# Patient Record
Sex: Female | Born: 1990 | Race: White | Hispanic: No | Marital: Single | Smoking: Current every day smoker
Health system: Southern US, Community
[De-identification: ages and names within clinical notes are randomized; demographics above are authoritative.]

## PROBLEM LIST (undated history)

## (undated) ENCOUNTER — Inpatient Hospital Stay (HOSPITAL_COMMUNITY): Payer: Self-pay

## (undated) DIAGNOSIS — Z9889 Other specified postprocedural states: Secondary | ICD-10-CM

## (undated) DIAGNOSIS — R112 Nausea with vomiting, unspecified: Secondary | ICD-10-CM

## (undated) DIAGNOSIS — N289 Disorder of kidney and ureter, unspecified: Secondary | ICD-10-CM

## (undated) DIAGNOSIS — R87629 Unspecified abnormal cytological findings in specimens from vagina: Secondary | ICD-10-CM

## (undated) DIAGNOSIS — F429 Obsessive-compulsive disorder, unspecified: Secondary | ICD-10-CM

## (undated) DIAGNOSIS — F319 Bipolar disorder, unspecified: Secondary | ICD-10-CM

## (undated) DIAGNOSIS — Z87442 Personal history of urinary calculi: Secondary | ICD-10-CM

## (undated) DIAGNOSIS — F32A Depression, unspecified: Secondary | ICD-10-CM

## (undated) DIAGNOSIS — D649 Anemia, unspecified: Secondary | ICD-10-CM

## (undated) DIAGNOSIS — F329 Major depressive disorder, single episode, unspecified: Secondary | ICD-10-CM

## (undated) DIAGNOSIS — F419 Anxiety disorder, unspecified: Secondary | ICD-10-CM

## (undated) HISTORY — DX: Unspecified abnormal cytological findings in specimens from vagina: R87.629

## (undated) HISTORY — DX: Anemia, unspecified: D64.9

---

## 2010-01-01 ENCOUNTER — Emergency Department (HOSPITAL_COMMUNITY): Admission: EM | Admit: 2010-01-01 | Discharge: 2010-01-01 | Payer: Self-pay | Admitting: Emergency Medicine

## 2010-01-09 ENCOUNTER — Emergency Department (HOSPITAL_COMMUNITY): Admission: EM | Admit: 2010-01-09 | Discharge: 2010-01-09 | Payer: Self-pay | Admitting: Emergency Medicine

## 2010-03-25 ENCOUNTER — Emergency Department (HOSPITAL_COMMUNITY): Admission: EM | Admit: 2010-03-25 | Discharge: 2010-03-25 | Payer: Self-pay | Admitting: Emergency Medicine

## 2010-11-30 LAB — URINALYSIS, ROUTINE W REFLEX MICROSCOPIC
Glucose, UA: NEGATIVE mg/dL
Leukocytes, UA: NEGATIVE
Nitrite: NEGATIVE
pH: 5.5 (ref 5.0–8.0)

## 2010-11-30 LAB — URINE MICROSCOPIC-ADD ON

## 2010-11-30 LAB — POCT PREGNANCY, URINE: Preg Test, Ur: NEGATIVE

## 2011-10-09 ENCOUNTER — Emergency Department (HOSPITAL_COMMUNITY)
Admission: EM | Admit: 2011-10-09 | Discharge: 2011-10-09 | Disposition: A | Discharge status: Home / Self Care | Payer: Self-pay | Attending: Emergency Medicine | Admitting: Emergency Medicine

## 2011-10-09 ENCOUNTER — Encounter (HOSPITAL_COMMUNITY): Payer: Self-pay

## 2011-10-09 DIAGNOSIS — F17200 Nicotine dependence, unspecified, uncomplicated: Secondary | ICD-10-CM | POA: Insufficient documentation

## 2011-10-09 DIAGNOSIS — F429 Obsessive-compulsive disorder, unspecified: Secondary | ICD-10-CM | POA: Insufficient documentation

## 2011-10-09 DIAGNOSIS — F329 Major depressive disorder, single episode, unspecified: Secondary | ICD-10-CM

## 2011-10-09 DIAGNOSIS — F3130 Bipolar disorder, current episode depressed, mild or moderate severity, unspecified: Secondary | ICD-10-CM | POA: Insufficient documentation

## 2011-10-09 DIAGNOSIS — F411 Generalized anxiety disorder: Secondary | ICD-10-CM | POA: Insufficient documentation

## 2011-10-09 DIAGNOSIS — F1210 Cannabis abuse, uncomplicated: Secondary | ICD-10-CM | POA: Insufficient documentation

## 2011-10-09 HISTORY — DX: Obsessive-compulsive disorder, unspecified: F42.9

## 2011-10-09 HISTORY — DX: Major depressive disorder, single episode, unspecified: F32.9

## 2011-10-09 HISTORY — DX: Anxiety disorder, unspecified: F41.9

## 2011-10-09 HISTORY — DX: Depression, unspecified: F32.A

## 2011-10-09 HISTORY — DX: Bipolar disorder, unspecified: F31.9

## 2011-10-09 LAB — DIFFERENTIAL
Basophils Absolute: 0 10*3/uL (ref 0.0–0.1)
Lymphocytes Relative: 21 % (ref 12–46)
Lymphs Abs: 2.8 10*3/uL (ref 0.7–4.0)
Monocytes Absolute: 0.7 10*3/uL (ref 0.1–1.0)
Neutro Abs: 9.4 10*3/uL — ABNORMAL HIGH (ref 1.7–7.7)

## 2011-10-09 LAB — RAPID URINE DRUG SCREEN, HOSP PERFORMED
Amphetamines: NOT DETECTED
Opiates: NOT DETECTED

## 2011-10-09 LAB — URINALYSIS, ROUTINE W REFLEX MICROSCOPIC
Protein, ur: NEGATIVE mg/dL
pH: 6 (ref 5.0–8.0)

## 2011-10-09 LAB — CBC
Hemoglobin: 14.9 g/dL (ref 12.0–15.0)
MCHC: 34.9 g/dL (ref 30.0–36.0)
MCV: 89.9 fL (ref 78.0–100.0)
Platelets: 337 10*3/uL (ref 150–400)
RBC: 4.75 MIL/uL (ref 3.87–5.11)
RDW: 13.2 % (ref 11.5–15.5)
WBC: 13.2 10*3/uL — ABNORMAL HIGH (ref 4.0–10.5)

## 2011-10-09 LAB — BASIC METABOLIC PANEL
BUN: 9 mg/dL (ref 6–23)
CO2: 26 mEq/L (ref 19–32)
GFR calc non Af Amer: >90 mL/min (ref >90)

## 2011-10-09 LAB — URINE MICROSCOPIC-ADD ON

## 2011-10-09 LAB — ETHANOL: Alcohol, Ethyl (B): <11 mg/dL (ref 0–11)

## 2011-10-09 MED ORDER — CITALOPRAM HYDROBROMIDE 20 MG PO TABS: ORAL_TABLET | ORAL | Status: DC

## 2011-10-09 MED ORDER — HYDROXYZINE HCL 25 MG PO TABS: ORAL_TABLET | ORAL | Status: DC

## 2011-10-09 NOTE — ED Notes (Signed)
Pt alert and cooperative. Denies any SI or HI at this time.  Pt denies needs or concerns at present time.

## 2011-10-09 NOTE — ED Notes (Signed)
Tele psych consult initiated

## 2011-10-09 NOTE — ED Notes (Signed)
Pt sitting in hall bed at this time.
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
## 2011-10-09 NOTE — ED Provider Notes (Signed)
Patient has a history of depression and bipolar disorder however she's not been on medications for a while. She relates a lot of stress in her life right now and she went to [facility] today to get help. She relates however that she vented and wasn't listening to what they were saying and they did IVC papers on her for possible suicidal ideation. Patient denies feeling suicidal or homicidal this time. She was initially tearful when she first arrived however when I talk to her she was alert cooperative she made good eye contact she seemed very reasonable. She relates she normally takes care of her grandmother and should be there now taking care of her. We are waiting for tele-psych evaluation at this time.  Medical screening examination/treatment/procedure(s) were conducted as a shared visit with non-physician practitioner(s) and myself.  I personally evaluated the patient during the encounter Devoria Albe, MD, Franz Dell, MD 10/09/11 [phone]

## 2011-10-09 NOTE — ED Provider Notes (Signed)
See prior note   Ward Givens, MD 10/09/11 2334
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
## 2011-10-09 NOTE — ED Notes (Signed)
Telespych consult completed.  

## 2012-04-27 ENCOUNTER — Emergency Department (HOSPITAL_COMMUNITY)
Admission: EM | Admit: 2012-04-27 | Discharge: 2012-04-27 | Disposition: A | Discharge status: Home / Self Care | Payer: Self-pay

## 2015-10-28 ENCOUNTER — Inpatient Hospital Stay (HOSPITAL_COMMUNITY)
Admission: EM | Admit: 2015-10-28 | Discharge: 2015-11-05 | DRG: 854 | Disposition: A | Discharge status: Home / Self Care | Payer: Self-pay | Attending: Internal Medicine | Admitting: Internal Medicine

## 2015-10-28 ENCOUNTER — Encounter (HOSPITAL_COMMUNITY): Payer: Self-pay | Admitting: *Deleted

## 2015-10-28 DIAGNOSIS — F319 Bipolar disorder, unspecified: Secondary | ICD-10-CM | POA: Diagnosis present

## 2015-10-28 DIAGNOSIS — L03317 Cellulitis of buttock: Secondary | ICD-10-CM | POA: Insufficient documentation

## 2015-10-28 DIAGNOSIS — Z87442 Personal history of urinary calculi: Secondary | ICD-10-CM

## 2015-10-28 DIAGNOSIS — T508X5A Adverse effect of diagnostic agents, initial encounter: Secondary | ICD-10-CM | POA: Diagnosis not present

## 2015-10-28 DIAGNOSIS — I959 Hypotension, unspecified: Secondary | ICD-10-CM | POA: Diagnosis present

## 2015-10-28 DIAGNOSIS — N141 Nephropathy induced by other drugs, medicaments and biological substances: Secondary | ICD-10-CM | POA: Diagnosis not present

## 2015-10-28 DIAGNOSIS — N179 Acute kidney failure, unspecified: Secondary | ICD-10-CM | POA: Diagnosis not present

## 2015-10-28 DIAGNOSIS — E876 Hypokalemia: Secondary | ICD-10-CM | POA: Diagnosis not present

## 2015-10-28 DIAGNOSIS — F429 Obsessive-compulsive disorder, unspecified: Secondary | ICD-10-CM | POA: Diagnosis present

## 2015-10-28 DIAGNOSIS — L0231 Cutaneous abscess of buttock: Secondary | ICD-10-CM | POA: Diagnosis present

## 2015-10-28 DIAGNOSIS — T368X5A Adverse effect of other systemic antibiotics, initial encounter: Secondary | ICD-10-CM | POA: Diagnosis not present

## 2015-10-28 DIAGNOSIS — T502X5A Adverse effect of carbonic-anhydrase inhibitors, benzothiadiazides and other diuretics, initial encounter: Secondary | ICD-10-CM | POA: Diagnosis present

## 2015-10-28 DIAGNOSIS — F17200 Nicotine dependence, unspecified, uncomplicated: Secondary | ICD-10-CM | POA: Diagnosis present

## 2015-10-28 DIAGNOSIS — B9620 Unspecified Escherichia coli [E. coli] as the cause of diseases classified elsewhere: Secondary | ICD-10-CM | POA: Diagnosis present

## 2015-10-28 DIAGNOSIS — A419 Sepsis, unspecified organism: Principal | ICD-10-CM | POA: Diagnosis present

## 2015-10-28 DIAGNOSIS — K612 Anorectal abscess: Secondary | ICD-10-CM | POA: Diagnosis present

## 2015-10-28 DIAGNOSIS — B952 Enterococcus as the cause of diseases classified elsewhere: Secondary | ICD-10-CM | POA: Diagnosis present

## 2015-10-28 HISTORY — DX: Disorder of kidney and ureter, unspecified: N28.9

## 2015-10-28 LAB — URINE MICROSCOPIC-ADD ON

## 2015-10-28 LAB — BASIC METABOLIC PANEL
ANION GAP: 12 (ref 5–15)
BUN: 10 mg/dL (ref 6–20)
CALCIUM: 8.8 mg/dL — AB (ref 8.9–10.3)
CO2: 25 mmol/L (ref 22–32)
Chloride: 99 mmol/L — ABNORMAL LOW (ref 101–111)
Creatinine, Ser: 0.63 mg/dL (ref 0.44–1.00)
GFR calc Af Amer: >60 mL/min (ref >60)
GFR calc non Af Amer: >60 mL/min (ref >60)
GLUCOSE: 108 mg/dL — AB (ref 65–99)
POTASSIUM: 3.3 mmol/L — AB (ref 3.5–5.1)
Sodium: 136 mmol/L (ref 135–145)

## 2015-10-28 LAB — CBC WITH DIFFERENTIAL/PLATELET
Basophils Absolute: 0 10*3/uL (ref 0.0–0.1)
Basophils Relative: 0 %
Eosinophils Absolute: 0.1 10*3/uL (ref 0.0–0.7)
Eosinophils Relative: 0 %
HEMATOCRIT: 38.1 % (ref 36.0–46.0)
Hemoglobin: 12.8 g/dL (ref 12.0–15.0)
LYMPHS ABS: 2.4 10*3/uL (ref 0.7–4.0)
LYMPHS PCT: 11 %
MCH: 29.7 pg (ref 26.0–34.0)
MCHC: 33.6 g/dL (ref 30.0–36.0)
MCV: 88.4 fL (ref 78.0–100.0)
MONO ABS: 2.1 10*3/uL — AB (ref 0.1–1.0)
MONOS PCT: 9 %
NEUTROS ABS: 17.6 10*3/uL — AB (ref 1.7–7.7)
Neutrophils Relative %: 80 %
Platelets: 318 10*3/uL (ref 150–400)
RBC: 4.31 MIL/uL (ref 3.87–5.11)
RDW: 12.9 % (ref 11.5–15.5)
WBC: 22.2 10*3/uL — ABNORMAL HIGH (ref 4.0–10.5)

## 2015-10-28 LAB — URINALYSIS, ROUTINE W REFLEX MICROSCOPIC
GLUCOSE, UA: NEGATIVE mg/dL
Nitrite: NEGATIVE
PH: 6 (ref 5.0–8.0)
Specific Gravity, Urine: 1.025 (ref 1.005–1.030)

## 2015-10-28 LAB — PREGNANCY, URINE: Preg Test, Ur: NEGATIVE

## 2015-10-28 NOTE — ED Provider Notes (Signed)
By signing my name below, I, Rachel Sellers, attest that this documentation has been prepared under the direction and in the presence of Rachel Aldaco N Montford Barg, DO. Electronically Signed: Gonzella Sellers, Scribe. 10/28/2015. 12:45 AM.   TIME SEEN: 12:40 AM  CHIEF COMPLAINT: Rectal pain   HPI: HPI Comments: Rachel Sellers is a 25 y.o. female who presents to the Emergency Department complaining of severe pain and swelling to the left buttock which began four days ago. Pt's boyfriend also notes that she  has had a subjective fever with associated nigh sweats, vomiting, and chills. Pt last drank fluids about two hours ago but has not eaten in two days. She also has not been able to have a bowel movement for the past two days and has had difficulty sleeping secondary to pain. Pt denies hx of DM and abscess and also denies abdominal pain and diarrhea. Pt denies use of medications at home and hx of immunocompromising disease. No history of IV drug abuse.  ROS: See HPI Constitutional: fever  Eyes: no drainage  ENT: no runny nose   Cardiovascular:  no chest pain  Resp: no SOB  GI: vomiting GU: no dysuria Integumentary: no rash  Allergy: no hives  Musculoskeletal: no leg swelling  Neurological: no slurred speech ROS otherwise negative  PAST MEDICAL HISTORY/PAST SURGICAL HISTORY:  Past Medical History  Diagnosis Date  . Bipolar 1 disorder (HCC)   . Anxiety   . OCD (obsessive compulsive disorder)   . Depression   . Renal disorder     kidney stones    MEDICATIONS:  Prior to Admission medications   Medication Sig Start Date End Date Taking? Authorizing Provider  citalopram (CELEXA) 20 MG tablet One tab po qam 10/09/11   Tammy Triplett, PA-C  hydrOXYzine (ATARAX/VISTARIL) 25 MG tablet One tab po BID prn anxiety 10/09/11   Tammy Triplett, PA-C    ALLERGIES:  No Known Allergies  SOCIAL HISTORY:  Social History  Substance Use Topics  . Smoking status: Current Every Day Smoker --  0.50 packs/day  . Smokeless tobacco: Not on file  . Alcohol Use: Yes    FAMILY HISTORY: No family history on file.  EXAM: BP 118/67 mmHg  Pulse 112  Temp(Src) 97.8 F (36.6 C) (Oral)  Resp 14  Ht  (1.575 m)  Wt 170 lb (77.111 kg)  BMI 31.09 kg/m2  SpO2 100%  LMP 08/20/2015 CONSTITUTIONAL: Alert and oriented and responds appropriately to questions. Appears very uncomfortable but non-toxic; well-nourished; temperature is 99.7 orally HEAD: Normocephalic EYES: Conjunctivae clear, PERRL ENT: normal nose; no rhinorrhea; moist mucous membranes; pharynx without lesions noted NECK: Supple, no meningismus, no LAD  CARD: Regular and tachycardic; S1 and S2 appreciated; no murmurs, no clicks, no rubs, no gallops RESP: Normal chest excursion without splinting or tachypnea; breath sounds clear and equal bilaterally; no wheezes, no rhonchi, no rales, no hypoxia or respiratory distress, speaking full sentences ABD/GI: Normal bowel sounds; non-distended; soft, non-tender, no rebound, no guarding, no peritoneal signs RECTAL: Pt has a 12 x 16 cm area of cellulitis and induration involving the left gluteal area towards the rectum with no obvious fluctuance or drainage and no crepitus or subcutaneous emphysema  BACK:  The back appears normal and is non-tender to palpation, there is no CVA tenderness EXT: Normal ROM in all joints; non-tender to palpation; no edema; normal capillary refill; no cyanosis, no calf tenderness or swelling    SKIN: Normal color for age and race; warm NEURO:  Moves all extremities equally, sensation to light touch intact diffusely, cranial nerves II through XII intact PSYCH: The patient's mood and manner are appropriate. Grooming and personal hygiene are appropriate.  MEDICAL DECISION MAKING: Patient here with likely a large gluteal abscess with cellulitis. There is no subcutaneous emphysema or crepitus on exam to suggest necrotizing fasciitis but given she is tachycardic,  hypotensive we'll start sepsis protocol, give IV fluids, broad-spectrum antibiotics and obtain CT scan with IV contrast. I feel she will need admission. She may need surgical drainage of this abscess.  ED PROGRESS: Patient has laxatives as of 22,000 with left shift. Lactate is normal. Cultures are pending. CT scan shows a 5.8 x 6.5 cm abscess in the subcutaneous fat over the left buttocks with extension into the left. No region. No other subcutaneous gas appreciated. Discussed with Dr. Lovell Sheehan with general surgery who will see the patient in consult and agrees that she will likely need to the operating room. I have performed aspiration of patient's abscess and removed approximately 60 mL of purulent drainage. Wound culture has been sent. Patient awaiting step down bed. Admitted by hospitalist Dr. Sharl Ma. Patient given IV fluids, vancomycin and Zosyn. Patient and family updated with plan.    INCISION AND DRAINAGE Performed by: Raelyn Number Consent: Verbal consent obtained. Risks and benefits: risks, benefits and alternatives were discussed Type: abscess  Body area: left buttock  Anesthesia: local infiltration  Incision was made with a scalpel.  Local anesthetic: lidocaine 2 % with epinephrine  Anesthetic total: 5 ml  Aspirated with 18 gauge needle.  Complexity: complex  Drainage: purulent  Drainage amount: 60 mL  Packing material:  none  Patient tolerance: Patient tolerated the procedure well with no immediate complications.    CRITICAL CARE Performed by: Raelyn Number   Total critical care time: 45 minutes  Critical care time was exclusive of separately billable procedures and treating other patients.  Critical care was necessary to treat or prevent imminent or life-threatening deterioration.  Critical care was time spent personally by me on the following activities: development of treatment plan with patient and/or surrogate as well as nursing, discussions with  consultants, evaluation of patient's response to treatment, examination of patient, obtaining history from patient or surrogate, ordering and performing treatments and interventions, ordering and review of laboratory studies, ordering and review of radiographic studies, pulse oximetry and re-evaluation of patient's condition.    I personally performed the services described in this documentation, which was scribed in my presence. The recorded information has been reviewed and is accurate.    Layla Maw Ece [place], DO 10/29/15 [phone]

## 2015-10-28 NOTE — ED Notes (Signed)
Pt comes in with swelling in left buttocks that started 4 days ago. Pts left buttocks is firm and tender in comparison to right side. Pt denies any drainage and no redness noted on outer buttocks.

## 2015-10-29 ENCOUNTER — Emergency Department (HOSPITAL_COMMUNITY): Payer: Self-pay

## 2015-10-29 DIAGNOSIS — L0231 Cutaneous abscess of buttock: Secondary | ICD-10-CM | POA: Diagnosis present

## 2015-10-29 DIAGNOSIS — A419 Sepsis, unspecified organism: Secondary | ICD-10-CM | POA: Diagnosis present

## 2015-10-29 DIAGNOSIS — L03317 Cellulitis of buttock: Secondary | ICD-10-CM | POA: Insufficient documentation

## 2015-10-29 LAB — COMPREHENSIVE METABOLIC PANEL
ALBUMIN: 2.8 g/dL — AB (ref 3.5–5.0)
ALK PHOS: 68 U/L (ref 38–126)
ALT: 38 U/L (ref 14–54)
ANION GAP: 8 (ref 5–15)
AST: 25 U/L (ref 15–41)
BILIRUBIN TOTAL: 0.5 mg/dL (ref 0.3–1.2)
BUN: 7 mg/dL (ref 6–20)
CALCIUM: 7.7 mg/dL — AB (ref 8.9–10.3)
CO2: 23 mmol/L (ref 22–32)
Chloride: 109 mmol/L (ref 101–111)
Creatinine, Ser: 0.43 mg/dL — ABNORMAL LOW (ref 0.44–1.00)
GFR calc Af Amer: >60 mL/min (ref >60)
GFR calc non Af Amer: >60 mL/min (ref >60)
GLUCOSE: 112 mg/dL — AB (ref 65–99)
Potassium: 3.8 mmol/L (ref 3.5–5.1)
SODIUM: 140 mmol/L (ref 135–145)
TOTAL PROTEIN: 6.2 g/dL — AB (ref 6.5–8.1)

## 2015-10-29 LAB — SURGICAL PCR SCREEN
MRSA, PCR: NEGATIVE
STAPHYLOCOCCUS AUREUS: NEGATIVE

## 2015-10-29 LAB — CBC
HEMATOCRIT: 31.8 % — AB (ref 36.0–46.0)
HEMOGLOBIN: 10.9 g/dL — AB (ref 12.0–15.0)
MCH: 30.4 pg (ref 26.0–34.0)
MCHC: 34.3 g/dL (ref 30.0–36.0)
MCV: 88.6 fL (ref 78.0–100.0)
Platelets: 277 10*3/uL (ref 150–400)
RBC: 3.59 MIL/uL — AB (ref 3.87–5.11)
RDW: 13.1 % (ref 11.5–15.5)
WBC: 16 10*3/uL — AB (ref 4.0–10.5)

## 2015-10-29 LAB — I-STAT CG4 LACTIC ACID, ED: LACTIC ACID, VENOUS: 1.35 mmol/L (ref 0.5–2.0)

## 2015-10-29 LAB — MRSA PCR SCREENING: MRSA by PCR: NEGATIVE

## 2015-10-29 MED ORDER — FENTANYL CITRATE (PF) 100 MCG/2ML IJ SOLN
50.0000 ug | Freq: Once | INTRAMUSCULAR | Status: AC
  Administered 2015-10-29: 50 ug via INTRAVENOUS
  Filled 2015-10-29: qty 2

## 2015-10-29 MED ORDER — MORPHINE SULFATE (PF) 4 MG/ML IV SOLN: 4.0000 mg | Freq: Once | INTRAVENOUS | Status: DC

## 2015-10-29 MED ORDER — POTASSIUM CHLORIDE CRYS ER 20 MEQ PO TBCR
40.0000 meq | EXTENDED_RELEASE_TABLET | Freq: Once | ORAL | Status: AC
  Administered 2015-10-29: 40 meq via ORAL
  Filled 2015-10-29: qty 2

## 2015-10-29 MED ORDER — IOHEXOL 300 MG/ML  SOLN
100.0000 mL | Freq: Once | INTRAMUSCULAR | Status: AC | PRN
  Administered 2015-10-29: 100 mL via INTRAVENOUS

## 2015-10-29 MED ORDER — CHLORHEXIDINE GLUCONATE 4 % EX LIQD
1.0000 "application " | Freq: Once | CUTANEOUS | Status: AC
  Administered 2015-10-29: 1 via TOPICAL
  Filled 2015-10-29: qty 15

## 2015-10-29 MED ORDER — ENOXAPARIN SODIUM 40 MG/0.4ML ~~LOC~~ SOLN
40.0000 mg | SUBCUTANEOUS | Status: DC
  Administered 2015-10-29 – 2015-11-02 (×4): 40 mg via SUBCUTANEOUS
  Filled 2015-10-29 (×4): qty 0.4

## 2015-10-29 MED ORDER — VANCOMYCIN HCL IN DEXTROSE 1-5 GM/200ML-% IV SOLN
1000.0000 mg | Freq: Once | INTRAVENOUS | Status: AC
  Administered 2015-10-29: 1000 mg via INTRAVENOUS
  Filled 2015-10-29: qty 200

## 2015-10-29 MED ORDER — ONDANSETRON HCL 4 MG/2ML IJ SOLN
4.0000 mg | Freq: Four times a day (QID) | INTRAMUSCULAR | Status: DC | PRN
  Administered 2015-10-29 – 2015-11-02 (×10): 4 mg via INTRAVENOUS
  Filled 2015-10-29 (×11): qty 2

## 2015-10-29 MED ORDER — ACETAMINOPHEN 500 MG PO TABS
1000.0000 mg | ORAL_TABLET | Freq: Once | ORAL | Status: AC
  Administered 2015-10-29: 1000 mg via ORAL
  Filled 2015-10-29: qty 2

## 2015-10-29 MED ORDER — HYDROMORPHONE HCL 1 MG/ML IJ SOLN
1.0000 mg | Freq: Once | INTRAMUSCULAR | Status: AC
  Administered 2015-10-29: 1 mg via INTRAVENOUS
  Filled 2015-10-29: qty 1

## 2015-10-29 MED ORDER — PIPERACILLIN-TAZOBACTAM 3.375 G IVPB
3.3750 g | Freq: Three times a day (TID) | INTRAVENOUS | Status: DC
  Filled 2015-10-29: qty 50

## 2015-10-29 MED ORDER — SODIUM CHLORIDE 0.9 % IV SOLN: INTRAVENOUS | Status: AC

## 2015-10-29 MED ORDER — SODIUM CHLORIDE 0.9 % IV SOLN
INTRAVENOUS | Status: DC
  Administered 2015-10-29: 09:00:00 via INTRAVENOUS

## 2015-10-29 MED ORDER — SODIUM CHLORIDE 0.9 % IV BOLUS (SEPSIS)
1000.0000 mL | Freq: Once | INTRAVENOUS | Status: AC
  Administered 2015-10-29: 1000 mL via INTRAVENOUS

## 2015-10-29 MED ORDER — ONDANSETRON HCL 4 MG PO TABS: 4.0000 mg | ORAL_TABLET | Freq: Four times a day (QID) | ORAL | Status: DC | PRN

## 2015-10-29 MED ORDER — ONDANSETRON HCL 4 MG/2ML IJ SOLN
4.0000 mg | Freq: Once | INTRAMUSCULAR | Status: AC
  Administered 2015-10-29: 4 mg via INTRAVENOUS
  Filled 2015-10-29: qty 2

## 2015-10-29 MED ORDER — PIPERACILLIN-TAZOBACTAM 3.375 G IVPB 30 MIN
3.3750 g | Freq: Once | INTRAVENOUS | Status: AC
  Administered 2015-10-29: 3.375 g via INTRAVENOUS
  Filled 2015-10-29: qty 50

## 2015-10-29 MED ORDER — VANCOMYCIN HCL IN DEXTROSE 1-5 GM/200ML-% IV SOLN
1000.0000 mg | Freq: Three times a day (TID) | INTRAVENOUS | Status: DC
  Administered 2015-10-29 – 2015-10-31 (×6): 1000 mg via INTRAVENOUS
  Filled 2015-10-29 (×5): qty 200

## 2015-10-29 MED ORDER — CITALOPRAM HYDROBROMIDE 20 MG PO TABS
20.0000 mg | ORAL_TABLET | Freq: Every day | ORAL | Status: DC
Start: 2015-10-29 — End: 2015-11-05
  Administered 2015-10-29 – 2015-11-05 (×8): 20 mg via ORAL
  Filled 2015-10-29 (×8): qty 1

## 2015-10-29 MED ORDER — MORPHINE SULFATE (PF) 2 MG/ML IV SOLN
2.0000 mg | INTRAVENOUS | Status: DC | PRN
Start: 2015-10-29 — End: 2015-10-30
  Administered 2015-10-29 – 2015-10-30 (×6): 2 mg via INTRAVENOUS
  Filled 2015-10-29 (×6): qty 1

## 2015-10-29 MED ORDER — PROMETHAZINE HCL 25 MG/ML IJ SOLN
12.5000 mg | Freq: Four times a day (QID) | INTRAMUSCULAR | Status: DC | PRN
  Administered 2015-10-29 – 2015-10-31 (×3): 12.5 mg via INTRAVENOUS
  Filled 2015-10-29 (×3): qty 1

## 2015-10-29 MED ORDER — LIDOCAINE-EPINEPHRINE (PF) 2 %-1:200000 IJ SOLN
20.0000 mL | Freq: Once | INTRAMUSCULAR | Status: AC
  Administered 2015-10-29: 20 mL via INTRADERMAL
  Filled 2015-10-29: qty 20

## 2015-10-29 NOTE — Progress Notes (Signed)
Patient seen and examined, chart reviewed, discussed with sister and boyfriend at bedside. Admitted with left buttock pain found to have on CT a gluteal abscess that extends into the perianal area. She underwent an I and D in the emergency department, has been seen by surgery with plans for further debridement in the OR tomorrow. Will DC Zosyn and continue vancomycin only for now. Hypotension has resolved. Will transfer to floor.  Peggye Pitt, MD [facility] Pager: [phone]

## 2015-10-29 NOTE — H&P (Signed)
PCP:   No primary care provider on file.   Chief Complaint:  Left buttock pain  HPI: 25 year old female who   has a past medical history of Bipolar 1 disorder (HCC); Anxiety; OCD (obsessive compulsive disorder); Depression; and Renal disorder. today presents to the hospital with pain and swelling in the left buttock which started 4 days ago. Patient also had some night sweats and nausea. Denies fever. Hasn't had bowel movement for past 2 days and also had difficulty sleeping due to pain. Patient says that she had a small boil noted at the same area last month which had resolved of its own.   Allergies:  No Known Allergies    Past Medical History  Diagnosis Date  . Bipolar 1 disorder (HCC)   . Anxiety   . OCD (obsessive compulsive disorder)   . Depression   . Renal disorder     kidney stones    History reviewed. No pertinent past surgical history.  Prior to Admission medications   Medication Sig Start Date End Date Taking? Authorizing Provider  citalopram (CELEXA) 20 MG tablet One tab po qam 10/09/11   Tammy Triplett, PA-C  hydrOXYzine (ATARAX/VISTARIL) 25 MG tablet One tab po BID prn anxiety 10/09/11   Tammy Triplett, PA-C    Social History:  reports that she has been smoking.  She does not have any smokeless tobacco history on file. She reports that she drinks alcohol. She reports that she does not use illicit drugs.  No family history on file.  Filed Weights   10/28/15 1749  Weight: 77.111 kg (170 lb)    All the positives are listed in BOLD  Review of Systems:  HEENT: Headache, blurred vision, runny nose, sore throat Neck: Hypothyroidism, hyperthyroidism,,lymphadenopathy Chest : Shortness of breath, history of COPD, Asthma Heart : Chest pain, history of coronary arterey disease GI:  Nausea, vomiting, diarrhea, constipation, GERD GU: Dysuria, urgency, frequency of urination, hematuria Neuro: Stroke, seizures, syncope Psych: Depression, anxiety,  hallucinations   Physical Exam: Blood pressure 95/58, pulse 85, temperature 98.2 F (36.8 C), temperature source Oral, resp. rate 17, height  (1.575 m), weight 77.111 kg (170 lb), last menstrual period 08/20/2015, SpO2 97 %. Constitutional:   Patient is a well-developed and well-nourished  Female in no acute distress and cooperative with exam. Head: Normocephalic and atraumatic Mouth: Mucus membranes moist Eyes: PERRL, EOMI, conjunctivae normal Neck: Supple, No Thyromegaly Cardiovascular: RRR, S1 normal, S2 normal Pulmonary/Chest: CTAB, no wheezes, rales, or rhonchi Abdominal: Soft. Non-tender, non-distended, bowel sounds are normal, no masses, organomegaly, or guarding present.  Neurological: A&O x3, Strength is normal and symmetric bilaterally, cranial nerve II-XII are grossly intact, no focal motor deficit, sensory intact to light touch bilaterally.  Extremities : No Cyanosis, Clubbing or Edema Buttock- induration and mild erythema noted in the left buttock area extending up to the anus  Labs on Admission:  Basic Metabolic Panel:  Recent Labs Lab 10/28/15 2230  NA 136  K 3.3*  CL 99*  CO2 25  GLUCOSE 108*  BUN 10  CREATININE 0.63  CALCIUM 8.8*   CBC:  Recent Labs Lab 10/28/15 2230  WBC 22.2*  NEUTROABS 17.6*  HGB 12.8  HCT 38.1  MCV 88.4  PLT 318    Radiological Exams on Admission: Ct Abdomen Pelvis W Contrast  10/29/2015  CLINICAL DATA:  Sudden onset of edema in the left buttock beginning 4 days ago. Fever with night sweats, vomiting, and chills. No bowel movement for 2 days. EXAM:  CT ABDOMEN AND PELVIS WITH CONTRAST TECHNIQUE: Multidetector CT imaging of the abdomen and pelvis was performed using the standard protocol following bolus administration of intravenous contrast. CONTRAST:  OMNIPAQUE IOHEXOL 300 MG/ML  SOLN COMPARISON:  03/25/2010 FINDINGS: Lung bases are clear. The liver, spleen, gallbladder, pancreas, adrenal glands, kidneys, abdominal  aorta, inferior vena cava, and retroperitoneal lymph nodes are unremarkable. Stomach, small bowel, and colon are not abnormally distended. No free air or free fluid in the abdomen. Abdominal wall musculature appears intact. Pelvis: The appendix is normal. Uterus and ovaries are not enlarged. No free or loculated pelvic fluid collections. No pelvic mass or lymphadenopathy. Bladder wall is not thickened. There is a complex gas and fluid collection in the subcutaneous soft tissues extending from the left perianal region posteriorly to the left buttocks. This is consistent with abscess. The abscess measures about 5.8 x 6.5 cm. There is associated infiltration in the subcutaneous fat over the left buttocks consistent with cellulitis. No destructive bone lesions. IMPRESSION: 5.8 x 6.5 cm abscess in the subcutaneous fat over the left buttocks centrally with extension to the left perianal region. No acute process demonstrated internally within the abdomen or pelvis. Electronically Signed   By: Burman Nieves M.D.   On: 10/29/2015 02:40       Assessment/Plan Active Problems:   Sepsis (HCC)   Left buttock abscess   Sepsis due to the left buttock abscess Admit the patient was turned on, started vancomycin and Zosyn in the ED. Will continue with vancomycin and Zosyn per pharmacy consultation. Gen. surgery has been consulted and plan is to take patient to operating room  in the a.m. Continue morphine 2 mg every 4 hours when necessary for pain.   Code status: Full code  Family discussion: Admission, patients condition and plan of care including tests being ordered have been discussed with the patient and her friend at bedside who indicate understanding and agree with the plan and Code Status.   Time Spent on Admission: 55 min  Schylar Allard S [facility] Pager: [phone] 10/29/2015, 3:58 AM  If 7PM-7AM, please contact night-coverage  [email or web address]  Password [email or web address]

## 2015-10-29 NOTE — Consult Note (Signed)
Reason for Consult: Left buttock abscess Referring Physician: Hospitalist  Rachel Sellers is an 25 y.o. female.  HPI: Patient is a 25 year old white female who presents with a one-month history of worsening left buttock pain. She presented to the emergency room was noted to have a significantly elevated white blood cell count. CT scan of the abdomen reveals a left buttock abscess with extension to the perianal region. 60 mL of purulent fluid was aspirated under ultrasound guidance from this area. The patient was placed in the intensive care unit due to mild hypotension.  Past Medical History  Diagnosis Date  . Bipolar 1 disorder ([place])   . Anxiety   . OCD (obsessive compulsive disorder)   . Depression   . Renal disorder     kidney stones    History reviewed. No pertinent past surgical history.  No family history on file.  Social History:  reports that she has been smoking.  She does not have any smokeless tobacco history on file. She reports that she drinks alcohol. She reports that she does not use illicit drugs.  Allergies: No Known Allergies  Medications: I have reviewed the patient's current medications.  Results for orders placed or performed during the hospital encounter of 10/28/15 (from the past 48 hour(s))  Basic metabolic panel     Status: Abnormal   Collection Time: 10/28/15 10:30 PM  Result Value Ref Range   Sodium 136 135 - 145 mmol/L   Potassium 3.3 (L) 3.5 - 5.1 mmol/L   Chloride 99 (L) 101 - 111 mmol/L   CO2 25 22 - 32 mmol/L   Glucose, Bld 108 (H) 65 - 99 mg/dL   BUN 10 6 - 20 mg/dL   Creatinine, Ser 0.63 0.44 - 1.00 mg/dL   Calcium 8.8 (L) 8.9 - 10.3 mg/dL   GFR calc non Af Amer >60 >60 mL/min   GFR calc Af Amer >60 >60 mL/min    Comment: (NOTE) The eGFR has been calculated using the CKD EPI equation. This calculation has not been validated in all clinical situations. eGFR's persistently <60 mL/min signify possible Chronic Kidney Disease.    Anion gap  12 5 - 15  CBC with Differential     Status: Abnormal   Collection Time: 10/28/15 10:30 PM  Result Value Ref Range   WBC 22.2 (H) 4.0 - 10.5 K/uL   RBC 4.31 3.87 - 5.11 MIL/uL   Hemoglobin 12.8 12.0 - 15.0 g/dL   HCT 38.1 36.0 - 46.0 %   MCV 88.4 78.0 - 100.0 fL   MCH 29.7 26.0 - 34.0 pg   MCHC 33.6 30.0 - 36.0 g/dL   RDW 12.9 11.5 - 15.5 %   Platelets 318 150 - 400 K/uL   Neutrophils Relative % 80 %   Neutro Abs 17.6 (H) 1.7 - 7.7 K/uL   Lymphocytes Relative 11 %   Lymphs Abs 2.4 0.7 - 4.0 K/uL   Monocytes Relative 9 %   Monocytes Absolute 2.1 (H) 0.1 - 1.0 K/uL   Eosinophils Relative 0 %   Eosinophils Absolute 0.1 0.0 - 0.7 K/uL   Basophils Relative 0 %   Basophils Absolute 0.0 0.0 - 0.1 K/uL  Pregnancy, urine     Status: None   Collection Time: 10/28/15 11:00 PM  Result Value Ref Range   Preg Test, Ur NEGATIVE NEGATIVE    Comment:        THE SENSITIVITY OF THIS METHODOLOGY IS >20 mIU/mL.   Urinalysis, Routine w reflex  microscopic     Status: Abnormal   Collection Time: 10/28/15 11:00 PM  Result Value Ref Range   Color, Urine AMBER (A) YELLOW    Comment: BIOCHEMICALS MAY BE AFFECTED BY COLOR   APPearance HAZY (A) CLEAR   Specific Gravity, Urine 1.025 1.005 - 1.030   pH 6.0 5.0 - 8.0   Glucose, UA NEGATIVE NEGATIVE mg/dL   Hgb urine dipstick TRACE (A) NEGATIVE   Bilirubin Urine SMALL (A) NEGATIVE   Ketones, ur >80 (A) NEGATIVE mg/dL   Protein, ur TRACE (A) NEGATIVE mg/dL   Nitrite NEGATIVE NEGATIVE   Leukocytes, UA LARGE (A) NEGATIVE  Urine microscopic-add on     Status: Abnormal   Collection Time: 10/28/15 11:00 PM  Result Value Ref Range   Squamous Epithelial / LPF TOO NUMEROUS TO COUNT (A) NONE SEEN   WBC, UA TOO NUMEROUS TO COUNT 0 - 5 WBC/hpf   RBC / HPF 0-5 0 - 5 RBC/hpf   Bacteria, UA MANY (A) NONE SEEN   Urine-Other MUCOUS PRESENT   Blood culture (routine x 2)     Status: None (Preliminary result)   Collection Time: 10/29/15  1:00 AM  Result Value Ref  Range   Specimen Description BLOOD LEFT ANTECUBITAL    Special Requests      BOTTLES DRAWN AEROBIC AND ANAEROBIC AEB 8CC ANA 5CC   Culture PENDING    Report Status PENDING   I-Stat CG4 Lactic Acid, ED     Status: None   Collection Time: 10/29/15  1:04 AM  Result Value Ref Range   Lactic Acid, Venous 1.35 0.5 - 2.0 mmol/L  Blood culture (routine x 2)     Status: None (Preliminary result)   Collection Time: 10/29/15  1:12 AM  Result Value Ref Range   Specimen Description BLOOD LEFT HAND    Special Requests      BOTTLES DRAWN AEROBIC AND ANAEROBIC AEB 6CC ANA 4CC   Culture PENDING    Report Status PENDING     Ct Abdomen Pelvis W Contrast  10/29/2015  CLINICAL DATA:  Sudden onset of edema in the left buttock beginning 4 days ago. Fever with night sweats, vomiting, and chills. No bowel movement for 2 days. EXAM: CT ABDOMEN AND PELVIS WITH CONTRAST TECHNIQUE: Multidetector CT imaging of the abdomen and pelvis was performed using the standard protocol following bolus administration of intravenous contrast. CONTRAST:  170m OMNIPAQUE IOHEXOL 300 MG/ML  SOLN COMPARISON:  03/25/2010 FINDINGS: Lung bases are clear. The liver, spleen, gallbladder, pancreas, adrenal glands, kidneys, abdominal aorta, inferior vena cava, and retroperitoneal lymph nodes are unremarkable. Stomach, small bowel, and colon are not abnormally distended. No free air or free fluid in the abdomen. Abdominal wall musculature appears intact. Pelvis: The appendix is normal. Uterus and ovaries are not enlarged. No free or loculated pelvic fluid collections. No pelvic mass or lymphadenopathy. Bladder wall is not thickened. There is a complex gas and fluid collection in the subcutaneous soft tissues extending from the left perianal region posteriorly to the left buttocks. This is consistent with abscess. The abscess measures about 5.8 x 6.5 cm. There is associated infiltration in the subcutaneous fat over the left buttocks consistent with  cellulitis. No destructive bone lesions. IMPRESSION: 5.8 x 6.5 cm abscess in the subcutaneous fat over the left buttocks centrally with extension to the left perianal region. No acute process demonstrated internally within the abdomen or pelvis. Electronically Signed   By: WLucienne CapersM.D.   On: 10/29/2015 02:40  ROS: See chart Blood pressure 105/67, pulse 92, temperature 98 F (36.7 C), temperature source Oral, resp. rate 16, height _0  (1.575 m), weight 77.111 kg (170 lb), last menstrual period 08/20/2015, SpO2 98 %. Physical Exam: Pleasant white female who appears uncomfortable and lying on her side. Rectal examination was limited secondary to pain. She has a significantly indurated and erythematous area in the left buttock. I could not determine the extent to the perianal region.  Assessment/Plan: Impression: Left buttock/perianal abscess Plan: Once patient has stabilized, we'll take patient to the operating room tomorrow for incision and drainage of her left perirectal abscess.  Benna Arno A 10/29/2015, 8:46 AM

## 2015-10-29 NOTE — Progress Notes (Signed)
Pharmacy Antibiotic Note  Rachel Sellers is a 25 y.o. female admitted on 10/28/2015 with cellulitis / left buttock abscess.  Pharmacy has been consulted for Vancomycin and Zosyn dosing.  Plan: Vancomycin 1gm IV every 8 hours.  Goal trough 10-15 mcg/mL. Zosyn 3.375g IV q8h (4 hour infusion).  Height:  (157.5 cm) Weight: 170 lb (77.111 kg) IBW/kg (Calculated) : 50.1  Temp (24hrs), Avg:98.4 F (36.9 C), Min:97.8 F (36.6 C), Max:99.7 F (37.6 C)   Recent Labs Lab 10/28/15 2230 10/29/15 0104  WBC 22.2*  --   CREATININE 0.63  --   LATICACIDVEN  --  1.35    Estimated Creatinine Clearance: 103.4 mL/min (by C-G formula based on Cr of 0.63).    No Known Allergies  Antimicrobials this admission: Vancomycin 2/14 >>  Zosyn 2/14 >>   Dose adjustments this admission: N/A  Microbiology results: 2/14 BCx: pending 2/13 UCx: pending  2/14 Wound: pending   Thank you for allowing pharmacy to be a part of this patient's care.  Mady Gemma 10/29/2015 8:50 AM

## 2015-10-30 ENCOUNTER — Encounter (HOSPITAL_COMMUNITY): Payer: Self-pay | Admitting: *Deleted

## 2015-10-30 ENCOUNTER — Inpatient Hospital Stay (HOSPITAL_COMMUNITY): Payer: Self-pay | Admitting: Anesthesiology

## 2015-10-30 ENCOUNTER — Encounter (HOSPITAL_COMMUNITY)
Admission: EM | Disposition: A | Discharge status: Home / Self Care | Payer: Self-pay | Source: Home / Self Care | Attending: Internal Medicine

## 2015-10-30 DIAGNOSIS — L03317 Cellulitis of buttock: Secondary | ICD-10-CM

## 2015-10-30 DIAGNOSIS — L0231 Cutaneous abscess of buttock: Secondary | ICD-10-CM

## 2015-10-30 HISTORY — PX: INCISION AND DRAINAGE ABSCESS: SHX5864

## 2015-10-30 LAB — BASIC METABOLIC PANEL
ANION GAP: 7 (ref 5–15)
BUN: 5 mg/dL — ABNORMAL LOW (ref 6–20)
CHLORIDE: 107 mmol/L (ref 101–111)
CO2: 26 mmol/L (ref 22–32)
Calcium: 8.4 mg/dL — ABNORMAL LOW (ref 8.9–10.3)
Creatinine, Ser: 0.47 mg/dL (ref 0.44–1.00)
GFR calc non Af Amer: >60 mL/min (ref >60)
Glucose, Bld: 106 mg/dL — ABNORMAL HIGH (ref 65–99)
POTASSIUM: 3.7 mmol/L (ref 3.5–5.1)
SODIUM: 140 mmol/L (ref 135–145)

## 2015-10-30 LAB — URINE CULTURE

## 2015-10-30 LAB — VANCOMYCIN, TROUGH: Vancomycin Tr: 8 ug/mL — ABNORMAL LOW (ref 10.0–20.0)

## 2015-10-30 SURGERY — INCISION AND DRAINAGE, ABSCESS
Anesthesia: General | Laterality: Left

## 2015-10-30 MED ORDER — NEOSTIGMINE METHYLSULFATE 10 MG/10ML IV SOLN
INTRAVENOUS | Status: DC | PRN
Start: 2015-10-30 — End: 2015-10-30
  Administered 2015-10-30: 1 mg via INTRAVENOUS
  Administered 2015-10-30: 3 mg via INTRAVENOUS

## 2015-10-30 MED ORDER — LACTATED RINGERS IV SOLN
INTRAVENOUS | Status: DC
  Administered 2015-10-30 (×2): 1000 mL via INTRAVENOUS

## 2015-10-30 MED ORDER — SODIUM CHLORIDE 0.9 % IR SOLN
Status: DC | PRN
  Administered 2015-10-30: 1000 mL

## 2015-10-30 MED ORDER — MIDAZOLAM HCL 2 MG/2ML IJ SOLN
INTRAMUSCULAR | Status: AC
  Filled 2015-10-30: qty 2

## 2015-10-30 MED ORDER — ONDANSETRON HCL 4 MG/2ML IJ SOLN: 4.0000 mg | Freq: Once | INTRAMUSCULAR | Status: DC | PRN

## 2015-10-30 MED ORDER — FENTANYL CITRATE (PF) 100 MCG/2ML IJ SOLN
25.0000 ug | INTRAMUSCULAR | Status: DC | PRN
  Administered 2015-10-30 (×4): 50 ug via INTRAVENOUS
  Filled 2015-10-30 (×2): qty 2

## 2015-10-30 MED ORDER — SIMETHICONE 80 MG PO CHEW
40.0000 mg | CHEWABLE_TABLET | Freq: Four times a day (QID) | ORAL | Status: DC | PRN
  Filled 2015-10-30: qty 1

## 2015-10-30 MED ORDER — LIDOCAINE HCL (PF) 1 % IJ SOLN
INTRAMUSCULAR | Status: AC
  Filled 2015-10-30: qty 5

## 2015-10-30 MED ORDER — GLYCOPYRROLATE 0.2 MG/ML IJ SOLN
INTRAMUSCULAR | Status: AC
  Filled 2015-10-30: qty 2

## 2015-10-30 MED ORDER — KETOROLAC TROMETHAMINE 30 MG/ML IJ SOLN
30.0000 mg | Freq: Once | INTRAMUSCULAR | Status: AC
  Administered 2015-10-30: 30 mg via INTRAVENOUS
  Filled 2015-10-30: qty 1

## 2015-10-30 MED ORDER — GLYCOPYRROLATE 0.2 MG/ML IJ SOLN
INTRAMUSCULAR | Status: DC | PRN
  Administered 2015-10-30: 0.4 mg via INTRAVENOUS
  Administered 2015-10-30: 0.2 mg via INTRAVENOUS

## 2015-10-30 MED ORDER — PROPOFOL 10 MG/ML IV BOLUS
INTRAVENOUS | Status: AC
  Filled 2015-10-30: qty 20

## 2015-10-30 MED ORDER — FENTANYL CITRATE (PF) 100 MCG/2ML IJ SOLN
INTRAMUSCULAR | Status: AC
  Filled 2015-10-30: qty 2

## 2015-10-30 MED ORDER — OXYCODONE-ACETAMINOPHEN 5-325 MG PO TABS
1.0000 | ORAL_TABLET | ORAL | Status: DC | PRN
Start: 2015-10-30 — End: 2015-10-31
  Administered 2015-10-30: 2 via ORAL
  Administered 2015-10-30: 1 via ORAL
  Administered 2015-10-30: 2 via ORAL
  Filled 2015-10-30 (×4): qty 2

## 2015-10-30 MED ORDER — FENTANYL CITRATE (PF) 100 MCG/2ML IJ SOLN
INTRAMUSCULAR | Status: DC | PRN
  Administered 2015-10-30 (×2): 50 ug via INTRAVENOUS

## 2015-10-30 MED ORDER — ROCURONIUM BROMIDE 100 MG/10ML IV SOLN
INTRAVENOUS | Status: DC | PRN
  Administered 2015-10-30: 5 mg via INTRAVENOUS
  Administered 2015-10-30: 25 mg via INTRAVENOUS

## 2015-10-30 MED ORDER — LORAZEPAM 2 MG/ML IJ SOLN
1.0000 mg | INTRAMUSCULAR | Status: DC | PRN
  Administered 2015-10-30: 1 mg via INTRAVENOUS
  Filled 2015-10-30: qty 1

## 2015-10-30 MED ORDER — LACTATED RINGERS IV SOLN
INTRAVENOUS | Status: DC
  Administered 2015-10-30: 22:00:00 via INTRAVENOUS

## 2015-10-30 MED ORDER — ACETAMINOPHEN 650 MG RE SUPP
650.0000 mg | Freq: Four times a day (QID) | RECTAL | Status: DC | PRN
Start: 2015-10-30 — End: 2015-11-05

## 2015-10-30 MED ORDER — BUPIVACAINE HCL (PF) 0.5 % IJ SOLN
INTRAMUSCULAR | Status: AC
  Filled 2015-10-30: qty 30

## 2015-10-30 MED ORDER — ONDANSETRON HCL 4 MG/2ML IJ SOLN
4.0000 mg | Freq: Once | INTRAMUSCULAR | Status: AC
  Administered 2015-10-30: 4 mg via INTRAVENOUS

## 2015-10-30 MED ORDER — PIPERACILLIN-TAZOBACTAM 3.375 G IVPB
3.3750 g | Freq: Three times a day (TID) | INTRAVENOUS | Status: DC
  Administered 2015-10-30 – 2015-11-01 (×7): 3.375 g via INTRAVENOUS
  Filled 2015-10-30 (×6): qty 50

## 2015-10-30 MED ORDER — HYDROMORPHONE HCL 1 MG/ML IJ SOLN
1.0000 mg | INTRAMUSCULAR | Status: DC | PRN
  Administered 2015-10-31 (×2): 1 mg via INTRAVENOUS
  Filled 2015-10-30 (×2): qty 1

## 2015-10-30 MED ORDER — ACETAMINOPHEN 325 MG PO TABS: 650.0000 mg | ORAL_TABLET | Freq: Four times a day (QID) | ORAL | Status: DC | PRN

## 2015-10-30 MED ORDER — PROPOFOL 10 MG/ML IV BOLUS
INTRAVENOUS | Status: DC | PRN
  Administered 2015-10-30 (×2): 50 mg via INTRAVENOUS
  Administered 2015-10-30: 150 mg via INTRAVENOUS

## 2015-10-30 MED ORDER — MIDAZOLAM HCL 2 MG/2ML IJ SOLN
1.0000 mg | INTRAMUSCULAR | Status: DC | PRN
  Administered 2015-10-30 (×2): 2 mg via INTRAVENOUS

## 2015-10-30 MED ORDER — LIDOCAINE HCL (CARDIAC) 10 MG/ML IV SOLN
INTRAVENOUS | Status: DC | PRN
  Administered 2015-10-30: 50 mg via INTRAVENOUS

## 2015-10-30 MED ORDER — ONDANSETRON HCL 4 MG/2ML IJ SOLN
INTRAMUSCULAR | Status: AC
  Filled 2015-10-30: qty 2

## 2015-10-30 MED ORDER — GLYCOPYRROLATE 0.2 MG/ML IJ SOLN
INTRAMUSCULAR | Status: AC
  Filled 2015-10-30: qty 1

## 2015-10-30 SURGICAL SUPPLY — 29 items
BAG HAMPER (MISCELLANEOUS) ×3 IMPLANT
BANDAGE ELASTIC 4 VELCRO NS (GAUZE/BANDAGES/DRESSINGS) ×3 IMPLANT
BNDG CONFORM 2 STRL LF (GAUZE/BANDAGES/DRESSINGS) IMPLANT
CLOTH BEACON ORANGE TIMEOUT ST (SAFETY) ×3 IMPLANT
COVER LIGHT HANDLE STERIS (MISCELLANEOUS) ×6 IMPLANT
ELECT REM PT RETURN 9FT ADLT (ELECTROSURGICAL) ×3
ELECTRODE REM PT RTRN 9FT ADLT (ELECTROSURGICAL) ×1 IMPLANT
GAUZE PACKING 2X5 YD STERILE (GAUZE/BANDAGES/DRESSINGS) ×3 IMPLANT
GAUZE SPONGE 4X4 12PLY STRL (GAUZE/BANDAGES/DRESSINGS) IMPLANT
GLOVE BIOGEL M STRL SZ7.5 (GLOVE) ×3 IMPLANT
GLOVE BIOGEL PI IND STRL 7.0 (GLOVE) ×1 IMPLANT
GLOVE BIOGEL PI INDICATOR 7.0 (GLOVE) ×2
GLOVE ECLIPSE 7.0 STRL STRAW (GLOVE) ×3 IMPLANT
GLOVE INDICATOR 7.0 STRL GRN (GLOVE) ×3 IMPLANT
GLOVE SURG SS PI 7.5 STRL IVOR (GLOVE) ×3 IMPLANT
GOWN STRL REUS W/TWL LRG LVL3 (GOWN DISPOSABLE) ×6 IMPLANT
KIT ROOM TURNOVER APOR (KITS) ×3 IMPLANT
MANIFOLD NEPTUNE II (INSTRUMENTS) ×3 IMPLANT
MARKER SKIN DUAL TIP RULER LAB (MISCELLANEOUS) ×3 IMPLANT
NS IRRIG 1000ML POUR BTL (IV SOLUTION) ×3 IMPLANT
PACK BASIC LIMB (CUSTOM PROCEDURE TRAY) IMPLANT
PACK MINOR (CUSTOM PROCEDURE TRAY) ×3 IMPLANT
PAD ABD 5X9 TENDERSORB (GAUZE/BANDAGES/DRESSINGS) ×6 IMPLANT
PAD ARMBOARD 7.5X6 YLW CONV (MISCELLANEOUS) ×3 IMPLANT
SET BASIN LINEN APH (SET/KITS/TRAYS/PACK) ×3 IMPLANT
SHEET LAVH (DRAPES) ×3 IMPLANT
SWAB CULTURE ESWAB REG 1ML (MISCELLANEOUS) ×3 IMPLANT
SWAB CULTURE LIQ STUART DBL (MISCELLANEOUS) ×3 IMPLANT
SYR BULB IRRIGATION 50ML (SYRINGE) ×3 IMPLANT

## 2015-10-30 NOTE — Op Note (Signed)
Patient:  Rachel Sellers  DOB:  October 16, 1990  MRN:  [id number]   Preop Diagnosis:  Left buttock/perirectal abscess  Postop Diagnosis:  Same  Procedure:  Incision and drainage of perirectal abscess  Surgeon:  Franky Macho, M.D.  Anes:  Gen.  Indications:  Patient is a 25 year old white female who presents with a large perirectal abscess which extends into the left buttock. The risks and benefits of the procedure including bleeding, infection, and recurrence of the abscess were fully explained to the patient, who gave informed consent.  Procedure note:  The patient was placed in the lithotomy position after general anesthesia was administered. The perineum was prepped and draped using the usual sterile technique with Betadine. Surgical site confirmation was performed.  On examination, a large fluctuant area was noted extending from the left perianal region into the left buttock. A previous aspiration needle site was emanating posterior was also purulent drainage from the dentate line at the 6:00 position. An incision was made to the previous needle aspiration site and a large amount of purulent fluid was evacuated. This extended into the left buttock soft tissue as well as along the left perirectal area up to the pelvic bone. Digital exploration broke up any septated areas. Aerobic and anaerobic cultures were taken and sent to microbiology. The cavity was copiously irrigated with normal saline. The cavity was packed with Betadine impregnated gauze.  All tape and needle counts were correct at the end of the procedure. Patient was awakened and transferred to PACU in stable condition.    Complications:  None  EBL:  Minimal  Specimen:  Aerobic and anaerobic cultures of left perirectal abscess

## 2015-10-30 NOTE — Anesthesia Postprocedure Evaluation (Signed)
Anesthesia Post Note  Patient: Rachel Sellers  Procedure(s) Performed: Procedure(s) (LRB): INCISION AND DRAINAGE PERI-RECTAL ABSCESS (Left)  Patient location during evaluation: PACU Anesthesia Type: General Level of consciousness: awake and alert Pain management: satisfactory to patient Vital Signs Assessment: post-procedure vital signs reviewed and stable Respiratory status: spontaneous breathing Cardiovascular status: blood pressure returned to baseline Anesthetic complications: no    Last Vitals:  Filed Vitals:   10/30/15 0900 10/30/15 0910  BP: 110/62   Pulse: 79 83  Temp:    Resp: 24 27    Last Pain:  Filed Vitals:   10/30/15 0911  PainSc: 3                  Breianna Delfino

## 2015-10-30 NOTE — Transfer of Care (Signed)
Immediate Anesthesia Transfer of Care Note  Patient: Rachel Sellers  Procedure(s) Performed: Procedure(s): INCISION AND DRAINAGE ABSCESS (Left)  Patient Location: PACU  Anesthesia Type:General  Level of Consciousness: awake and patient cooperative  Airway & Oxygen Therapy: Patient Spontanous Breathing  Post-op Assessment: Report given to RN, Post -op Vital signs reviewed and stable and Patient moving all extremities  Post vital signs: Reviewed and stable    Complications: No apparent anesthesia complications

## 2015-10-30 NOTE — Anesthesia Preprocedure Evaluation (Signed)
Anesthesia Evaluation  Patient identified by MRN, date of birth, ID band Patient awake    Reviewed: Allergy & Precautions, NPO status , Patient's Chart, lab work & pertinent test results  Airway Mallampati: II  TM Distance: >3 FB Neck ROM: Full    Dental  (+) Teeth Intact, Dental Advisory Given   Pulmonary Current Smoker,    breath sounds clear to auscultation       Cardiovascular negative cardio ROS   Rhythm:Regular Rate:Normal     Neuro/Psych PSYCHIATRIC DISORDERS (OCD) Anxiety Depression Bipolar Disorder    GI/Hepatic negative GI ROS,   Endo/Other    Renal/GU      Musculoskeletal   Abdominal   Peds  Hematology   Anesthesia Other Findings   Reproductive/Obstetrics                             Anesthesia Physical Anesthesia Plan  ASA: II  Anesthesia Plan: General   Post-op Pain Management:    Induction: Intravenous, Rapid sequence and Cricoid pressure planned  Airway Management Planned: Oral ETT  Additional Equipment:   Intra-op Plan:   Post-operative Plan: Extubation in OR  Informed Consent: I have reviewed the patients History and Physical, chart, labs and discussed the procedure including the risks, benefits and alternatives for the proposed anesthesia with the patient or authorized representative who has indicated his/her understanding and acceptance.     Plan Discussed with:   Anesthesia Plan Comments:         Anesthesia Quick Evaluation

## 2015-10-30 NOTE — Anesthesia Postprocedure Evaluation (Signed)
Anesthesia Post Note  Patient: Rachel Sellers  Procedure(s) Performed: Procedure(s) (LRB): INCISION AND DRAINAGE ABSCESS (Left)  Patient location during evaluation: PACU Anesthesia Type: General Level of consciousness: awake and alert Pain management: satisfactory to patient Vital Signs Assessment: post-procedure vital signs reviewed and stable Respiratory status: spontaneous breathing Cardiovascular status: blood pressure returned to baseline Anesthetic complications: no    Last Vitals:  Filed Vitals:   10/30/15 0845 10/30/15 0900  BP: 115/69 110/62  Pulse: 83 79  Temp:    Resp: 17 24    Last Pain:  Filed Vitals:   10/30/15 0903  PainSc: 4                  Jakaya Jacobowitz

## 2015-10-30 NOTE — Anesthesia Procedure Notes (Signed)
Procedure Name: Intubation Date/Time: 10/30/2015 7:45 AM Performed by: Franco Nones Pre-anesthesia Checklist: Patient identified, Patient being monitored, Timeout performed, Emergency Drugs available and Suction available Patient Re-evaluated:Patient Re-evaluated prior to inductionOxygen Delivery Method: Circle System Utilized Preoxygenation: Pre-oxygenation with 100% oxygen Intubation Type: IV induction, Rapid sequence and Cricoid Pressure applied Ventilation: Mask ventilation without difficulty Laryngoscope Size: Miller and 2 Grade View: Grade I Tube type: Oral Tube size: 7.0 mm Number of attempts: 1 Airway Equipment and Method: Stylet and Oral airway Placement Confirmation: ETT inserted through vocal cords under direct vision,  positive ETCO2 and breath sounds checked- equal and bilateral Secured at: 21 cm Tube secured with: Tape Dental Injury: Teeth and Oropharynx as per pre-operative assessment

## 2015-10-30 NOTE — Progress Notes (Signed)
[facility] PROGRESS NOTE  TAMI BLASS ZOX:[id number] DOB: [birth year]    PCP:   No primary care provider on file.   HPI:   25 year old female who  has a past medical history of Bipolar 1 disorder ([facility]); Anxiety; OCD (obsessive compulsive disorder); Depression; and Renal disorder.admitted for buttock cellulitis on Zosyn and IV [name], and was taken to the OR today for I and D.  She said she felt better.  The IV site is slightly red.   Rewiew of Systems:  Constitutional: Negative for malaise, fever and chills. No significant weight loss or weight gain Eyes: Negative for eye pain, redness and discharge, diplopia, visual changes, or flashes of light. ENMT: Negative for ear pain, hoarseness, nasal congestion, sinus pressure and sore throat. No headaches; tinnitus, drooling, or problem swallowing. Cardiovascular: Negative for chest pain, palpitations, diaphoresis, dyspnea and peripheral edema. ; No orthopnea, PND Respiratory: Negative for cough, hemoptysis, wheezing and stridor. No pleuritic chestpain. Gastrointestinal: Negative for nausea, vomiting, diarrhea, constipation, abdominal pain, melena, blood in stool, hematemesis, jaundice and rectal bleeding.    Genitourinary: Negative for frequency, dysuria, incontinence,flank pain and hematuria; Musculoskeletal: Negative for back pain and neck pain. Negative for swelling and trauma.;  Skin: . Negative for pruritus, rash, abrasions, bruising and skin lesion.; ulcerations Neuro: Negative for headache, lightheadedness and neck stiffness. Negative for weakness, altered level of consciousness , altered mental status, extremity weakness, burning feet, involuntary movement, seizure and syncope.  Psych: negative for anxiety, depression, insomnia, tearfulness, panic attacks, hallucinations, paranoia, suicidal or homicidal ideation    Past Medical History  Diagnosis Date  . Bipolar 1 disorder ([facility])   . Anxiety   . OCD (obsessive compulsive  disorder)   . Depression   . Renal disorder     kidney stones    History reviewed. No pertinent past surgical history.  Medications:  HOME MEDS: Prior to Admission medications   Medication Sig Start Date End Date Taking? Authorizing Provider  citalopram (CELEXA) 20 MG tablet Take 20 mg by mouth daily.   Yes Historical Provider, MD  hydrOXYzine (ATARAX/VISTARIL) 25 MG tablet Take 25 mg by mouth 2 (two) times daily as needed for anxiety.   Yes Historical Provider, MD     Allergies:  No Known Allergies  Social History:   reports that she has been smoking.  She does not have any smokeless tobacco history on file. She reports that she drinks alcohol. She reports that she does not use illicit drugs.  Family History: History reviewed. No pertinent family history.   Physical Exam: Filed Vitals:   10/30/15 0900 10/30/15 0910 10/30/15 0935 10/30/15 1106  BP: 110/62  106/67 99/59  Pulse: 79 83 83 81  Temp:   98.1 F (36.7 C) 99.1 F (37.3 C)  TempSrc:    Oral  Resp: Height:      Weight:      SpO2: 95% 97% 98% 100%   Blood pressure 99/59, pulse 81, temperature 99.1 F (37.3 C), temperature source Oral, resp. rate 19, height  (1.575 m), weight 77.111 kg (170 lb), last menstrual period 08/20/2015, SpO2 100 %.  GEN:  Pleasant  patient lying in the stretcher in no acute distress; cooperative with exam. PSYCH:  alert and oriented x4; does not appear anxious or depressed; affect is appropriate. HEENT: Mucous membranes pink and anicteric; PERRLA; EOM intact; no cervical lymphadenopathy nor thyromegaly or carotid bruit; no JVD; There were no stridor. Neck is very supple. Breasts:: Not  examined CHEST WALL: No tenderness CHEST: Normal respiration, clear to auscultation bilaterally.  HEART: Regular rate and rhythm.  There are no murmur, rub, or gallops.   BACK: No kyphosis or scoliosis; no CVA tenderness ABDOMEN: soft and non-tender; no masses, no organomegaly, normal  abdominal bowel sounds; no pannus; no intertriginous candida. There is no rebound and no distention. Rectal Exam: Not done EXTREMITIES: No bone or joint deformity; age-appropriate arthropathy of the hands and knees; no edema; no ulcerations.  There is no calf tenderness. Genitalia: not examined PULSES: 2+ and symmetric SKIN: Normal hydration no rash or ulceration. There is some redness at the IV site.  CNS: Cranial nerves 2-12 grossly intact no focal lateralizing neurologic deficit.  Speech is fluent; uvula elevated with phonation, facial symmetry and tongue midline. DTR are normal bilaterally, cerebella exam is intact, barbinski is negative and strengths are equaled bilaterally.  No sensory loss.   Labs on Admission:  Basic Metabolic Panel:  Recent Labs Lab 10/28/15 2230 10/29/15 0853 10/30/15 1155  NA 136 140 140  K 3.3* 3.8 3.7  CL 99* 109 107  CO2 GLUCOSE 108* 112* 106*  BUN 10 7 5*  CREATININE 0.63 0.43* 0.47  CALCIUM 8.8* 7.7* 8.4*   Liver Function Tests:  Recent Labs Lab 10/29/15 0853  AST 25  ALT 38  ALKPHOS 68  BILITOT 0.5  PROT 6.2*  ALBUMIN 2.8*   No results for input(s): LIPASE, AMYLASE in the last 168 hours. No results for input(s): AMMONIA in the last 168 hours. CBC:  Recent Labs Lab 10/28/15 2230 10/29/15 0853  WBC 22.2* 16.0*  NEUTROABS 17.6*  --   HGB 12.8 10.9*  HCT 38.1 31.8*  MCV 88.4 88.6  PLT 318 277    Radiological Exams on Admission: Ct Abdomen Pelvis W Contrast  10/29/2015  CLINICAL DATA:  Sudden onset of edema in the left buttock beginning 4 days ago. Fever with night sweats, vomiting, and chills. No bowel movement for 2 days. EXAM: CT ABDOMEN AND PELVIS WITH CONTRAST TECHNIQUE: Multidetector CT imaging of the abdomen and pelvis was performed using the standard protocol following bolus administration of intravenous contrast. CONTRAST:  OMNIPAQUE IOHEXOL 300 MG/ML  SOLN COMPARISON:  03/25/2010 FINDINGS: Lung bases are  clear. The liver, spleen, gallbladder, pancreas, adrenal glands, kidneys, abdominal aorta, inferior vena cava, and retroperitoneal lymph nodes are unremarkable. Stomach, small bowel, and colon are not abnormally distended. No free air or free fluid in the abdomen. Abdominal wall musculature appears intact. Pelvis: The appendix is normal. Uterus and ovaries are not enlarged. No free or loculated pelvic fluid collections. No pelvic mass or lymphadenopathy. Bladder wall is not thickened. There is a complex gas and fluid collection in the subcutaneous soft tissues extending from the left perianal region posteriorly to the left buttocks. This is consistent with abscess. The abscess measures about 5.8 x 6.5 cm. There is associated infiltration in the subcutaneous fat over the left buttocks consistent with cellulitis. No destructive bone lesions. IMPRESSION: 5.8 x 6.5 cm abscess in the subcutaneous fat over the left buttocks centrally with extension to the left perianal region. No acute process demonstrated internally within the abdomen or pelvis. Electronically Signed   By: Burman Nieves M.D.   On: 10/29/2015 02:40    EKG: Independently reviewed.    Assessment/Plan Present on Admission:  . Sepsis (HCC) . Left buttock abscess  PLAN:  Continue with current antibiotics.  Surgery is following her after her I and D.  Continue current Tx.  Change IV site.   Other plans as per orders. Code Status:FULL CODE.    Houston Siren, MD.  FACP [facility] Pager [phone] 7pm to 7am.  10/30/2015, 3:04 PM

## 2015-10-31 ENCOUNTER — Encounter (HOSPITAL_COMMUNITY): Payer: Self-pay | Admitting: General Surgery

## 2015-10-31 LAB — CBC
HCT: 29.4 % — ABNORMAL LOW (ref 36.0–46.0)
Hemoglobin: 10.1 g/dL — ABNORMAL LOW (ref 12.0–15.0)
MCH: 30 pg (ref 26.0–34.0)
MCHC: 34.4 g/dL (ref 30.0–36.0)
MCV: 87.2 fL (ref 78.0–100.0)
PLATELETS: 306 10*3/uL (ref 150–400)
RBC: 3.37 MIL/uL — ABNORMAL LOW (ref 3.87–5.11)
RDW: 13.4 % (ref 11.5–15.5)
WBC: 13.6 10*3/uL — ABNORMAL HIGH (ref 4.0–10.5)

## 2015-10-31 LAB — BASIC METABOLIC PANEL
Anion gap: 8 (ref 5–15)
BUN: 9 mg/dL (ref 6–20)
CHLORIDE: 106 mmol/L (ref 101–111)
CO2: 25 mmol/L (ref 22–32)
Calcium: 8.4 mg/dL — ABNORMAL LOW (ref 8.9–10.3)
Creatinine, Ser: 1.38 mg/dL — ABNORMAL HIGH (ref 0.44–1.00)
GFR calc Af Amer: >60 mL/min (ref >60)
GFR calc non Af Amer: 53 mL/min — ABNORMAL LOW (ref >60)
Glucose, Bld: 108 mg/dL — ABNORMAL HIGH (ref 65–99)
Potassium: 3.6 mmol/L (ref 3.5–5.1)
SODIUM: 139 mmol/L (ref 135–145)

## 2015-10-31 MED ORDER — VANCOMYCIN HCL 10 G IV SOLR
1250.0000 mg | Freq: Three times a day (TID) | INTRAVENOUS | Status: DC
  Administered 2015-10-31 – 2015-11-01 (×3): 1250 mg via INTRAVENOUS
  Filled 2015-10-31 (×13): qty 1250

## 2015-10-31 MED ORDER — FENTANYL CITRATE (PF) 100 MCG/2ML IJ SOLN
50.0000 ug | INTRAMUSCULAR | Status: DC | PRN
  Administered 2015-11-02: 50 ug via INTRAVENOUS
  Filled 2015-10-31: qty 2

## 2015-10-31 MED ORDER — DEXTROSE-NACL 5-0.9 % IV SOLN
INTRAVENOUS | Status: DC
  Administered 2015-10-31 – 2015-11-02 (×4): via INTRAVENOUS

## 2015-10-31 MED ORDER — LORAZEPAM 2 MG/ML IJ SOLN
1.0000 mg | INTRAMUSCULAR | Status: DC | PRN
  Administered 2015-10-31 – 2015-11-05 (×22): 1 mg via INTRAVENOUS
  Filled 2015-10-31 (×22): qty 1

## 2015-10-31 NOTE — Progress Notes (Signed)
[facility] PROGRESS NOTE  Rachel Sellers VFI:[id number] DOB: 1991/04/11    PCP:   No primary care provider on file.   HPI:  25 year old female who has a past medical history of Bipolar 1 disorder (HCC); Anxiety; OCD (obsessive compulsive disorder); Depression; and Renal disorder.admitted for buttock cellulitis on Zosyn and IV [name], and was taken to the OR today for I and D. She said she felt better. The IV site is slightly red and another IV site was started.  She has some nausea and vomiting.    Rewiew of Systems:  Constitutional: Negative for malaise, fever and chills. No significant weight loss or weight gain Eyes: Negative for eye pain, redness and discharge, diplopia, visual changes, or flashes of light. ENMT: Negative for ear pain, hoarseness, nasal congestion, sinus pressure and sore throat. No headaches; tinnitus, drooling, or problem swallowing. Cardiovascular: Negative for chest pain, palpitations, diaphoresis, dyspnea and peripheral edema. ; No orthopnea, PND Respiratory: Negative for cough, hemoptysis, wheezing and stridor. No pleuritic chestpain. Gastrointestinal: Negative for nausea, vomiting, diarrhea, constipation, abdominal pain, melena, blood in stool, hematemesis, jaundice and rectal bleeding.    Genitourinary: Negative for frequency, dysuria, incontinence,flank pain and hematuria; Musculoskeletal: Negative for back pain and neck pain. Negative for swelling and trauma.;  Skin: . Negative for pruritus, rash, abrasions, bruising and skin lesion.; ulcerations Neuro: Negative for headache, lightheadedness and neck stiffness. Negative for weakness, altered level of consciousness , altered mental status, extremity weakness, burning feet, involuntary movement, seizure and syncope.  Psych: negative for anxiety, depression, insomnia, tearfulness, panic attacks, hallucinations, paranoia, suicidal or homicidal ideation    Past Medical History  Diagnosis Date  . Bipolar 1  disorder (HCC)   . Anxiety   . OCD (obsessive compulsive disorder)   . Depression   . Renal disorder     kidney stones    Past Surgical History  Procedure Laterality Date  . Incision and drainage abscess Left 10/30/2015    Procedure: INCISION AND DRAINAGE PERI-RECTAL ABSCESS;  Surgeon: Franky Macho, MD;  Location: [facility];  Service: General;  Laterality: Left;    Medications:  HOME MEDS: Prior to Admission medications   Medication Sig Start Date End Date Taking? Authorizing Provider  citalopram (CELEXA) 20 MG tablet Take 20 mg by mouth daily.   Yes Historical Provider, MD  hydrOXYzine (ATARAX/VISTARIL) 25 MG tablet Take 25 mg by mouth 2 (two) times daily as needed for anxiety.   Yes Historical Provider, MD     Allergies:  No Known Allergies  Social History:   reports that she has been smoking.  She does not have any smokeless tobacco history on file. She reports that she drinks alcohol. She reports that she does not use illicit drugs.  Family History: History reviewed. No pertinent family history.   Physical Exam: Filed Vitals:   10/30/15 2032 10/30/15 2328 10/31/15 0710 10/31/15 1344  BP: 93/49  116/63 124/76  Pulse: 72  93 80  Temp: 98 F (36.7 C)  98.6 F (37 C) 98.3 F (36.8 C)  TempSrc: Oral  Oral Oral  Resp: Height:      Weight:      SpO2: 98% 98% 99% 98%   Blood pressure 124/76, pulse 80, temperature 98.3 F (36.8 C), temperature source Oral, resp. rate 18, height  (1.575 m), weight 77.111 kg (170 lb), last menstrual period 08/20/2015, SpO2 98 %.  GEN:  Pleasant  patient lying in the stretcher in no acute distress;  cooperative with exam. PSYCH:  alert and oriented x4; does not appear anxious or depressed; affect is appropriate. HEENT: Mucous membranes pink and anicteric; PERRLA; EOM intact; no cervical lymphadenopathy nor thyromegaly or carotid bruit; no JVD; There were no stridor. Neck is very supple. Breasts:: Not examined CHEST WALL: No  tenderness CHEST: Normal respiration, clear to auscultation bilaterally.  HEART: Regular rate and rhythm.  There are no murmur, rub, or gallops.   BACK: No kyphosis or scoliosis; no CVA tenderness ABDOMEN: soft and non-tender; no masses, no organomegaly, normal abdominal bowel sounds; no pannus; no intertriginous candida. There is no rebound and no distention. Rectal Exam: Not done EXTREMITIES: No bone or joint deformity; age-appropriate arthropathy of the hands and knees; no edema; no ulcerations.  There is no calf tenderness. Genitalia: not examined PULSES: 2+ and symmetric SKIN: Normal hydration no rash or ulceration CNS: Cranial nerves 2-12 grossly intact no focal lateralizing neurologic deficit.  Speech is fluent; uvula elevated with phonation, facial symmetry and tongue midline. DTR are normal bilaterally, cerebella exam is intact, barbinski is negative and strengths are equaled bilaterally.  No sensory loss.   Labs on Admission:  Basic Metabolic Panel:  Recent Labs Lab 10/28/15 2230 10/29/15 0853 10/30/15 1155 10/31/15 0728  NA 136 140 140 139  K 3.3* 3.8 3.7 3.6  CL 99* 109 107 106  CO2 GLUCOSE 108* 112* 106* 108*  BUN 10 7 5* 9  CREATININE 0.63 0.43* 0.47 1.38*  CALCIUM 8.8* 7.7* 8.4* 8.4*   Liver Function Tests:  Recent Labs Lab 10/29/15 0853  AST 25  ALT 38  ALKPHOS 68  BILITOT 0.5  PROT 6.2*  ALBUMIN 2.8*   CBC:  Recent Labs Lab 10/28/15 2230 10/29/15 0853 10/31/15 0728  WBC 22.2* 16.0* 13.6*  NEUTROABS 17.6*  --   --   HGB 12.8 10.9* 10.1*  HCT 38.1 31.8* 29.4*  MCV 88.4 88.6 87.2  PLT 318 277 306     EKG: Independently reviewed.   Assessment/Plan Present on Admission:  . Sepsis (HCC) . Left buttock abscess  PLAN:  Will continue with IV antibiotics.  Give IVF.  Follow Cr.  For her nausea, could be from pain meds vs post anesthesia N/V.   Will give IV Ativan with Zofran for antiemetic effect augmentation.  Will change pain  meds to IV Fentanyl.  Continue to observe.    Other plans as per orders. Code Status: FULL Unk Lightning, MD.  FACP [facility] Pager [phone] 7pm to 7am.  10/31/2015, 3:07 PM

## 2015-10-31 NOTE — Addendum Note (Signed)
Addendum  created 10/31/15 1132 by Moshe Salisbury, CRNA   Modules edited: Clinical Notes   Clinical Notes:  File: [id number]

## 2015-10-31 NOTE — Progress Notes (Signed)
1 Day Post-Op  Subjective: Patient with some nausea. Decreased buttock pain noted.  Objective: Vital signs in last 24 hours: Temp:  [97 F (36.1 C)-99.1 F (37.3 C)] 98.6 F (37 C) (02/16 0710) Pulse Rate:  [72-93] 93 (02/16 0710) Resp:  [19-27] 19 (02/16 0710) BP: (93-116)/(43-67) 116/63 mmHg (02/16 0710) SpO2:  [95 %-100 %] 99 % (02/16 0710) Last BM Date: 10/30/15  Intake/Output from previous day: 02/15 0701 - 02/16 0700 In: 2943 [P.O.:1080; I.V.:1563; IV Piggyback:300] Out: 25 [Blood:25] Intake/Output this shift:    General appearance: alert, cooperative, fatigued and no distress Pelvic: Packing removed.  Lab Results:   Recent Labs  10/29/15 0853 10/31/15 0728  WBC 16.0* 13.6*  HGB 10.9* 10.1*  HCT 31.8* 29.4*  PLT 277 306   BMET  Recent Labs  10/30/15 1155 10/31/15 0728  NA 140 139  K 3.7 3.6  CL 107 106  CO2 26 25  GLUCOSE 106* 108*  BUN 5* 9  CREATININE 0.47 1.38*  CALCIUM 8.4* 8.4*   PT/INR No results for input(s): LABPROT, INR in the last 72 hours.  Studies/Results: No results found.  Anti-infectives: Anti-infectives    Start     Dose/Rate Route Frequency Ordered Stop   10/30/15 0945  piperacillin-tazobactam (ZOSYN) IVPB 3.375 g     3.375 g 12.5 mL/hr over 240 Minutes Intravenous 3 times per day 10/30/15 0940     10/29/15 1000  vancomycin (VANCOCIN) IVPB 1000 mg/200 mL premix     1,000 mg 200 mL/hr over 60 Minutes Intravenous Every 8 hours 10/29/15 0852     10/29/15 0900  piperacillin-tazobactam (ZOSYN) IVPB 3.375 g  Status:  Discontinued     3.375 g 12.5 mL/hr over 240 Minutes Intravenous Every 8 hours 10/29/15 0827 10/29/15 1030   10/29/15 0100  vancomycin (VANCOCIN) IVPB 1000 mg/200 mL premix     1,000 mg 200 mL/hr over 60 Minutes Intravenous  Once 10/29/15 0046 10/29/15 0257   10/29/15 0100  piperacillin-tazobactam (ZOSYN) IVPB 3.375 g     3.375 g 100 mL/hr over 30 Minutes Intravenous  Once 10/29/15 0046 10/29/15 0153       Assessment/Plan: s/p Procedure(s): INCISION AND DRAINAGE PERI-RECTAL ABSCESS Impression: Stable on postoperative day 1. Will start sitz baths. Final cultures pending. Continue vancomycin and Zosyn.  LOS: 2 days    Ruthel Martine A 10/31/2015

## 2015-10-31 NOTE — Progress Notes (Signed)
Late entry:  Patient continues to complain of nausea/vomiting after receiving Zofran and Phenergan.  Dr. Conley Rolls notified.

## 2015-10-31 NOTE — Anesthesia Postprocedure Evaluation (Signed)
Anesthesia Post Note  Patient: Rachel Sellers  Procedure(s) Performed: Procedure(s) (LRB): INCISION AND DRAINAGE PERI-RECTAL ABSCESS (Left)  Patient location during evaluation: Nursing Unit Anesthesia Type: General Level of consciousness: awake and alert Pain management: pain level controlled Vital Signs Assessment: post-procedure vital signs reviewed and stable Respiratory status: spontaneous breathing Cardiovascular status: blood pressure returned to baseline : nausea and vomiting, but did not start until 12-18 hours after surgery. Anesthetic complications: no    Last Vitals:  Filed Vitals:   10/30/15 2032 10/31/15 0710  BP: 93/49 116/63  Pulse: 72 93  Temp: 36.7 C 37 C  Resp: 20 19    Last Pain:  Filed Vitals:   10/31/15 1125  PainSc: 2                  Asenath Balash

## 2015-10-31 NOTE — Progress Notes (Signed)
Pharmacy Antibiotic Note  Rachel Sellers is a 25 y.o. female admitted on 10/28/2015 with cellulitis / left buttock abscess.  Pharmacy has been consulted for Vancomycin and Zosyn dosing. Vanc trough last night was 8 mg/L.  Her SrCr is up today to 1.38  Plan: Change vanc to 1250 mg IV q8 hours Cont zosyn F/u cultures and renal function Will recheck VT @ steady state  Height:  (157.5 cm) Weight: 170 lb (77.111 kg) IBW/kg (Calculated) : 50.1  Temp (24hrs), Avg:98.2 F (36.8 C), Min:97 F (36.1 C), Max:99.1 F (37.3 C)   Recent Labs Lab 10/28/15 2230 10/29/15 0104 10/29/15 0853 10/30/15 1155 10/30/15 1907 10/31/15 0728  WBC 22.2*  --  16.0*  --   --  13.6*  CREATININE 0.63  --  0.43* 0.47  --  1.38*  LATICACIDVEN  --  1.35  --   --   --   --   VANCOTROUGH  --   --   --   --  8*  --     Estimated Creatinine Clearance: 59.9 mL/min (by C-G formula based on Cr of 1.38).    No Known Allergies  Antimicrobials this admission: Vancomycin 2/14 >>  Zosyn 2/14 >>   Dose adjustments this admission: 2/16  Change to 1250 q8,  VT 8  Microbiology results: 2/14 BCx: pending 2/13 UCx: pending  2/14 Wound: pending   Thank you for allowing pharmacy to be a part of this patient's care.  Talbert Cage Poteet 10/31/2015 10:48 AM

## 2015-11-01 LAB — COMPREHENSIVE METABOLIC PANEL
ALT: 29 U/L (ref 14–54)
ANION GAP: 10 (ref 5–15)
AST: 28 U/L (ref 15–41)
Albumin: 2.5 g/dL — ABNORMAL LOW (ref 3.5–5.0)
Alkaline Phosphatase: 68 U/L (ref 38–126)
BUN: 15 mg/dL (ref 6–20)
CHLORIDE: 106 mmol/L (ref 101–111)
CO2: 23 mmol/L (ref 22–32)
CREATININE: 2.77 mg/dL — AB (ref 0.44–1.00)
Calcium: 8 mg/dL — ABNORMAL LOW (ref 8.9–10.3)
GFR, EST AFRICAN AMERICAN: 26 mL/min — AB (ref >60)
GFR, EST NON AFRICAN AMERICAN: 23 mL/min — AB (ref >60)
Glucose, Bld: 112 mg/dL — ABNORMAL HIGH (ref 65–99)
POTASSIUM: 3.2 mmol/L — AB (ref 3.5–5.1)
SODIUM: 139 mmol/L (ref 135–145)
Total Bilirubin: 0.8 mg/dL (ref 0.3–1.2)
Total Protein: 5.8 g/dL — ABNORMAL LOW (ref 6.5–8.1)

## 2015-11-01 LAB — CBC
HCT: 31.5 % — ABNORMAL LOW (ref 36.0–46.0)
Hemoglobin: 10.4 g/dL — ABNORMAL LOW (ref 12.0–15.0)
MCH: 29.4 pg (ref 26.0–34.0)
MCHC: 33 g/dL (ref 30.0–36.0)
MCV: 89 fL (ref 78.0–100.0)
PLATELETS: 321 10*3/uL (ref 150–400)
RBC: 3.54 MIL/uL — AB (ref 3.87–5.11)
RDW: 13.2 % (ref 11.5–15.5)
WBC: 13.1 10*3/uL — ABNORMAL HIGH (ref 4.0–10.5)

## 2015-11-01 MED ORDER — METRONIDAZOLE 500 MG PO TABS
500.0000 mg | ORAL_TABLET | Freq: Three times a day (TID) | ORAL | Status: DC
  Administered 2015-11-01 – 2015-11-04 (×9): 500 mg via ORAL
  Filled 2015-11-01 (×9): qty 1

## 2015-11-01 MED ORDER — SODIUM CHLORIDE 0.9 % IV BOLUS (SEPSIS)
1000.0000 mL | Freq: Two times a day (BID) | INTRAVENOUS | Status: DC
  Administered 2015-11-01 – 2015-11-03 (×6): 1000 mL via INTRAVENOUS

## 2015-11-01 MED ORDER — CIPROFLOXACIN HCL 250 MG PO TABS
500.0000 mg | ORAL_TABLET | Freq: Two times a day (BID) | ORAL | Status: DC
  Administered 2015-11-01 – 2015-11-05 (×8): 500 mg via ORAL
  Filled 2015-11-01 (×9): qty 2

## 2015-11-01 NOTE — Discharge Instructions (Signed)
How to Take a Sitz Bath A sitz bath is a warm water bath that is taken while you are sitting down. The water should only come up to your hips and should cover your buttocks. Your health care provider may recommend a sitz bath to help you:   Clean the lower part of your body, including your genital area.  With itching.  With pain.  With sore muscles or muscles that tighten or spasm. HOW TO TAKE A SITZ BATH Take 1-2Perirectal Abscess An abscess is an infected area that contains a collection of pus. A perirectal abscess is an abscess that is near the opening of the anus or around the rectum. A perirectal abscess can cause a lot of pain, especially during bowel movements. CAUSES This condition is almost always caused by an infection that starts in an anal gland. RISK FACTORS This condition is more likely to develop in:  People with diabetes or inflammatory bowel disease.  People whose body defense system (immune system) is weak.  People who have anal sex.  People who have a sexually transmitted disease (STD).  People who have certain kinds of cancers, such as rectal carcinoma, leukemia, or lymphoma. SYMPTOMS The main symptom of this condition is pain. The pain may be a throbbing pain that gets worse during bowel movements. Other symptoms include:  Fever.  Swelling.  Redness.  Bleeding.  Constipation. DIAGNOSIS The condition is diagnosed with a physical exam. If the abscess is not visible, a health care provider may need to place a finger inside the rectum to find the abscess. Sometimes, imaging tests are done to determine the size and location of the abscess. These tests may include:  An ultrasound.  An MRI.  A CT scan. TREATMENT This condition is usually treated with incision and drainage surgery. Incision and drainage surgery involves making an incision over the abscess to drain the pus. Treatment may also involve antibiotic medicine, pain medicine, stool softeners, or  laxatives. HOME CARE INSTRUCTIONS  Take medicines only as directed by your health care provider.  If you were prescribed an antibiotic, finish all of it even if you start to feel better.  To relieve pain, try sitting:  In a warm, shallow bath (sitz bath).  On a heating pad with the setting on low.  On an inflatable donut-shaped cushion.  Follow any diet instructions as directed by your health care provider.  Keep all follow-up visits as directed by your health care provider. This is important. SEEK MEDICAL CARE IF:  Your abscess is bleeding.  You have pain, swelling, or redness that is getting worse.  You are constipated.  You feel ill.  You have muscle aches or chills.  You have a fever.  Your symptoms return after the abscess has healed.   This information is not intended to replace advice given to you by your health care provider. Make sure you discuss any questions you have with your health care provider.   Document Released: 08/28/2000 Document Revised: 05/22/2015 Document Reviewed: 07/11/2014 Elsevier Interactive Patient Education 2016 [employer].  sitz baths per day or as told by your health care provider.  Partially fill a bathtub with warm water. You will only need the water to be deep enough to cover your hips and buttocks when you are sitting in it.  If your health care provider told you to put medicine in the water, follow the directions exactly.  Sit in the water and open the tub drain a little.  Turn on  the warm water again to keep the tub at the correct level. Keep the water running constantly.  Soak in the water for 15-20 minutes or as told by your health care provider.  After the sitz bath, pat the affected area dry first. Do not rub it.  Be careful when you stand up after the sitz bath because you may feel dizzy. SEEK MEDICAL CARE IF:  Your symptoms get worse. Do not continue with sitz baths if your symptoms get worse.  You have new  symptoms. Do not continue with sitz baths until you talk with your health care provider.   This information is not intended to replace advice given to you by your health care provider. Make sure you discuss any questions you have with your health care provider.   Document Released: 05/23/2004 Document Revised: 01/15/2015 Document Reviewed: 08/29/2014 Elsevier Interactive Patient Education [employer]

## 2015-11-01 NOTE — Progress Notes (Signed)
Nutrition Brief Note  Patient identified on the Malnutrition Screening Tool (MST) Report  Wt Readings from Last 15 Encounters:  10/28/15 170 lb (77.111 kg)  10/09/11 160 lb (72.576 kg)    Body mass index is 31.09 kg/(m^2). Patient meets criteria for Obese based on current BMI.   Pt came in with abscess s/p  I/D. Was due to go home today, but had elevated Creatinine this morning and D/C was cancelled.   Upon RD arrival, pt was asleep. Did not feel visit urgent enough to disturb. Per chart review, there is not enough info to determine if pt has lost weight. However, currently she is eating 50-10% of her meals.   Anticipate D/C in very near future. No nutrition interventions warranted at this time. If nutrition issues arise, please consult RD.   Christophe Louis RD, LDN Nutrition Pager: [phone] 11/01/2015 3:28 PM

## 2015-11-01 NOTE — Progress Notes (Signed)
[facility] PROGRESS NOTE  SHALAY CARDER ZOX:[id number] DOB: 19-Dec-1990    PCP:   No primary care provider on file.   HPI:  25 year old female who has a past medical history of Bipolar 1 disorder (HCC); Anxiety; OCD (obsessive compulsive disorder); Depression; and Renal disorder.admitted for buttock cellulitis on Zosyn and IV [name], and was taken to the OR today for I and D. She said she felt better. The IV site is slightly red and another IV site was started. She has some nausea and vomiting, but has improved with changing of narcotics to Fentanyl.  She was ready for discharge, however, her Cr rose to 2.77, so discharge was cancelled.   No other complaints.    Rewiew of Systems:  Constitutional: Negative for malaise, fever and chills. No significant weight loss or weight gain Eyes: Negative for eye pain, redness and discharge, diplopia, visual changes, or flashes of light. ENMT: Negative for ear pain, hoarseness, nasal congestion, sinus pressure and sore throat. No headaches; tinnitus, drooling, or problem swallowing. Cardiovascular: Negative for chest pain, palpitations, diaphoresis, dyspnea and peripheral edema. ; No orthopnea, PND Respiratory: Negative for cough, hemoptysis, wheezing and stridor. No pleuritic chestpain. Gastrointestinal: Negative for nausea, vomiting, diarrhea, constipation, abdominal pain, melena, blood in stool, hematemesis, jaundice and rectal bleeding.    Genitourinary: Negative for frequency, dysuria, incontinence,flank pain and hematuria; Musculoskeletal: Negative for back pain and neck pain. Negative for swelling and trauma.;  Skin: . Negative for pruritus, rash, abrasions, bruising and skin lesion.; ulcerations Neuro: Negative for headache, lightheadedness and neck stiffness. Negative for weakness, altered level of consciousness , altered mental status, extremity weakness, burning feet, involuntary movement, seizure and syncope.  Psych: negative for  anxiety, depression, insomnia, tearfulness, panic attacks, hallucinations, paranoia, suicidal or homicidal ideation   Past Medical History  Diagnosis Date  . Bipolar 1 disorder (HCC)   . Anxiety   . OCD (obsessive compulsive disorder)   . Depression   . Renal disorder     kidney stones    Past Surgical History  Procedure Laterality Date  . Incision and drainage abscess Left 10/30/2015    Procedure: INCISION AND DRAINAGE PERI-RECTAL ABSCESS;  Surgeon: Franky Macho, MD;  Location: [facility];  Service: General;  Laterality: Left;    Medications:  HOME MEDS: Prior to Admission medications   Medication Sig Start Date End Date Taking? Authorizing Provider  citalopram (CELEXA) 20 MG tablet Take 20 mg by mouth daily.   Yes Historical Provider, MD  hydrOXYzine (ATARAX/VISTARIL) 25 MG tablet Take 25 mg by mouth 2 (two) times daily as needed for anxiety.   Yes Historical Provider, MD     Allergies:  No Known Allergies  Social History:   reports that she has been smoking.  She does not have any smokeless tobacco history on file. She reports that she drinks alcohol. She reports that she does not use illicit drugs.  Family History: History reviewed. No pertinent family history.   Physical Exam: Filed Vitals:   10/31/15 0710 10/31/15 1344 10/31/15 2259 11/01/15 0653  BP: 116/63 124/76 108/61 112/59  Pulse: 93 80 77 79  Temp: 98.6 F (37 C) 98.3 F (36.8 C) 97.6 F (36.4 C) 98.2 F (36.8 C)  TempSrc: Oral Oral Oral Oral  Resp: Height:      Weight:      SpO2: 99% 98% 97% 99%   Blood pressure 112/59, pulse 79, temperature 98.2 F (36.8 C), temperature source Oral, resp. rate 16,  height  (1.575 m), weight 77.111 kg (170 lb), last menstrual period 08/20/2015, SpO2 99 %.  GEN:  Pleasant  patient lying in the stretcher in no acute distress; cooperative with exam. PSYCH:  alert and oriented x4; does not appear anxious or depressed; affect is appropriate. HEENT:  Mucous membranes pink and anicteric; PERRLA; EOM intact; no cervical lymphadenopathy nor thyromegaly or carotid bruit; no JVD; There were no stridor. Neck is very supple. Breasts:: Not examined CHEST WALL: No tenderness CHEST: Normal respiration, clear to auscultation bilaterally.  HEART: Regular rate and rhythm.  There are no murmur, rub, or gallops.   BACK: No kyphosis or scoliosis; no CVA tenderness ABDOMEN: soft and non-tender; no masses, no organomegaly, normal abdominal bowel sounds; no pannus; no intertriginous candida. There is no rebound and no distention. Rectal Exam: Not done EXTREMITIES: No bone or joint deformity; age-appropriate arthropathy of the hands and knees; no edema; no ulcerations.  There is no calf tenderness. Genitalia: not examined PULSES: 2+ and symmetric SKIN: Normal hydration no rash or ulceration CNS: Cranial nerves 2-12 grossly intact no focal lateralizing neurologic deficit.  Speech is fluent; uvula elevated with phonation, facial symmetry and tongue midline. DTR are normal bilaterally, cerebella exam is intact, barbinski is negative and strengths are equaled bilaterally.  No sensory loss.   Labs on Admission:  Basic Metabolic Panel:  Recent Labs Lab 10/28/15 2230 10/29/15 0853 10/30/15 1155 10/31/15 0728 11/01/15 0708  NA 136 140 140 139 139  K 3.3* 3.8 3.7 3.6 3.2*  CL 99* 109 107 106 106  CO2 GLUCOSE 108* 112* 106* 108* 112*  BUN 10 7 5* 9 15  CREATININE 0.63 0.43* 0.47 1.38* 2.77*  CALCIUM 8.8* 7.7* 8.4* 8.4* 8.0*   Liver Function Tests:  Recent Labs Lab 10/29/15 0853 11/01/15 0708  AST 25 28  ALT 38 29  ALKPHOS 68 68  BILITOT 0.5 0.8  PROT 6.2* 5.8*  ALBUMIN 2.8* 2.5*   CBC:  Recent Labs Lab 10/28/15 2230 10/29/15 0853 10/31/15 0728 11/01/15 0708  WBC 22.2* 16.0* 13.6* 13.1*  NEUTROABS 17.6*  --   --   --   HGB 12.8 10.9* 10.1* 10.4*  HCT 38.1 31.8* 29.4* 31.5*  MCV 88.4 88.6 87.2 89.0  PLT 318 277 306 321     Assessment/Plan Present on Admission:  . Sepsis (HCC) . Left buttock abscess  PLAN:  Buttock abscess:  S/p I and D.  Will change antibiotic to Cipro and Flagyl orally per surgery's recommendation.   AKI:  Suspect Vancomycin and mild dehydration.  Hold discharge.  Give IVF boluses, d/c vanc and Zosyn.  Repeat Bmet tomorrow.  Bipolar/Depression:  Continue home meds.   Other plans as per orders. Code Status:FULL CODE.    Houston Siren, MD.  FACP [facility] Pager [phone] 7pm to 7am.  11/01/2015, 10:09 AM

## 2015-11-01 NOTE — Progress Notes (Signed)
2 Days Post-Op  Subjective: Patient's left buttock wound significantly improved. Still with some serosanguineous drainage.  Objective: Vital signs in last 24 hours: Temp:  [97.6 F (36.4 C)-98.3 F (36.8 C)] 98.2 F (36.8 C) (02/17 0653) Pulse Rate:  [77-80] 79 (02/17 0653) Resp:  [16-20] 16 (02/17 0653) BP: (108-124)/(59-76) 112/59 mmHg (02/17 0653) SpO2:  [97 %-99 %] 99 % (02/17 0653) Last BM Date: 10/30/15  Intake/Output from previous day: 02/16 0701 - 02/17 0700 In: 600 [P.O.:600] Out: 200 [Urine:200] Intake/Output this shift:    General appearance: alert, cooperative and no distress Pelvic: Left buttock with decreased induration and erythema. No significant fluctuance noted.  Lab Results:   Recent Labs  10/31/15 0728 11/01/15 0708  WBC 13.6* 13.1*  HGB 10.1* 10.4*  HCT 29.4* 31.5*  PLT 306 321   BMET  Recent Labs  10/31/15 0728 11/01/15 0708  NA 139 139  K 3.6 3.2*  CL 106 106  CO2 25 23  GLUCOSE 108* 112*  BUN 9 15  CREATININE 1.38* 2.77*  CALCIUM 8.4* 8.0*   PT/INR No results for input(s): LABPROT, INR in the last 72 hours.  Studies/Results: No results found.  Anti-infectives: Anti-infectives    Start     Dose/Rate Route Frequency Ordered Stop   10/31/15 1200  vancomycin (VANCOCIN) 1,250 mg in sodium chloride 0.9 % 250 mL IVPB     1,250 mg 166.7 mL/hr over 90 Minutes Intravenous Every 8 hours 10/31/15 0948     10/30/15 0945  piperacillin-tazobactam (ZOSYN) IVPB 3.375 g     3.375 g 12.5 mL/hr over 240 Minutes Intravenous 3 times per day 10/30/15 0940     10/29/15 1000  vancomycin (VANCOCIN) IVPB 1000 mg/200 mL premix  Status:  Discontinued     1,000 mg 200 mL/hr over 60 Minutes Intravenous Every 8 hours 10/29/15 0852 10/31/15 0948   10/29/15 0900  piperacillin-tazobactam (ZOSYN) IVPB 3.375 g  Status:  Discontinued     3.375 g 12.5 mL/hr over 240 Minutes Intravenous Every 8 hours 10/29/15 0827 10/29/15 1030   10/29/15 0100  vancomycin  (VANCOCIN) IVPB 1000 mg/200 mL premix     1,000 mg 200 mL/hr over 60 Minutes Intravenous  Once 10/29/15 0046 10/29/15 0257   10/29/15 0100  piperacillin-tazobactam (ZOSYN) IVPB 3.375 g     3.375 g 100 mL/hr over 30 Minutes Intravenous  Once 10/29/15 0046 10/29/15 0153      Assessment/Plan: s/p Procedure(s): INCISION AND DRAINAGE PERI-RECTAL ABSCESS Impression: Continues to improve, status post IND of perirectal abscess. Final ID pending. Should patient be discharged, would continue ciprofloxacin and Flagyl. Patient is receiving fluid bolus for elevated creatinine most likely secondary to vancomycin. Would continue sitz baths as an outpatient. We'll see her in my office next week. [facility] will be covering this weekend should any problems arise.  LOS: 3 days    Kennley Schwandt A 11/01/2015

## 2015-11-02 ENCOUNTER — Inpatient Hospital Stay (HOSPITAL_COMMUNITY): Payer: Self-pay

## 2015-11-02 LAB — BASIC METABOLIC PANEL
Anion gap: 10 (ref 5–15)
BUN: 14 mg/dL (ref 6–20)
CALCIUM: 7.7 mg/dL — AB (ref 8.9–10.3)
CO2: 20 mmol/L — ABNORMAL LOW (ref 22–32)
CREATININE: 3.09 mg/dL — AB (ref 0.44–1.00)
Chloride: 111 mmol/L (ref 101–111)
GFR calc Af Amer: 23 mL/min — ABNORMAL LOW (ref >60)
GFR, EST NON AFRICAN AMERICAN: 20 mL/min — AB (ref >60)
GLUCOSE: 134 mg/dL — AB (ref 65–99)
Potassium: 3.1 mmol/L — ABNORMAL LOW (ref 3.5–5.1)
Sodium: 141 mmol/L (ref 135–145)

## 2015-11-02 LAB — CULTURE, ROUTINE-ABSCESS

## 2015-11-02 MED ORDER — FUROSEMIDE 10 MG/ML IJ SOLN
20.0000 mg | Freq: Two times a day (BID) | INTRAMUSCULAR | Status: DC
  Administered 2015-11-02 – 2015-11-03 (×2): 20 mg via INTRAVENOUS
  Filled 2015-11-02 (×2): qty 2

## 2015-11-02 MED ORDER — KCL IN DEXTROSE-NACL 20-5-0.9 MEQ/L-%-% IV SOLN
INTRAVENOUS | Status: DC
  Administered 2015-11-02 – 2015-11-03 (×3): via INTRAVENOUS

## 2015-11-02 MED ORDER — ONDANSETRON HCL 4 MG/2ML IJ SOLN
4.0000 mg | INTRAMUSCULAR | Status: DC | PRN
  Administered 2015-11-02 – 2015-11-05 (×14): 4 mg via INTRAVENOUS
  Filled 2015-11-02 (×15): qty 2

## 2015-11-02 MED ORDER — ENOXAPARIN SODIUM 30 MG/0.3ML ~~LOC~~ SOLN
30.0000 mg | SUBCUTANEOUS | Status: DC
  Administered 2015-11-03 – 2015-11-05 (×3): 30 mg via SUBCUTANEOUS
  Filled 2015-11-02 (×3): qty 0.3

## 2015-11-02 MED ORDER — PROMETHAZINE HCL 25 MG/ML IJ SOLN
12.5000 mg | Freq: Four times a day (QID) | INTRAMUSCULAR | Status: DC | PRN
  Administered 2015-11-02: 25 mg via INTRAVENOUS
  Filled 2015-11-02: qty 1

## 2015-11-02 NOTE — Progress Notes (Signed)
Pt is nauseous and actively vomiting. Ativan and Zofran are ordered Q4 PRN, but were last given and 1730. Spoke to Dr.Geunther who ordered 12.5 mg of phenergen and changed her diet from regular to clears. Will continue to monitor.

## 2015-11-02 NOTE — Progress Notes (Signed)
[facility] PROGRESS NOTE  Rachel Sellers:[id number] DOB: [birth year]    PCP:   No primary care provider on file.   HPI: 25 year old female who has a past medical history of Bipolar 1 disorder (HCC); Anxiety; OCD (obsessive compulsive disorder); Depression; and Renal disorder.admitted for buttock cellulitis on Zosyn and IV [name], and was taken to the OR today for I and D. She said she felt better. The IV site is slightly red and another IV site was started. She has some nausea and vomiting, but has improved with changing of narcotics to Fentanyl. She was ready for discharge, however, her Cr rose to 2.77, so discharge was cancelled.She was given IVF aggressively, Van/Zosyn IV was discontinued, changed to oral Cipro and Flagyl, but her Cr continue to rise to 3.09 today. She has nausea and vomiting.    Rewiew of Systems:  Constitutional: Negative for malaise, fever and chills. No significant weight loss or weight gain Eyes: Negative for eye pain, redness and discharge, diplopia, visual changes, or flashes of light. ENMT: Negative for ear pain, hoarseness, nasal congestion, sinus pressure and sore throat. No headaches; tinnitus, drooling, or problem swallowing. Cardiovascular: Negative for chest pain, palpitations, diaphoresis, dyspnea and peripheral edema. ; No orthopnea, PND Respiratory: Negative for cough, hemoptysis, wheezing and stridor. No pleuritic chestpain. Gastrointestinal: Negative for diarrhea, constipation, abdominal pain, melena, blood in stool, hematemesis, jaundice and rectal bleeding.    Genitourinary: Negative for frequency, dysuria, incontinence,flank pain and hematuria; Musculoskeletal: Negative for back pain and neck pain. Negative for swelling and trauma.;  Skin: . Negative for pruritus, rash, abrasions, bruising and skin lesion.; ulcerations Neuro: Negative for headache, lightheadedness and neck stiffness. Negative for weakness, altered level of consciousness ,  altered mental status, extremity weakness, burning feet, involuntary movement, seizure and syncope.  Psych: negative for anxiety, depression, insomnia, tearfulness, panic attacks, hallucinations, paranoia, suicidal or homicidal ideation   Past Medical History  Diagnosis Date  . Bipolar 1 disorder (HCC)   . Anxiety   . OCD (obsessive compulsive disorder)   . Depression   . Renal disorder     kidney stones    Past Surgical History  Procedure Laterality Date  . Incision and drainage abscess Left 10/30/2015    Procedure: INCISION AND DRAINAGE PERI-RECTAL ABSCESS;  Surgeon: Franky Macho, MD;  Location: [facility];  Service: General;  Laterality: Left;    Medications:  HOME MEDS: Prior to Admission medications   Medication Sig Start Date End Date Taking? Authorizing Provider  citalopram (CELEXA) 20 MG tablet Take 20 mg by mouth daily.   Yes Historical Provider, MD  hydrOXYzine (ATARAX/VISTARIL) 25 MG tablet Take 25 mg by mouth 2 (two) times daily as needed for anxiety.   Yes Historical Provider, MD     Allergies:  No Known Allergies  Social History:   reports that she has been smoking.  She does not have any smokeless tobacco history on file. She reports that she drinks alcohol. She reports that she does not use illicit drugs.  Family History: History reviewed. No pertinent family history.   Physical Exam: Filed Vitals:   11/01/15 1424 11/01/15 2017 11/01/15 2100 11/02/15 0544  BP: 119/71  117/65 132/83  Pulse: 71  73 69  Temp: 98.8 F (37.1 C)  98.6 F (37 C) 98 F (36.7 C)  TempSrc: Oral  Oral Oral  Resp: Height:      Weight:      SpO2: 98% 94% 97% 97%  Blood pressure 132/83, pulse 69, temperature 98 F (36.7 C), temperature source Oral, resp. rate 15, height  (1.575 m), weight 77.111 kg (170 lb), last menstrual period 08/20/2015, SpO2 97 %.  GEN:  Pleasant patient lying in the stretcher in no acute distress; cooperative with exam. PSYCH:  alert and  oriented x4; does not appear anxious or depressed; affect is appropriate. HEENT: Mucous membranes pink and anicteric; PERRLA; EOM intact; no cervical lymphadenopathy nor thyromegaly or carotid bruit; no JVD; There were no stridor. Neck is very supple. Breasts:: Not examined CHEST WALL: No tenderness CHEST: Normal respiration, clear to auscultation bilaterally.  HEART: Regular rate and rhythm.  There are no murmur, rub, or gallops.   BACK: No kyphosis or scoliosis; no CVA tenderness ABDOMEN: soft and non-tender; no masses, no organomegaly, normal abdominal bowel sounds; no pannus; no intertriginous candida. There is no rebound and no distention. Rectal Exam: Not done EXTREMITIES: No bone or joint deformity; age-appropriate arthropathy of the hands and knees; no edema; no ulcerations.  There is no calf tenderness. Genitalia: not examined PULSES: 2+ and symmetric SKIN: Normal hydration no rash or ulceration CNS: Cranial nerves 2-12 grossly intact no focal lateralizing neurologic deficit.  Speech is fluent; uvula elevated with phonation, facial symmetry and tongue midline. DTR are normal bilaterally, cerebella exam is intact, barbinski is negative and strengths are equaled bilaterally.  No sensory loss.   Labs on Admission:  Basic Metabolic Panel:  Recent Labs Lab 10/29/15 0853 10/30/15 1155 10/31/15 0728 11/01/15 0708 11/02/15 0646  NA 140 140 139 139 141  K 3.8 3.7 3.6 3.2* 3.1*  CL 109 107 106 106 111  CO2 20*  GLUCOSE 112* 106* 108* 112* 134*  BUN 7 5* CREATININE 0.43* 0.47 1.38* 2.77* 3.09*  CALCIUM 7.7* 8.4* 8.4* 8.0* 7.7*   Liver Function Tests:  Recent Labs Lab 10/29/15 0853 11/01/15 0708  AST 25 28  ALT 38 29  ALKPHOS 68 68  BILITOT 0.5 0.8  PROT 6.2* 5.8*  ALBUMIN 2.8* 2.5*   CBC:  Recent Labs Lab 10/28/15 2230 10/29/15 0853 10/31/15 0728 11/01/15 0708  WBC 22.2* 16.0* 13.6* 13.1*  NEUTROABS 17.6*  --   --   --   HGB 12.8 10.9* 10.1*  10.4*  HCT 38.1 31.8* 29.4* 31.5*  MCV 88.4 88.6 87.2 89.0  PLT 318 277 306 321    Assessment/Plan:  Buttock abscess: S/p I and D. Will change antibiotic to Cipro and Flagyl orally per surgery's recommendation.   AKI: Suspect Vancomycin, with sepsis, volume depletion from N/V, and contrast nephropathy.  Will obtain renal US, continue aggressive rehydration. Consult nephrology. Hold discharge. Repeat Bmet tomorrow.  Bipolar/Depression: Continue home meds.   Other plans as per orders. Code Status:FULL CODE.    Houston Siren, MD.  FACP [facility] Pager [phone] 7pm to 7am.  11/02/2015, 9:25 AM

## 2015-11-02 NOTE — Progress Notes (Signed)
Pt is still dry heaving with some occassional vomiting so unable to give night time medication at this time. Will continue to monitor.

## 2015-11-03 DIAGNOSIS — N179 Acute kidney failure, unspecified: Secondary | ICD-10-CM

## 2015-11-03 LAB — CULTURE, BLOOD (ROUTINE X 2)
CULTURE: NO GROWTH
Culture: NO GROWTH

## 2015-11-03 LAB — BASIC METABOLIC PANEL
ANION GAP: 7 (ref 5–15)
BUN: 13 mg/dL (ref 6–20)
CALCIUM: 7.8 mg/dL — AB (ref 8.9–10.3)
CO2: 19 mmol/L — ABNORMAL LOW (ref 22–32)
Chloride: 116 mmol/L — ABNORMAL HIGH (ref 101–111)
Creatinine, Ser: 3.13 mg/dL — ABNORMAL HIGH (ref 0.44–1.00)
GFR calc Af Amer: 23 mL/min — ABNORMAL LOW (ref >60)
GFR, EST NON AFRICAN AMERICAN: 20 mL/min — AB (ref >60)
Glucose, Bld: 211 mg/dL — ABNORMAL HIGH (ref 65–99)
POTASSIUM: 3.3 mmol/L — AB (ref 3.5–5.1)
SODIUM: 142 mmol/L (ref 135–145)

## 2015-11-03 MED ORDER — FUROSEMIDE 10 MG/ML IJ SOLN
40.0000 mg | Freq: Two times a day (BID) | INTRAMUSCULAR | Status: DC
  Administered 2015-11-03 – 2015-11-04 (×3): 40 mg via INTRAVENOUS
  Filled 2015-11-03 (×4): qty 4

## 2015-11-03 MED ORDER — PROMETHAZINE HCL 25 MG/ML IJ SOLN
12.5000 mg | Freq: Once | INTRAMUSCULAR | Status: AC
  Administered 2015-11-03: 12.5 mg via INTRAVENOUS
  Filled 2015-11-03: qty 1

## 2015-11-03 MED ORDER — NYSTATIN 100000 UNIT/ML MT SUSP
5.0000 mL | Freq: Four times a day (QID) | OROMUCOSAL | Status: DC
  Administered 2015-11-03 – 2015-11-05 (×5): 500000 [IU] via ORAL
  Filled 2015-11-03 (×7): qty 5

## 2015-11-03 MED ORDER — POTASSIUM CHLORIDE 2 MEQ/ML IV SOLN
INTRAVENOUS | Status: DC
  Administered 2015-11-03 – 2015-11-05 (×5): via INTRAVENOUS
  Filled 2015-11-03 (×11): qty 1000

## 2015-11-03 MED ORDER — PROMETHAZINE HCL 25 MG/ML IJ SOLN
25.0000 mg | INTRAMUSCULAR | Status: DC | PRN
  Administered 2015-11-03 – 2015-11-05 (×3): 25 mg via INTRAVENOUS
  Filled 2015-11-03 (×6): qty 1

## 2015-11-03 MED ORDER — LACTINEX PO CHEW
1.0000 | CHEWABLE_TABLET | Freq: Three times a day (TID) | ORAL | Status: DC
  Administered 2015-11-03 – 2015-11-05 (×3): 1 via ORAL
  Filled 2015-11-03 (×9): qty 1

## 2015-11-03 NOTE — Consult Note (Signed)
Reason for Consult: Acute kidney injury Referring Physician: Dr. Elijah Birk is an 25 y.o. female.  HPI: She is a patient who has history of bipolar disorder, depression, history of kidney stone presently came with complaints of some nausea and swelling of her buttocks. When she was evaluated she was found to have left buttock abscess. Consult is called because of acute kidney injury. Presently patient denies any previous history of renal failure. She denies any fevers chills or sweating. Her main problem at this moment seems to be recurrent nausea and vomiting.  Past Medical History  Diagnosis Date  . Bipolar 1 disorder ([place])   . Anxiety   . OCD (obsessive compulsive disorder)   . Depression   . Renal disorder     kidney stones    Past Surgical History  Procedure Laterality Date  . Incision and drainage abscess Left 10/30/2015    Procedure: INCISION AND DRAINAGE PERI-RECTAL ABSCESS;  Surgeon: Aviva Signs, MD;  Location: [facility];  Service: General;  Laterality: Left;    History reviewed. No pertinent family history.  Social History:  reports that she has been smoking.  She does not have any smokeless tobacco history on file. She reports that she drinks alcohol. She reports that she does not use illicit drugs.  Allergies: No Known Allergies  Medications: I have reviewed the patient'Sellers current medications.  Results for orders placed or performed during the hospital encounter of 10/28/15 (from the past 48 hour(Sellers))  Basic metabolic panel     Status: Abnormal   Collection Time: 11/02/15  6:46 AM  Result Value Ref Range   Sodium 141 135 - 145 mmol/L   Potassium 3.1 (L) 3.5 - 5.1 mmol/L   Chloride 111 101 - 111 mmol/L   CO2 20 (L) 22 - 32 mmol/L   Glucose, Bld 134 (H) 65 - 99 mg/dL   BUN 14 6 - 20 mg/dL   Creatinine, Ser 3.09 (H) 0.44 - 1.00 mg/dL   Calcium 7.7 (L) 8.9 - 10.3 mg/dL   GFR calc non Af Amer 20 (L) >60 mL/min   GFR calc Af Amer 23 (L) >60 mL/min    Comment:  (NOTE) The eGFR has been calculated using the CKD EPI equation. This calculation has not been validated in all clinical situations. eGFR'Sellers persistently <60 mL/min signify possible Chronic Kidney Disease.    Anion gap 10 5 - 15  Basic metabolic panel     Status: Abnormal   Collection Time: 11/03/15  7:45 AM  Result Value Ref Range   Sodium 142 135 - 145 mmol/L   Potassium 3.3 (L) 3.5 - 5.1 mmol/L   Chloride 116 (H) 101 - 111 mmol/L   CO2 19 (L) 22 - 32 mmol/L   Glucose, Bld 211 (H) 65 - 99 mg/dL   BUN 13 6 - 20 mg/dL   Creatinine, Ser 3.13 (H) 0.44 - 1.00 mg/dL   Calcium 7.8 (L) 8.9 - 10.3 mg/dL   GFR calc non Af Amer 20 (L) >60 mL/min   GFR calc Af Amer 23 (L) >60 mL/min    Comment: (NOTE) The eGFR has been calculated using the CKD EPI equation. This calculation has not been validated in all clinical situations. eGFR'Sellers persistently <60 mL/min signify possible Chronic Kidney Disease.    Anion gap 7 5 - 15    US Renal  11/02/2015  CLINICAL DATA:  Acute kidney injury EXAM: RENAL / URINARY TRACT ULTRASOUND COMPLETE COMPARISON:  CT abdomen 10/29/2015 FINDINGS:  Right Kidney: Length: 13.0 cm. Echogenicity within normal limits. No mass or hydronephrosis visualized. Left Kidney: Length: 13.7 cm. Echogenicity within normal limits. No mass or hydronephrosis visualized. Bladder: Appears normal for degree of bladder distention. Small amount of free fluid in the pelvis. IMPRESSION: Normal renal ultrasound.  Small amount of free fluid in the pelvis. Electronically Signed   By: Franchot Gallo M.D.   On: 11/02/2015 10:50    Review of Systems  Constitutional: Positive for chills. Negative for fever.  Respiratory: Positive for cough. Negative for sputum production and shortness of breath.   Cardiovascular: Negative for orthopnea and leg swelling.  Gastrointestinal: Positive for nausea and vomiting. Negative for abdominal pain.  Neurological: Positive for weakness.   Blood pressure 135/84, pulse  69, temperature 97.6 F (36.4 C), temperature source Oral, resp. rate 17, height '5\' 2"'$  (1.575 m), weight 170 lb (77.111 kg), last menstrual period 08/20/2015, SpO2 99 %. Physical Exam  Constitutional: She is oriented to person, place, and time. No distress.  Eyes: No scleral icterus.  Neck: No JVD present.  Cardiovascular: Normal rate and regular rhythm.   No murmur heard. Respiratory: No respiratory distress. She has no wheezes.  GI: She exhibits no distension.  Musculoskeletal: She exhibits no edema.  Neurological: She is alert and oriented to person, place, and time.    Assessment/Plan: Problem #1 acute kidney injury: Her creatinine was 0.47 on 10/30/2015. Her creatinine increased to 1.38 on 10/31/2015. Patient had CT scan with contrast on 10/29/2015. Since her creatinine has increased with an 48 hours of IV contrast this could be secondary to contrast-induced acute renal failure. However patient also with history of sepsis and on vancomycin possibly or may contribute to her etiology. Presently her BUN and creatinine still continue but the rate of increase has slowed down. Patient is non-oliguric. Problem #2 history of bipolar disorder Problem #3 history of depression Problem #4 history of gluteal abscess Problem #5 hypokalemia: Most likely secondary to diuretics and also vomiting. Problem #6 nausea and vomiting: Etiology not clear. She has nausea and vomiting for some time. Plan: 1] We'll change her IV fluid to half normal saline with 40 mEq of KCl at 135 mL per hour 2] we'll increase Lasix to 40 mg IV twice a day 3] we'll check her basic metabolic panel in the morning.  Rachel Sellers 11/03/2015, 9:27 AM

## 2015-11-03 NOTE — Progress Notes (Signed)
Pt is still complaining of nausea and vomiting despite phenergen and zofran. Pt also requesting something for sleep.Text paged mid level Nixon. No new orders at this time. Will continue to monitor

## 2015-11-03 NOTE — Progress Notes (Signed)
[facility] PROGRESS NOTE  Rachel Sellers:[id number] DOB: 10-04-1990    PCP:   No primary care provider on file.   HPI:  25 year old female who has a past medical history of Bipolar 1 disorder (HCC); Anxiety; OCD (obsessive compulsive disorder); Depression; and Renal disorder.admitted for buttock cellulitis on Zosyn and IV Rachel Sellers, and was taken to the OR today for I and D. She said she felt better. The IV site is slightly red and another IV site was started. She has some nausea and vomiting, but has improved with changing of narcotics to Fentanyl. She was ready for discharge, however, her Cr rose to 3.13. She was given IVF aggressively, Van/Zosyn IV was discontinued, changed to oral Cipro and Flagyl, and Nephrology was consulted.  Dr Adalberto Cole changed her IVF, gave IV Lasix, and suspect multiple etiology including contrast nephropathy and volume depletion. Renal US showed no hydronephrosis.  Rewiew of Systems:  Constitutional: Negative for malaise, fever and chills. No significant weight loss or weight gain Eyes: Negative for eye pain, redness and discharge, diplopia, visual changes, or flashes of light. ENMT: Negative for ear pain, hoarseness, nasal congestion, sinus pressure and sore throat. No headaches; tinnitus, drooling, or problem swallowing. Cardiovascular: Negative for chest pain, palpitations, diaphoresis, dyspnea and peripheral edema. ; No orthopnea, PND Respiratory: Negative for cough, hemoptysis, wheezing and stridor. No pleuritic chestpain. Gastrointestinal: Negative for nausea, vomiting, diarrhea, constipation, abdominal pain, melena, blood in stool, hematemesis, jaundice and rectal bleeding.    Genitourinary: Negative for frequency, dysuria, incontinence,flank pain and hematuria; Musculoskeletal: Negative for back pain and neck pain. Negative for swelling and trauma.;  Skin: . Negative for pruritus, rash, abrasions, bruising and skin lesion.; ulcerations Neuro:  Negative for headache, lightheadedness and neck stiffness. Negative for weakness, altered level of consciousness , altered mental status, extremity weakness, burning feet, involuntary movement, seizure and syncope.  Psych: negative for anxiety, depression, insomnia, tearfulness, panic attacks, hallucinations, paranoia, suicidal or homicidal ideation   Past Medical History  Diagnosis Date  . Bipolar 1 disorder (HCC)   . Anxiety   . OCD (obsessive compulsive disorder)   . Depression   . Renal disorder     kidney stones    Past Surgical History  Procedure Laterality Date  . Incision and drainage abscess Left 10/30/2015    Procedure: INCISION AND DRAINAGE PERI-RECTAL ABSCESS;  Surgeon: Franky Macho, MD;  Location: [facility];  Service: General;  Laterality: Left;    Medications:  HOME MEDS: Prior to Admission medications   Medication Sig Start Date End Date Taking? Authorizing Provider  citalopram (CELEXA) 20 MG tablet Take 20 mg by mouth daily.   Yes Historical Provider, MD  hydrOXYzine (ATARAX/VISTARIL) 25 MG tablet Take 25 mg by mouth 2 (two) times daily as needed for anxiety.   Yes Historical Provider, MD     Allergies:  No Known Allergies  Social History:   reports that she has been smoking.  She does not have any smokeless tobacco history on file. She reports that she drinks alcohol. She reports that she does not use illicit drugs.  Family History: History reviewed. No pertinent family history.   Physical Exam: Filed Vitals:   11/02/15 0544 11/02/15 2300 11/03/15 0643 11/03/15 1402  BP: 132/83 130/80 135/84 124/95  Pulse: 69 69 69 62  Temp: 98 F (36.7 C) 98.4 F (36.9 C) 97.6 F (36.4 C) 97.6 F (36.4 C)  TempSrc: Oral Oral Oral Oral  Resp: Height:  Weight:      SpO2: 97% 100% 99% 100%   Blood pressure 124/95, pulse 62, temperature 97.6 F (36.4 C), temperature source Oral, resp. rate 18, height  (1.575 m), weight 77.111 kg (170 lb), last  menstrual period 08/20/2015, SpO2 100 %.  GEN:  Pleasant patient lying in the stretcher in no acute distress; cooperative with exam. PSYCH:  alert and oriented x4; does not appear anxious or depressed; affect is appropriate. HEENT: Mucous membranes pink and anicteric; PERRLA; EOM intact; no cervical lymphadenopathy nor thyromegaly or carotid bruit; no JVD; There were no stridor. Neck is very supple. Breasts:: Not examined CHEST WALL: No tenderness CHEST: Normal respiration, clear to auscultation bilaterally.  HEART: Regular rate and rhythm.  There are no murmur, rub, or gallops.   BACK: No kyphosis or scoliosis; no CVA tenderness ABDOMEN: soft and non-tender; no masses, no organomegaly, normal abdominal bowel sounds; no pannus; no intertriginous candida. There is no rebound and no distention. Rectal Exam: Not done EXTREMITIES: No bone or joint deformity; age-appropriate arthropathy of the hands and knees; no edema; no ulcerations.  There is no calf tenderness. Genitalia: not examined PULSES: 2+ and symmetric SKIN: Normal hydration no rash or ulceration CNS: Cranial nerves 2-12 grossly intact no focal lateralizing neurologic deficit.  Speech is fluent; uvula elevated with phonation, facial symmetry and tongue midline. DTR are normal bilaterally, cerebella exam is intact, barbinski is negative and strengths are equaled bilaterally.  No sensory loss.   Labs on Admission:  Basic Metabolic Panel:  Recent Labs Lab 10/30/15 1155 10/31/15 0728 11/01/15 0708 11/02/15 0646 11/03/15 0745  NA 140 139 139 141 142  K 3.7 3.6 3.2* 3.1* 3.3*  CL 107 106 106 111 116*  CO2 20* 19*  GLUCOSE 106* 108* 112* 134* 211*  BUN 5* CREATININE 0.47 1.38* 2.77* 3.09* 3.13*  CALCIUM 8.4* 8.4* 8.0* 7.7* 7.8*   Liver Function Tests:  Recent Labs Lab 10/29/15 0853 11/01/15 0708  AST 25 28  ALT 38 29  ALKPHOS 68 68  BILITOT 0.5 0.8  PROT 6.2* 5.8*  ALBUMIN 2.8* 2.5*    CBC:  Recent Labs Lab 10/28/15 2230 10/29/15 0853 10/31/15 0728 11/01/15 0708  WBC 22.2* 16.0* 13.6* 13.1*  NEUTROABS 17.6*  --   --   --   HGB 12.8 10.9* 10.1* 10.4*  HCT 38.1 31.8* 29.4* 31.5*  MCV 88.4 88.6 87.2 89.0  PLT 318 277 306 321    Radiological Exams on Admission: US Renal  11/02/2015  CLINICAL DATA:  Acute kidney injury EXAM: RENAL / URINARY TRACT ULTRASOUND COMPLETE COMPARISON:  CT abdomen 10/29/2015 FINDINGS: Right Kidney: Length: 13.0 cm. Echogenicity within normal limits. No mass or hydronephrosis visualized. Left Kidney: Length: 13.7 cm. Echogenicity within normal limits. No mass or hydronephrosis visualized. Bladder: Appears normal for degree of bladder distention. Small amount of free fluid in the pelvis. IMPRESSION: Normal renal ultrasound.  Small amount of free fluid in the pelvis. Electronically Signed   By: Marlan Palau M.D.   On: 11/02/2015 10:50   Assessment/Plan Present on Admission:  . Sepsis (HCC) . Left buttock abscess   PLAN:  Buttock abscess: S/p I and D. Will change antibiotic to Cipro and Flagyl orally per surgery's recommendation.   AKI: Suspect Vancomycin, with sepsis, volume depletion from N/V, and contrast nephropathy. Renal US showed no hydronephrosis. Wil continue aggressive rehydration. Nephrology consulted.  Hold discharge. Repeat Bmet tomorrow.  Hope for resolution.  Bipolar/Depression:  Continue home meds.   Dysphagia:  Will treat for candida esophagitis.   ? From nausea and vomiting as well.    Other plans as per orders. Code Status: FULL Unk Lightning, MD.  FACP [facility] Pager [phone] 7pm to 7am.  11/03/2015, 5:06 PM

## 2015-11-04 LAB — CULTURE, ROUTINE-ABSCESS

## 2015-11-04 LAB — ANAEROBIC CULTURE

## 2015-11-04 LAB — BASIC METABOLIC PANEL
ANION GAP: 10 (ref 5–15)
BUN: 13 mg/dL (ref 6–20)
CHLORIDE: 112 mmol/L — AB (ref 101–111)
CO2: 20 mmol/L — ABNORMAL LOW (ref 22–32)
Calcium: 8.1 mg/dL — ABNORMAL LOW (ref 8.9–10.3)
Creatinine, Ser: 3.12 mg/dL — ABNORMAL HIGH (ref 0.44–1.00)
GFR calc Af Amer: 23 mL/min — ABNORMAL LOW (ref >60)
GFR, EST NON AFRICAN AMERICAN: 20 mL/min — AB (ref >60)
GLUCOSE: 105 mg/dL — AB (ref 65–99)
POTASSIUM: 3.4 mmol/L — AB (ref 3.5–5.1)
Sodium: 142 mmol/L (ref 135–145)

## 2015-11-04 NOTE — Progress Notes (Signed)
Subjective: Patient still with some nausea but claims she is feeling much better. She didn't have any vomiting this morning. Patient denies any difficulty in breathing.   Objective: Vital signs in last 24 hours: Temp:  [97.6 F (36.4 C)-98.2 F (36.8 C)] 97.8 F (36.6 C) (02/20 0651) Pulse Rate:  [62-97] 82 (02/20 0651) Resp:  [16-18] 17 (02/20 0651) BP: (117-142)/(69-95) 142/81 mmHg (02/20 0651) SpO2:  [96 %-100 %] 97 % (02/20 0651)  Intake/Output from previous day: 02/19 0701 - 02/20 0700 In: 2718 [P.O.:360; I.V.:2358] Out: 250 [Urine:250] Intake/Output this shift:    No results for input(s): HGB in the last 72 hours. No results for input(s): WBC, RBC, HCT, PLT in the last 72 hours.  Recent Labs  11/03/15 0745 11/04/15 0657  NA 142 142  K 3.3* 3.4*  CL 116* 112*  CO2 19* 20*  BUN 13 13  CREATININE 3.13* 3.12*  GLUCOSE 211* 105*  CALCIUM 7.8* 8.1*   No results for input(s): LABPT, INR in the last 72 hours.  Generally patient is alert and in no apparent distress Chest is clear to auscultation Heart heart exam regular rate and rhythm no murmur Extremities no edema  Assessment/Plan: Problem #1 acute kidney injury: Was likely secondary to dye-induced acute renal failure/ATN/prerenal. Her renal function at this moment has stabilized. Patient is on hydration. She doesn't have any sign of fluid overload. Problem #2 hypokalemia: Her potassium is 3.4 improving Problem #3 nausea and vomiting presently. Etiology not clear. Patient says that she is improving. Problem #4 sepsis: Presently patient is a febrile. Patient is on antibiotics. Problem #5 history of bipolar disorder Plan: We'll continue his present management We'll check her basic metabolic panel in the morning.   Akyah Lagrange S 11/04/2015, 8:21 AM

## 2015-11-04 NOTE — Progress Notes (Signed)
[facility] PROGRESS NOTE  Rachel Sellers JWJ:[id number] DOB: August 16, 1991    PCP:   No primary care provider on file.   HPI:  25 year old female who has a past medical history of Bipolar 1 disorder (HCC); Anxiety; OCD (obsessive compulsive disorder); Depression; and Renal disorder.admitted for buttock cellulitis on Zosyn and IV [name], and was taken to the OR today for I and D. She said she felt better. The IV site is slightly red and another IV site was started. She has some nausea and vomiting, but has improved with changing of narcotics to Fentanyl. She was ready for discharge, however, her Cr rose to 3.13. She was given IVF aggressively, Van/Zosyn IV was discontinued, changed to oral Cipro and Flagyl, and Nephrology was consulted. Dr Adalberto Cole changed her IVF, gave IV Lasix, and suspect multiple etiology including contrast nephropathy and volume depletion. Renal US showed no hydronephrosis.  Her Cr has reached plateau, but not improved.  Dr Leretha Pol felt Flagyl can be discontinued.   Rewiew of Systems:  Constitutional: Negative for malaise, fever and chills. No significant weight loss or weight gain Eyes: Negative for eye pain, redness and discharge, diplopia, visual changes, or flashes of light. ENMT: Negative for ear pain, hoarseness, nasal congestion, sinus pressure and sore throat. No headaches; tinnitus, drooling, or problem swallowing. Cardiovascular: Negative for chest pain, palpitations, diaphoresis, dyspnea and peripheral edema. ; No orthopnea, PND Respiratory: Negative for cough, hemoptysis, wheezing and stridor. No pleuritic chestpain. Gastrointestinal: Negative for  diarrhea, constipation, abdominal pain, melena, blood in stool, hematemesis, jaundice and rectal bleeding.    Genitourinary: Negative for frequency, dysuria, incontinence,flank pain and hematuria; Musculoskeletal: Negative for back pain and neck pain. Negative for swelling and trauma.;  Skin: . Negative for  pruritus, rash, abrasions, bruising and skin lesion.; ulcerations Neuro: Negative for headache, lightheadedness and neck stiffness. Negative for weakness, altered level of consciousness , altered mental status, extremity weakness, burning feet, involuntary movement, seizure and syncope.  Psych: negative for anxiety, depression, insomnia, tearfulness, panic attacks, hallucinations, paranoia, suicidal or homicidal ideation   Past Medical History  Diagnosis Date  . Bipolar 1 disorder (HCC)   . Anxiety   . OCD (obsessive compulsive disorder)   . Depression   . Renal disorder     kidney stones    Past Surgical History  Procedure Laterality Date  . Incision and drainage abscess Left 10/30/2015    Procedure: INCISION AND DRAINAGE PERI-RECTAL ABSCESS;  Surgeon: Franky Macho, MD;  Location: [facility];  Service: General;  Laterality: Left;    Medications:  HOME MEDS: Prior to Admission medications   Medication Sig Start Date End Date Taking? Authorizing Provider  citalopram (CELEXA) 20 MG tablet Take 20 mg by mouth daily.   Yes Historical Provider, MD  hydrOXYzine (ATARAX/VISTARIL) 25 MG tablet Take 25 mg by mouth 2 (two) times daily as needed for anxiety.   Yes Historical Provider, MD     Allergies:  No Known Allergies  Social History:   reports that she has been smoking.  She does not have any smokeless tobacco history on file. She reports that she drinks alcohol. She reports that she does not use illicit drugs.  Family History: History reviewed. No pertinent family history.   Physical Exam: Filed Vitals:   11/03/15 0643 11/03/15 1402 11/03/15 2224 11/04/15 0651  BP: 135/84 124/95 117/69 142/81  Pulse: 69 62 97 82  Temp: 97.6 F (36.4 C) 97.6 F (36.4 C) 98.2 F (36.8 C) 97.8 F (36.6 C)  TempSrc: Oral Oral Oral Oral  Resp: Height:      Weight:      SpO2: 99% 100% 96% 97%   Blood pressure 142/81, pulse 82, temperature 97.8 F (36.6 C), temperature source  Oral, resp. rate 17, height  (1.575 m), weight 77.111 kg (170 lb), last menstrual period 08/20/2015, SpO2 97 %.  GEN:  Pleasant  patient lying in the stretcher in no acute distress; cooperative with exam. PSYCH:  alert and oriented x4; does not appear anxious or depressed; affect is appropriate. HEENT: Mucous membranes pink and anicteric; PERRLA; EOM intact; no cervical lymphadenopathy nor thyromegaly or carotid bruit; no JVD; There were no stridor. Neck is very supple. Breasts:: Not examined CHEST WALL: No tenderness CHEST: Normal respiration, clear to auscultation bilaterally.  HEART: Regular rate and rhythm.  There are no murmur, rub, or gallops.   BACK: No kyphosis or scoliosis; no CVA tenderness ABDOMEN: soft and non-tender; no masses, no organomegaly, normal abdominal bowel sounds; no pannus; no intertriginous candida. There is no rebound and no distention. Rectal Exam: Not done EXTREMITIES: No bone or joint deformity; age-appropriate arthropathy of the hands and knees; no edema; no ulcerations.  There is no calf tenderness. Genitalia: not examined PULSES: 2+ and symmetric SKIN: Normal hydration no rash or ulceration CNS: Cranial nerves 2-12 grossly intact no focal lateralizing neurologic deficit.  Speech is fluent; uvula elevated with phonation, facial symmetry and tongue midline. DTR are normal bilaterally, cerebella exam is intact, barbinski is negative and strengths are equaled bilaterally.  No sensory loss.   Labs on Admission:  Basic Metabolic Panel:  Recent Labs Lab 10/31/15 0728 11/01/15 0708 11/02/15 0646 11/03/15 0745 11/04/15 0657  NA 139 139 141 142 142  K 3.6 3.2* 3.1* 3.3* 3.4*  CL 106 106 111 116* 112*  CO2 25 23 20* 19* 20*  GLUCOSE 108* 112* 134* 211* 105*  BUN CREATININE 1.38* 2.77* 3.09* 3.13* 3.12*  CALCIUM 8.4* 8.0* 7.7* 7.8* 8.1*   Liver Function Tests:  Recent Labs Lab 10/29/15 0853 11/01/15 0708  AST 25 28  ALT 38 29   ALKPHOS 68 68  BILITOT 0.5 0.8  PROT 6.2* 5.8*  ALBUMIN 2.8* 2.5*   CBC:  Recent Labs Lab 10/28/15 2230 10/29/15 0853 10/31/15 0728 11/01/15 0708  WBC 22.2* 16.0* 13.6* 13.1*  NEUTROABS 17.6*  --   --   --   HGB 12.8 10.9* 10.1* 10.4*  HCT 38.1 31.8* 29.4* 31.5*  MCV 88.4 88.6 87.2 89.0  PLT 318 277 306 321   PLAN:  Buttock abscess: S/p I and D. Improved, will d/c Flagyl now.   Continue with Cipro.  AKI: Suspect Vancomycin, with sepsis, volume depletion from N/V, and contrast nephropathy. Renal US showed no hydronephrosis. Wil continue aggressive rehydration. Nephrology consulted. Hold discharge. Repeat Bmet tomorrow. Hope for resolution.  Bipolar/Depression: Continue home meds.   Dysphagia: Will treat for candida esophagitis. ? From nausea and vomiting as well.  Will d/c her Fentanyl today.    Other plans as per orders. Code Status: FULL CODE>   Houston Siren, MD.  FACP [facility] Pager [phone] 7pm to 7am.  11/04/2015, 5:25 PM

## 2015-11-04 NOTE — Progress Notes (Signed)
5 Days Post-Op  Subjective: Left buttock much improved. Minimal drainage noted.  Objective: Vital signs in last 24 hours: Temp:  [97.6 F (36.4 C)-98.2 F (36.8 C)] 97.8 F (36.6 C) (02/20 0651) Pulse Rate:  [62-97] 82 (02/20 0651) Resp:  [16-18] 17 (02/20 0651) BP: (117-142)/(69-95) 142/81 mmHg (02/20 0651) SpO2:  [96 %-100 %] 97 % (02/20 0651) Last BM Date: 11/03/15  Intake/Output from previous day: 02/19 0701 - 02/20 0700 In: 2718 [P.O.:360; I.V.:2358] Out: 250 [Urine:250] Intake/Output this shift:    General appearance: alert, cooperative and no distress Pelvic: Left buttock wound almost healed. Minimal drainage noted. Minimal induration noted. Nontender to touch.  Lab Results:  No results for input(s): WBC, HGB, HCT, PLT in the last 72 hours. BMET  Recent Labs  11/03/15 0745 11/04/15 0657  NA 142 142  K 3.3* 3.4*  CL 116* 112*  CO2 19* 20*  GLUCOSE 211* 105*  BUN 13 13  CREATININE 3.13* 3.12*  CALCIUM 7.8* 8.1*   PT/INR No results for input(s): LABPROT, INR in the last 72 hours.  Studies/Results: US Renal  11/02/2015  CLINICAL DATA:  Acute kidney injury EXAM: RENAL / URINARY TRACT ULTRASOUND COMPLETE COMPARISON:  CT abdomen 10/29/2015 FINDINGS: Right Kidney: Length: 13.0 cm. Echogenicity within normal limits. No mass or hydronephrosis visualized. Left Kidney: Length: 13.7 cm. Echogenicity within normal limits. No mass or hydronephrosis visualized. Bladder: Appears normal for degree of bladder distention. Small amount of free fluid in the pelvis. IMPRESSION: Normal renal ultrasound.  Small amount of free fluid in the pelvis. Electronically Signed   By: Marlan Palau M.D.   On: 11/02/2015 10:50    Anti-infectives: Anti-infectives    Start     Dose/Rate Route Frequency Ordered Stop   11/01/15 0930  ciprofloxacin (CIPRO) tablet 500 mg     500 mg Oral 2 times daily 11/01/15 0918 11/11/15 0759   11/01/15 0930  metroNIDAZOLE (FLAGYL) tablet 500 mg     500 mg  Oral 3 times per day 11/01/15 0918 11/11/15 0559   10/31/15 1200  vancomycin (VANCOCIN) 1,250 mg in sodium chloride 0.9 % 250 mL IVPB  Status:  Discontinued     1,250 mg 166.7 mL/hr over 90 Minutes Intravenous Every 8 hours 10/31/15 0948 11/01/15 1008   10/30/15 0945  piperacillin-tazobactam (ZOSYN) IVPB 3.375 g  Status:  Discontinued     3.375 g 12.5 mL/hr over 240 Minutes Intravenous 3 times per day 10/30/15 0940 11/01/15 1008   10/29/15 1000  vancomycin (VANCOCIN) IVPB 1000 mg/200 mL premix  Status:  Discontinued     1,000 mg 200 mL/hr over 60 Minutes Intravenous Every 8 hours 10/29/15 0852 10/31/15 0948   10/29/15 0900  piperacillin-tazobactam (ZOSYN) IVPB 3.375 g  Status:  Discontinued     3.375 g 12.5 mL/hr over 240 Minutes Intravenous Every 8 hours 10/29/15 0827 10/29/15 1030   10/29/15 0100  vancomycin (VANCOCIN) IVPB 1000 mg/200 mL premix     1,000 mg 200 mL/hr over 60 Minutes Intravenous  Once 10/29/15 0046 10/29/15 0257   10/29/15 0100  piperacillin-tazobactam (ZOSYN) IVPB 3.375 g     3.375 g 100 mL/hr over 30 Minutes Intravenous  Once 10/29/15 0046 10/29/15 0153      Assessment/Plan: s/p Procedure(s): INCISION AND DRAINAGE PERI-RECTAL ABSCESS Impression: Cultures show strep and rare Escherichia coli. Sensitive to ciprofloxacin. May stop Flagyl. Renal insufficiency being addressed by nephrology.  LOS: 6 days    Eulan Heyward A 11/04/2015

## 2015-11-05 LAB — BASIC METABOLIC PANEL
Anion gap: 11 (ref 5–15)
BUN: 20 mg/dL (ref 6–20)
CALCIUM: 8.4 mg/dL — AB (ref 8.9–10.3)
CHLORIDE: 105 mmol/L (ref 101–111)
CO2: 22 mmol/L (ref 22–32)
CREATININE: 2.88 mg/dL — AB (ref 0.44–1.00)
GFR calc Af Amer: 25 mL/min — ABNORMAL LOW (ref >60)
GFR calc non Af Amer: 22 mL/min — ABNORMAL LOW (ref >60)
GLUCOSE: 94 mg/dL (ref 65–99)
Potassium: 3.7 mmol/L (ref 3.5–5.1)
SODIUM: 138 mmol/L (ref 135–145)

## 2015-11-05 MED ORDER — CIPROFLOXACIN HCL 500 MG PO TABS: 500.0000 mg | ORAL_TABLET | Freq: Two times a day (BID) | ORAL | Status: DC

## 2015-11-05 NOTE — Progress Notes (Signed)
Subjective: Patient states that she's feeling much better. She doesn't have any nausea or vomiting today. The last time she had a problem was yesterday. Patient also denies any difficulty breathing.  Objective: Vital signs in last 24 hours: Temp:  [98 F (36.7 C)-98.7 F (37.1 C)] 98.7 F (37.1 C) (02/21 0619) Pulse Rate:  [73-80] 80 (02/21 0619) Resp:  [16] 16 (02/21 0619) BP: (138-143)/(80-87) 143/87 mmHg (02/21 0619) SpO2:  [98 %] 98 % (02/21 0619)  Intake/Output from previous day: 02/20 0701 - 02/21 0700 In: -  Out: 550 [Urine:550] Intake/Output this shift:    No results for input(s): HGB in the last 72 hours. No results for input(s): WBC, RBC, HCT, PLT in the last 72 hours.  Recent Labs  11/03/15 0745 11/04/15 0657  NA 142 142  K 3.3* 3.4*  CL 116* 112*  CO2 19* 20*  BUN 13 13  CREATININE 3.13* 3.12*  GLUCOSE 211* 105*  CALCIUM 7.8* 8.1*   No results for input(s): LABPT, INR in the last 72 hours.  Generally patient is alert and in no apparent distress Chest is clear to auscultation Heart heart exam regular rate and rhythm no murmur Extremities no edema  Assessment/Plan: Problem #1 acute kidney injury: Was likely secondary to dye-induced acute renal failure/ATN/prerenal. Her renal function at this moment has stabilized. Patient is on hydration. She doesn't have any sign of fluid overload. Her renal function is improving Problem #2 hypokalemia: Her potassium is normal Problem #3 nausea and vomiting presently. Etiology not clear. Patient says that she is improving. Problem #4 sepsis: Presently patient is a febrile. Patient is on antibiotics. Problem #5 history of bipolar disorder Plan: 1] We'll continue with hydration and DC Lasix 2] Patient advised to increase her fluid intake. Ok to D/C patient 3]. If patient is going to be discharged checking her basic metabolic panel in 2-3 days probably by her primary care physician seems to be reasonable. 4] we'll see  patient in 2 weeks if she is going to be discharged.   Cochise Dinneen S 11/05/2015, 8:44 AM

## 2015-11-05 NOTE — Discharge Summary (Signed)
Physician Discharge Summary  Rachel Sellers ZOX:[id number] DOB: [birth year] DOA: 10/28/2015  PCP: No primary care provider on file.  Admit date: 10/28/2015 Discharge date: 11/05/2015  Time spent: 35 minutes  Recommendations for Outpatient Follow-up:  1. Follow up with Dr Daphene Calamity next week for AKI. 2. Follow up with Dr Lovell Sheehan for your abscess in the buttock.   Discharge Diagnoses:  Active Problems:   Sepsis (HCC)   Left buttock abscess   Cellulitis of left buttock   Discharge Condition: Improved.  Cr trending down, no nausea vomtiing, and feeling better.  Wound healing well also.   Diet recommendation: cardiac diet.   Filed Weights   10/28/15 1749  Weight: 77.111 kg (170 lb)    History of present illness: Patient was admitted by Dr Sharl Ma on Oct 29, 2015 for buttock abscess.  As per his H and P:  " 25 year old female who  has a past medical history of Bipolar 1 disorder (HCC); Anxiety; OCD (obsessive compulsive disorder); Depression; and Renal disorder. today presents to the hospital with pain and swelling in the left buttock which started 4 days ago. Patient also had some night sweats and nausea. Denies fever. Hasn't had bowel movement for past 2 days and also had difficulty sleeping due to pain. Patient says that she had a small boil noted at the same area last month which had resolved of its own.   Hospital Course: 25 year old female who has a past medical history of Bipolar 1 disorder (HCC); Anxiety; OCD (obsessive compulsive disorder); Depression; and Renal disorder.admitted for buttock cellulitis on Zosyn and IV Van, and was taken to the OR for I and D. She said she felt better. The IV site is slightly red and another IV site was started. She has some nausea and vomiting, but has improved with changing of narcotics to Fentanyl. She was ready for discharge, however, her Cr rose to 3.13. She was given IVF aggressively, Van/Zosyn IV was discontinued, changed to oral Cipro and  Flagyl, and Nephrology was consulted. Dr Adalberto Cole changed her IVF, gave IV Lasix, and suspect multiple etiology including contrast nephropathy and volume depletion. Renal US showed no hydronephrosis. Her Cr has reached plateau, but and has improved to 2.88. Dr Leretha Pol felt Flagyl can be discontinued, and so she is going to be discharged on Cipro alone as her culture grew E Coli sensitve to Cipro.   She will need to have her Cr check next week and appointment was made for her to see Dr Kathlen Brunswick in follow up.  She will see Dr Lovell Sheehan as needed.  She has no insurance.    Procedures:  I and D   Renal US.   Consultations:  Surgery Dr Lovell Sheehan  Nephrology Dr Kathlen Brunswick.   Discharge Exam: Filed Vitals:   11/04/15 1430 11/05/15 0619  BP: 138/80 143/87  Pulse: 73 80  Temp: 98 F (36.7 C) 98.7 F (37.1 C)  Resp:  16    Discharge Instructions   Discharge Instructions    Diet - low sodium heart healthy    Complete by:  As directed      Diet - low sodium heart healthy    Complete by:  As directed      Discharge instructions    Complete by:  As directed   Change your dressing daily.  Do not drink alcohol while on Flagyl.  Sitz bath as per nursing's instruction.  See Dr Lovell Sheehan next week.     Discharge instructions    Complete by:  As directed   Do not take Ibuprofen, NSAIDS, Aleve, naproxen, Advil.  See Dr York Grice next week to check on your kidneys.     Increase activity slowly    Complete by:  As directed      Increase activity slowly    Complete by:  As directed           Current Discharge Medication List    START taking these medications   Details  ciprofloxacin (CIPRO) 500 MG tablet Take 1 tablet (500 mg total) by mouth 2 (two) times daily. Qty: 10 tablet, Refills: 0      CONTINUE these medications which have NOT CHANGED   Details  hydrOXYzine (ATARAX/VISTARIL) 25 MG tablet Take 25 mg by mouth 2 (two) times daily as needed for anxiety.      STOP taking these  medications     citalopram (CELEXA) 20 MG tablet      citalopram (CELEXA) 20 MG tablet        No Known Allergies    The results of significant diagnostics from this hospitalization (including imaging, microbiology, ancillary and laboratory) are listed below for reference.    Significant Diagnostic Studies: Ct Abdomen Pelvis W Contrast  10/29/2015  CLINICAL DATA:  Sudden onset of edema in the left buttock beginning 4 days ago. Fever with night sweats, vomiting, and chills. No bowel movement for 2 days. EXAM: CT ABDOMEN AND PELVIS WITH CONTRAST TECHNIQUE: Multidetector CT imaging of the abdomen and pelvis was performed using the standard protocol following bolus administration of intravenous contrast. CONTRAST:  OMNIPAQUE IOHEXOL 300 MG/ML  SOLN COMPARISON:  03/25/2010 FINDINGS: Lung bases are clear. The liver, spleen, gallbladder, pancreas, adrenal glands, kidneys, abdominal aorta, inferior vena cava, and retroperitoneal lymph nodes are unremarkable. Stomach, small bowel, and colon are not abnormally distended. No free air or free fluid in the abdomen. Abdominal wall musculature appears intact. Pelvis: The appendix is normal. Uterus and ovaries are not enlarged. No free or loculated pelvic fluid collections. No pelvic mass or lymphadenopathy. Bladder wall is not thickened. There is a complex gas and fluid collection in the subcutaneous soft tissues extending from the left perianal region posteriorly to the left buttocks. This is consistent with abscess. The abscess measures about 5.8 x 6.5 cm. There is associated infiltration in the subcutaneous fat over the left buttocks consistent with cellulitis. No destructive bone lesions. IMPRESSION: 5.8 x 6.5 cm abscess in the subcutaneous fat over the left buttocks centrally with extension to the left perianal region. No acute process demonstrated internally within the abdomen or pelvis. Electronically Signed   By: Burman Nieves M.D.   On: 10/29/2015  02:40   US Renal  11/02/2015  CLINICAL DATA:  Acute kidney injury EXAM: RENAL / URINARY TRACT ULTRASOUND COMPLETE COMPARISON:  CT abdomen 10/29/2015 FINDINGS: Right Kidney: Length: 13.0 cm. Echogenicity within normal limits. No mass or hydronephrosis visualized. Left Kidney: Length: 13.7 cm. Echogenicity within normal limits. No mass or hydronephrosis visualized. Bladder: Appears normal for degree of bladder distention. Small amount of free fluid in the pelvis. IMPRESSION: Normal renal ultrasound.  Small amount of free fluid in the pelvis. Electronically Signed   By: Marlan Palau M.D.   On: 11/02/2015 10:50    Microbiology: Recent Results (from the past 240 hour(s))  Culture, Urine     Status: None   Collection Time: 10/28/15 11:00 PM  Result Value Ref Range Status   Specimen Description URINE, CLEAN CATCH  Final   Special Requests NONE  Final   Culture   Final    MULTIPLE SPECIES PRESENT, SUGGEST RECOLLECTION Performed at [facility]    Report Status 10/30/2015 FINAL  Final  Blood culture (routine x 2)     Status: None   Collection Time: 10/29/15  1:00 AM  Result Value Ref Range Status   Specimen Description BLOOD LEFT ANTECUBITAL  Final   Special Requests   Final    BOTTLES DRAWN AEROBIC AND ANAEROBIC AEB=8CC ANA=5CC   Culture NO GROWTH 5 DAYS  Final   Report Status 11/03/2015 FINAL  Final  Blood culture (routine x 2)     Status: None   Collection Time: 10/29/15  1:12 AM  Result Value Ref Range Status   Specimen Description BLOOD LEFT HAND  Final   Special Requests   Final    BOTTLES DRAWN AEROBIC AND ANAEROBIC AEB=6CC ANA=4CC   Culture NO GROWTH 5 DAYS  Final   Report Status 11/03/2015 FINAL  Final  Culture, routine-abscess     Status: None   Collection Time: 10/29/15  7:00 AM  Result Value Ref Range Status   Specimen Description ABSCESS LEFT BUTTOCKS  Final   Special Requests NONE  Final   Gram Stain   Final    ABUNDANT WBC PRESENT, PREDOMINANTLY MONONUCLEAR NO  SQUAMOUS EPITHELIAL CELLS SEEN ABUNDANT GRAM POSITIVE COCCI IN PAIRS IN CLUSTERS ABUNDANT GRAM NEGATIVE RODS Performed at [facility]    Culture   Final    MODERATE STREPTOCOCCUS GROUP D;high probability for S.bovis RARE ESCHERICHIA COLI Performed at [facility]    Report Status 11/02/2015 FINAL  Final   Organism ID, Bacteria STREPTOCOCCUS GROUP D;high probability for S.bovis  Final   Organism ID, Bacteria ESCHERICHIA COLI  Final      Susceptibility   Escherichia coli - MIC*    AMPICILLIN 4 SENSITIVE Sensitive     AMPICILLIN/SULBACTAM <=2 SENSITIVE Sensitive     CEFEPIME <=1 SENSITIVE Sensitive     CEFTAZIDIME <=1 SENSITIVE Sensitive     CEFTRIAXONE <=1 SENSITIVE Sensitive     CIPROFLOXACIN <=0.25 SENSITIVE Sensitive     GENTAMICIN <=1 SENSITIVE Sensitive     IMIPENEM <=0.25 SENSITIVE Sensitive     PIP/TAZO <=4 SENSITIVE Sensitive     TOBRAMYCIN <=1 SENSITIVE Sensitive     TRIMETH/SULFA Value in next row Sensitive      <=20 SENSITIVE(NOTE)    * RARE ESCHERICHIA COLI   Streptococcus group d;high probability for s.bovis - MIC (ETEST)*    PENICILLIN Value in next row Sensitive      <=20 SENSITIVE(NOTE)    * MODERATE STREPTOCOCCUS GROUP D;high probability for S.bovis  MRSA PCR Screening     Status: None   Collection Time: 10/29/15  9:30 AM  Result Value Ref Range Status   MRSA by PCR NEGATIVE NEGATIVE Final    Comment:        The GeneXpert MRSA Assay (FDA approved for NASAL specimens only), is one component of a comprehensive MRSA colonization surveillance program. It is not intended to diagnose MRSA infection nor to guide or monitor treatment for MRSA infections.   Surgical pcr screen     Status: None   Collection Time: 10/29/15  6:20 PM  Result Value Ref Range Status   MRSA, PCR NEGATIVE NEGATIVE Final   Staphylococcus aureus NEGATIVE NEGATIVE Final    Comment:        The Xpert SA Assay (FDA approved for NASAL specimens in patients over 21 years  of age),  is one component of a comprehensive surveillance program.  Test performance has been validated by [facility] for patients greater than or equal to 52 year old. It is not intended to diagnose infection nor to guide or monitor treatment.   Anaerobic culture     Status: None   Collection Time: 10/30/15  9:10 AM  Result Value Ref Range Status   Specimen Description ABSCESS PERIRECTAL  Final   Special Requests NONE  Final   Gram Stain   Final    ABUNDANT WBC PRESENT, PREDOMINANTLY PMN NO SQUAMOUS EPITHELIAL CELLS SEEN ABUNDANT GRAM POSITIVE COCCI IN CLUSTERS MODERATE GRAM NEGATIVE COCCOBACILLI Performed at [facility]    Culture   Final    NO ANAEROBES ISOLATED Performed at [facility]    Report Status 11/04/2015 FINAL  Final  Culture, routine-abscess     Status: None   Collection Time: 10/30/15  9:10 AM  Result Value Ref Range Status   Specimen Description ABSCESS PERIRECTAL  Final   Special Requests NONE  Final   Gram Stain   Final    ABUNDANT WBC PRESENT, PREDOMINANTLY PMN NO SQUAMOUS EPITHELIAL CELLS SEEN ABUNDANT GRAM POSITIVE COCCI IN PAIRS MODERATE GRAM NEGATIVE COCCOBACILLI Performed at [facility]    Culture   Final    FEW ESCHERICHIA COLI FEW STREPTOCOCCUS GROUP D;high probability for S.bovis Performed at [facility]    Report Status 11/04/2015 FINAL  Final   Organism ID, Bacteria ESCHERICHIA COLI  Final   Organism ID, Bacteria STREPTOCOCCUS GROUP D;high probability for S.bovis  Final      Susceptibility   Escherichia coli - MIC*    AMPICILLIN 4 SENSITIVE Sensitive     AMPICILLIN/SULBACTAM <=2 SENSITIVE Sensitive     CEFEPIME <=1 SENSITIVE Sensitive     CEFTAZIDIME <=1 SENSITIVE Sensitive     CEFTRIAXONE <=1 SENSITIVE Sensitive     CIPROFLOXACIN <=0.25 SENSITIVE Sensitive     GENTAMICIN <=1 SENSITIVE Sensitive     IMIPENEM <=0.25 SENSITIVE Sensitive     PIP/TAZO <=4 SENSITIVE Sensitive     TOBRAMYCIN <=1  SENSITIVE Sensitive     TRIMETH/SULFA Value in next row Sensitive      <=20 SENSITIVE(NOTE)    * FEW ESCHERICHIA COLI   Streptococcus group d;high probability for s.bovis - MIC (ETEST)*    PENICILLIN Value in next row Sensitive      <=20 SENSITIVE(NOTE)    * FEW STREPTOCOCCUS GROUP D;high probability for S.bovis     Labs: Basic Metabolic Panel:  Recent Labs Lab 11/01/15 0708 11/02/15 0646 11/03/15 0745 11/04/15 0657 11/05/15 0831  NA 139 141 142 142 138  K 3.2* 3.1* 3.3* 3.4* 3.7  CL 106 111 116* 112* 105  CO2 23 20* 19* 20* 22  GLUCOSE 112* 134* 211* 105* 94  BUN 15 14 13 13 20   CREATININE 2.77* 3.09* 3.13* 3.12* 2.88*  CALCIUM 8.0* 7.7* 7.8* 8.1* 8.4*   Liver Function Tests:  Recent Labs Lab 11/01/15 0708  AST 28  ALT 29  ALKPHOS 68  BILITOT 0.8  PROT 5.8*  ALBUMIN 2.5*   No results for input(s): LIPASE, AMYLASE in the last 168 hours. No results for input(s): AMMONIA in the last 168 hours. CBC:  Recent Labs Lab 10/31/15 0728 11/01/15 0708  WBC 13.6* 13.1*  HGB 10.1* 10.4*  HCT 29.4* 31.5*  MCV 87.2 89.0  PLT 306 321    Signed:  Bohden Dung MD.  [facility] 11/05/2015, 11:35 AM

## 2015-11-05 NOTE — Progress Notes (Signed)
Patient states understanding of discharge instructions.  

## 2015-12-05 ENCOUNTER — Emergency Department (HOSPITAL_COMMUNITY): Payer: Self-pay

## 2015-12-05 ENCOUNTER — Encounter (HOSPITAL_COMMUNITY): Payer: Self-pay | Admitting: Emergency Medicine

## 2015-12-05 ENCOUNTER — Inpatient Hospital Stay (HOSPITAL_COMMUNITY)
Admission: EM | Admit: 2015-12-05 | Discharge: 2015-12-07 | DRG: 394 | Disposition: A | Discharge status: Home / Self Care | Payer: Self-pay | Attending: Internal Medicine | Admitting: Internal Medicine

## 2015-12-05 DIAGNOSIS — D72829 Elevated white blood cell count, unspecified: Secondary | ICD-10-CM | POA: Diagnosis present

## 2015-12-05 DIAGNOSIS — L0291 Cutaneous abscess, unspecified: Secondary | ICD-10-CM | POA: Diagnosis present

## 2015-12-05 DIAGNOSIS — K611 Rectal abscess: Principal | ICD-10-CM | POA: Diagnosis present

## 2015-12-05 DIAGNOSIS — F17200 Nicotine dependence, unspecified, uncomplicated: Secondary | ICD-10-CM | POA: Diagnosis present

## 2015-12-05 DIAGNOSIS — F319 Bipolar disorder, unspecified: Secondary | ICD-10-CM | POA: Diagnosis present

## 2015-12-05 DIAGNOSIS — L03317 Cellulitis of buttock: Secondary | ICD-10-CM | POA: Diagnosis present

## 2015-12-05 DIAGNOSIS — L0231 Cutaneous abscess of buttock: Secondary | ICD-10-CM | POA: Diagnosis present

## 2015-12-05 DIAGNOSIS — F429 Obsessive-compulsive disorder, unspecified: Secondary | ICD-10-CM | POA: Diagnosis present

## 2015-12-05 DIAGNOSIS — D649 Anemia, unspecified: Secondary | ICD-10-CM | POA: Diagnosis present

## 2015-12-05 DIAGNOSIS — L0390 Cellulitis, unspecified: Secondary | ICD-10-CM | POA: Diagnosis present

## 2015-12-05 DIAGNOSIS — A419 Sepsis, unspecified organism: Secondary | ICD-10-CM

## 2015-12-05 LAB — CBC WITH DIFFERENTIAL/PLATELET
BASOS ABS: 0 10*3/uL (ref 0.0–0.1)
BASOS PCT: 0 %
EOS ABS: 0.1 10*3/uL (ref 0.0–0.7)
Eosinophils Relative: 0 %
HCT: 36.6 % (ref 36.0–46.0)
HEMOGLOBIN: 12.5 g/dL (ref 12.0–15.0)
LYMPHS ABS: 2.1 10*3/uL (ref 0.7–4.0)
Lymphocytes Relative: 12 %
MCH: 29.6 pg (ref 26.0–34.0)
MCHC: 34.2 g/dL (ref 30.0–36.0)
MCV: 86.7 fL (ref 78.0–100.0)
Monocytes Absolute: 1 10*3/uL (ref 0.1–1.0)
Monocytes Relative: 6 %
NEUTROS PCT: 82 %
Neutro Abs: 14.1 10*3/uL — ABNORMAL HIGH (ref 1.7–7.7)
Platelets: 277 10*3/uL (ref 150–400)
RBC: 4.22 MIL/uL (ref 3.87–5.11)
RDW: 14.1 % (ref 11.5–15.5)
WBC: 17.3 10*3/uL — AB (ref 4.0–10.5)

## 2015-12-05 LAB — BASIC METABOLIC PANEL
ANION GAP: 10 (ref 5–15)
BUN: 12 mg/dL (ref 6–20)
CALCIUM: 9.4 mg/dL (ref 8.9–10.3)
CHLORIDE: 104 mmol/L (ref 101–111)
CO2: 23 mmol/L (ref 22–32)
CREATININE: 0.81 mg/dL (ref 0.44–1.00)
GFR calc non Af Amer: >60 mL/min (ref >60)
Glucose, Bld: 111 mg/dL — ABNORMAL HIGH (ref 65–99)
Potassium: 3.6 mmol/L (ref 3.5–5.1)
SODIUM: 137 mmol/L (ref 135–145)

## 2015-12-05 LAB — POC URINE PREG, ED: PREG TEST UR: NEGATIVE

## 2015-12-05 LAB — I-STAT CG4 LACTIC ACID, ED: LACTIC ACID, VENOUS: 1.32 mmol/L (ref 0.5–2.0)

## 2015-12-05 MED ORDER — SODIUM CHLORIDE 0.9 % IV SOLN
INTRAVENOUS | Status: AC
  Administered 2015-12-05: 10:00:00 via INTRAVENOUS

## 2015-12-05 MED ORDER — SODIUM CHLORIDE 0.9 % IV BOLUS (SEPSIS)
1000.0000 mL | Freq: Once | INTRAVENOUS | Status: AC
Start: 2015-12-05 — End: 2015-12-05
  Administered 2015-12-05: 1000 mL via INTRAVENOUS

## 2015-12-05 MED ORDER — CEFTRIAXONE SODIUM 1 G IJ SOLR
1.0000 g | Freq: Once | INTRAMUSCULAR | Status: AC
  Administered 2015-12-05: 1 g via INTRAVENOUS
  Filled 2015-12-05: qty 10

## 2015-12-05 MED ORDER — ACETAMINOPHEN 650 MG RE SUPP: 650.0000 mg | Freq: Four times a day (QID) | RECTAL | Status: DC | PRN

## 2015-12-05 MED ORDER — ONDANSETRON HCL 4 MG/2ML IJ SOLN: 4.0000 mg | Freq: Four times a day (QID) | INTRAMUSCULAR | Status: DC | PRN

## 2015-12-05 MED ORDER — ENOXAPARIN SODIUM 40 MG/0.4ML ~~LOC~~ SOLN
40.0000 mg | SUBCUTANEOUS | Status: DC
  Administered 2015-12-05: 40 mg via SUBCUTANEOUS
  Filled 2015-12-05 (×2): qty 0.4

## 2015-12-05 MED ORDER — ONDANSETRON HCL 4 MG PO TABS: 4.0000 mg | ORAL_TABLET | Freq: Four times a day (QID) | ORAL | Status: DC | PRN

## 2015-12-05 MED ORDER — CLINDAMYCIN PHOSPHATE 900 MG/50ML IV SOLN
900.0000 mg | Freq: Three times a day (TID) | INTRAVENOUS | Status: DC
  Administered 2015-12-05 – 2015-12-07 (×6): 900 mg via INTRAVENOUS
  Filled 2015-12-05 (×12): qty 50

## 2015-12-05 MED ORDER — CLINDAMYCIN PHOSPHATE 600 MG/50ML IV SOLN
600.0000 mg | Freq: Once | INTRAVENOUS | Status: AC
  Administered 2015-12-05: 600 mg via INTRAVENOUS
  Filled 2015-12-05: qty 50

## 2015-12-05 MED ORDER — ACETAMINOPHEN 325 MG PO TABS: 650.0000 mg | ORAL_TABLET | Freq: Four times a day (QID) | ORAL | Status: DC | PRN

## 2015-12-05 MED ORDER — LORAZEPAM 1 MG PO TABS
1.0000 mg | ORAL_TABLET | Freq: Once | ORAL | Status: AC
  Administered 2015-12-05: 1 mg via ORAL
  Filled 2015-12-05: qty 1

## 2015-12-05 MED ORDER — POTASSIUM CHLORIDE IN NACL 20-0.9 MEQ/L-% IV SOLN
INTRAVENOUS | Status: DC
  Administered 2015-12-05 (×2): via INTRAVENOUS

## 2015-12-05 MED ORDER — SODIUM CHLORIDE 0.9 % IV SOLN
INTRAVENOUS | Status: DC
  Administered 2015-12-05: 07:00:00 via INTRAVENOUS

## 2015-12-05 MED ORDER — AZTREONAM 2 G IJ SOLR
2.0000 g | Freq: Three times a day (TID) | INTRAMUSCULAR | Status: DC
  Administered 2015-12-05 – 2015-12-07 (×7): 2 g via INTRAVENOUS
  Filled 2015-12-05 (×13): qty 2

## 2015-12-05 MED ORDER — ACETAMINOPHEN 500 MG PO TABS
1000.0000 mg | ORAL_TABLET | Freq: Once | ORAL | Status: AC
  Administered 2015-12-05: 1000 mg via ORAL
  Filled 2015-12-05: qty 2

## 2015-12-05 MED ORDER — IBUPROFEN 600 MG PO TABS
600.0000 mg | ORAL_TABLET | Freq: Four times a day (QID) | ORAL | Status: DC | PRN
  Administered 2015-12-06: 600 mg via ORAL
  Filled 2015-12-05: qty 1

## 2015-12-05 NOTE — Progress Notes (Signed)
Pharmacy Antibiotic Note  Liliana ClineBrittany N Yniguez is a 25 y.o. female admitted on 12/05/2015 with cellulitis.  Pharmacy has been consulted for aztreonam dosing.  Plan: Aztreonam 2 gm IV q8 hours F/u cultures and clinical course  Height: 5\' 2"  (157.5 cm) Weight: 165 lb (74.844 kg) IBW/kg (Calculated) : 50.1  Temp (24hrs), Avg:98.9 F (37.2 C), Min:97.7 F (36.5 C), Max:100.5 F (38.1 C)   Recent Labs Lab 12/05/15 0510 12/05/15 0527  WBC 17.3*  --   CREATININE 0.81  --   LATICACIDVEN  --  1.32    Estimated Creatinine Clearance: 100.6 mL/min (by C-G formula based on Cr of 0.81).    No Known Allergies  Antimicrobials this admission: aztreonam 3/23 >>  clincdamycin 3/23 >>    Thank you for allowing pharmacy to be a part of this patient's care.  Woodfin GanjaSeay, Nely Dedmon Poteet 12/05/2015 8:39 AM

## 2015-12-05 NOTE — ED Notes (Addendum)
Pt states she had an abscess drained on left buttock and was hospitalized last month for sepsis. Reports she noticed some yellow drainage with blood when she wipes that started yesterday.

## 2015-12-05 NOTE — ED Notes (Signed)
Attempted to give report, RN unavailable.

## 2015-12-05 NOTE — ED Notes (Signed)
MD at bedside. 

## 2015-12-05 NOTE — ED Notes (Signed)
Dr. Jenkins at bedside. 

## 2015-12-05 NOTE — ED Provider Notes (Signed)
TIME SEEN: 4:30 AM  CHIEF COMPLAINT: Left buttock pain and drainage  HPI: Pt is a 25 y.o. female with history of bipolar disorder, anxiety, kidney stones who presents emergency department with concerns of left buttock pain and drainage when she cut up to use the bathroom and saw yellow, bloody drainage on the toilet paper. Was admitted to the hospital on February 13 for a large left buttock abscess and cellulitis and was found to be septic. During that admission she had acute kidney injury and her creatinine was elevated to 3. She is being followed by an outpatient nephrologist. States that she had been feeling well and had finished antibiotics, Cipro, just after discharge and states that her symptoms restarted tonight. No known fever that she is aware of. No nausea, vomiting or diarrhea. She is not a diabetic or immunocompromised.  ROS: See HPI Constitutional: no fever  Eyes: no drainage  ENT: no runny nose   Cardiovascular:  no chest pain  Resp: no SOB  GI: no vomiting GU: no dysuria Integumentary: no rash  Allergy: no hives  Musculoskeletal: no leg swelling  Neurological: no slurred speech ROS otherwise negative  PAST MEDICAL HISTORY/PAST SURGICAL HISTORY:  Past Medical History  Diagnosis Date  . Bipolar 1 disorder (HCC)   . Anxiety   . OCD (obsessive compulsive disorder)   . Depression   . Renal disorder     kidney stones    MEDICATIONS:  Prior to Admission medications   Medication Sig Start Date End Date Taking? Authorizing Provider  ciprofloxacin (CIPRO) 500 MG tablet Take 1 tablet (500 mg total) by mouth 2 (two) times daily. 11/05/15   Houston SirenPeter Le, MD  hydrOXYzine (ATARAX/VISTARIL) 25 MG tablet Take 25 mg by mouth 2 (two) times daily as needed for anxiety.    Historical Provider, MD    ALLERGIES:  No Known Allergies  SOCIAL HISTORY:  Social History  Substance Use Topics  . Smoking status: Current Every Day Smoker -- 0.50 packs/day  . Smokeless tobacco: Not on file  .  Alcohol Use: Yes    FAMILY HISTORY: No family history on file.  EXAM: BP 116/75 mmHg  Pulse 118  Temp(Src) 97.7 F (36.5 C) (Oral)  Resp 20  Ht 5\' 2"  (1.575 m)  Wt 165 lb (74.844 kg)  BMI 30.17 kg/m2  SpO2 98%  LMP  (LMP Unknown) CONSTITUTIONAL: Alert and oriented and responds appropriately to questions. Appears anxious, tearful, febrile, nontoxic HEAD: Normocephalic EYES: Conjunctivae clear, PERRL ENT: normal nose; no rhinorrhea; moist mucous membranes NECK: Supple, no meningismus, no LAD  CARD: Regular and tachycardic; S1 and S2 appreciated; no murmurs, no clicks, no rubs, no gallops RESP: Normal chest excursion without splinting or tachypnea; breath sounds clear and equal bilaterally; no wheezes, no rhonchi, no rales, no hypoxia or respiratory distress, speaking full sentences ABD/GI: Normal bowel sounds; non-distended; soft, non-tender, no rebound, no guarding, no peritoneal signs RECTAL:  Normal rectal tone, no gross blood or melena, no hemorrhoids appreciated, nontender rectal exam; patient has a large indurated erythematous area to the left buttock and an open 2 cm area right next to the anus that is draining a large amount of very liquidy foul-smelling purulent drainage; no subcutaneous air emphysema appreciated on exam BACK:  The back appears normal and is non-tender to palpation, there is no CVA tenderness EXT: Normal ROM in all joints; non-tender to palpation; no edema; normal capillary refill; no cyanosis, no calf tenderness or swelling    SKIN: Normal color for  age and race; warm; no rash NEURO: Moves all extremities equally, sensation to light touch intact diffusely, cranial nerves II through XII intact PSYCH: The patient's mood and manner are appropriate. Grooming and personal hygiene are appropriate.  MEDICAL DECISION MAKING: Patient here with recurrent abscess and cellulitis to the left buttock. She is febrile and tachycardic. Previous wound culture grew strep bovis  and Escherichia coli which was pansensitive. Will give dose of IV ceftriaxone. We'll obtain labs, cultures, CT of her pelvis. We'll avoid IV contrast given her history of acute kidney injury during last admission. She is otherwise hemodynamically stable. We'll give IV fluids. She states she feels very anxious. We'll give her IV Ativan. She declines pain medication at this time.  ED PROGRESS: 7 AM  Labs show leukocytosis of 17.3 with left shift. CT scan shows persistent but improved inflammatory stranding with associated gas in the left perianal region posteriorly into the subcutaneous fat of the medial left buttock. No abscess or drainable fluid collection appreciated. Given there is signs of gas on her CT imaging, I have discussed patient's case with Dr. Lovell Sheehan who did take her to the operating room for I&D during her last admission. He thinks that this is less likely necrotizing fasciitis but does recommend broadening her anabolic state IV Azactam and IV clindamycin. He will see her in consult. Recommends medicine admission. Discuss with Dr. Kerry Hough who agrees with admission to medical, inpatient bed. Discussed with patient and family verbalize understanding and are comfortable with this plan. Holding orders per hospice request. Care transferred to hospitalist service.  Will keep her NPO at this time until seen by surgery.    CRITICAL CARE Performed by: Raelyn Number   Total critical care time: 35 minutes  Critical care time was exclusive of separately billable procedures and treating other patients.  Critical care was necessary to treat or prevent imminent or life-threatening deterioration.  Critical care was time spent personally by me on the following activities: development of treatment plan with patient and/or surrogate as well as nursing, discussions with consultants, evaluation of patient's response to treatment, examination of patient, obtaining history from patient or surrogate, ordering  and performing treatments and interventions, ordering and review of laboratory studies, ordering and review of radiographic studies, pulse oximetry and re-evaluation of patient's condition.    Layla Maw Trellis Vanoverbeke, DO 12/05/15 [phone]

## 2015-12-05 NOTE — Consult Note (Signed)
Reason for Consult: Recurrent perirectal abscess Referring Physician: Hospitalist  Rachel Sellers is an 25 y.o. female.  HPI: Patient is a 25 year old white female status post incision and drainage of a perirectal abscess on 10/30/2015 whose postoperative course was remarkable for renal insufficiency most likely secondary to IV contrast. She has been doing well at home until 2 days ago when she began experiencing fullness in the perirectal region. She did have spontaneous drainage of this yesterday evening. She presented emergency room for further evaluation treatment. She states that it is not tender like it was before.  Past Medical History  Diagnosis Date  . Bipolar 1 disorder ([place])   . Anxiety   . OCD (obsessive compulsive disorder)   . Depression   . Renal disorder     kidney stones    Past Surgical History  Procedure Laterality Date  . Incision and drainage abscess Left 10/30/2015    Procedure: INCISION AND DRAINAGE PERI-RECTAL ABSCESS;  Surgeon: Rachel Signs, MD;  Location: [facility];  Service: General;  Laterality: Left;    No family history on file.  Social History:  reports that she has been smoking.  She does not have any smokeless tobacco history on file. She reports that she drinks alcohol. She reports that she uses illicit drugs (Marijuana).  Allergies: No Known Allergies  Medications: I have reviewed the patient's current medications.  Results for orders placed or performed during the hospital encounter of 12/05/15 (from the past 48 hour(s))  CBC with Differential     Status: Abnormal   Collection Time: 12/05/15  5:10 AM  Result Value Ref Range   WBC 17.3 (H) 4.0 - 10.5 K/uL   RBC 4.22 3.87 - 5.11 MIL/uL   Hemoglobin 12.5 12.0 - 15.0 g/dL   HCT 36.6 36.0 - 46.0 %   MCV 86.7 78.0 - 100.0 fL   MCH 29.6 26.0 - 34.0 pg   MCHC 34.2 30.0 - 36.0 g/dL   RDW 14.1 11.5 - 15.5 %   Platelets 277 150 - 400 K/uL   Neutrophils Relative % 82 %   Neutro Abs 14.1 (H) 1.7 - 7.7  K/uL   Lymphocytes Relative 12 %   Lymphs Abs 2.1 0.7 - 4.0 K/uL   Monocytes Relative 6 %   Monocytes Absolute 1.0 0.1 - 1.0 K/uL   Eosinophils Relative 0 %   Eosinophils Absolute 0.1 0.0 - 0.7 K/uL   Basophils Relative 0 %   Basophils Absolute 0.0 0.0 - 0.1 K/uL  Basic metabolic panel     Status: Abnormal   Collection Time: 12/05/15  5:10 AM  Result Value Ref Range   Sodium 137 135 - 145 mmol/L   Potassium 3.6 3.5 - 5.1 mmol/L   Chloride 104 101 - 111 mmol/L   CO2 23 22 - 32 mmol/L   Glucose, Bld 111 (H) 65 - 99 mg/dL   BUN 12 6 - 20 mg/dL   Creatinine, Ser 0.81 0.44 - 1.00 mg/dL   Calcium 9.4 8.9 - 10.3 mg/dL   GFR calc non Af Amer >60 >60 mL/min   GFR calc Af Amer >60 >60 mL/min    Comment: (NOTE) The eGFR has been calculated using the CKD EPI equation. This calculation has not been validated in all clinical situations. eGFR's persistently <60 mL/min signify possible Chronic Kidney Disease.    Anion gap 10 5 - 15  I-Stat CG4 Lactic Acid, ED     Status: None   Collection Time: 12/05/15  5:27  AM  Result Value Ref Range   Lactic Acid, Venous 1.32 0.5 - 2.0 mmol/L  POC urine preg, ED (not at [facility])     Status: None   Collection Time: 12/05/15  5:30 AM  Result Value Ref Range   Preg Test, Ur NEGATIVE NEGATIVE    Comment:        THE SENSITIVITY OF THIS METHODOLOGY IS >24 mIU/mL     Ct Pelvis Wo Contrast  12/05/2015  CLINICAL DATA:  Initial evaluation for acute left buttock abscess. EXAM: CT PELVIS WITHOUT CONTRAST TECHNIQUE: Multidetector CT imaging of the pelvis was performed following the standard protocol without intravenous contrast. COMPARISON:  Prior study from 10/29/2015. FINDINGS: Visualized small bowel is of normal caliber without associated inflammatory changes or evidence of obstruction. Visualized colon within normal limits. Appendix visualized in the right lower quadrant and is normal in caliber and appearance without evidence for acute appendicitis. Bladder  normal. Uterus and ovaries within normal limits for patient age. No free air.  No adenopathy within the pelvis. Visualized osseous structures within normal limits. No worrisome lytic or blastic osseous lesions. Degenerative disc bulge noted at L5-S1. Again seen is abnormal soft tissue stranding extending from the left perianal region posteriorly into the medial aspect of the left buttock. Few scattered locules of gas within this region. There is a larger collection of gas within the medial left buttock measuring 14 x 24 mm. Associated soft tissue stranding with induration within the adjacent subcutaneous fat. Overall, these changes are improved relative to prior CT from 10/29/2015. No frank fluid collection now identified on this noncontrast examination. IMPRESSION: Persistent but improved inflammatory stranding with associated gas extending from the left perianal region posteriorly into the subcutaneous fat of the medial left buttock. No frank abscess or drainable fluid collection appreciated on this noncontrast examination. Electronically Signed   By: Rachel Sellers M.D.   On: 12/05/2015 06:27    ROS: See chart Blood pressure 111/69, pulse 107, temperature 98.5 F (36.9 C), temperature source Rectal, resp. rate 24, height _0  (1.575 m), weight 74.844 kg (165 lb), SpO2 99 %. Physical Exam: Pleasant white female in no acute distress. Rectal examination reveals a small indurated area in the left buttock region. No significant drainage noted at this time. Not particularly tender.  Assessment/Plan: Impression: Recurrent perirectal abscess/cellulitis. CT scan does not reveal any drainable abscess at this time. Given her recent renal injury, would start clindamycin and aztreonam. Will follow with you.  Rachel Sellers A 12/05/2015, 8:52 AM

## 2015-12-05 NOTE — H&P (Signed)
[facility] History and Physical  [name]:[id number] DOB: 20-Jun-1991 DOA: 12/05/2015  Referring physician: Erick Blinks PCP: No primary care provider on file.   Chief Complaint: cellulitis and abscess  HPI: Rachel Sellers is a 25 y.o. female with a hx of Bipolar 1 disorder, Anxiety, depression, and OCD, who presents to the ED with complaints of cellulitis and an abscess on her left buttock. Pt was recently in the hospital last month with similar complaints, and underwent I&D. Since returning home she has done fairly well until the day before yesterday, when she began experiencing tenderness and swelling in the same area.  She describes the abscess as sensitive and hard. She reports swelling and foul smelling drainage that onset around 3 am this morning. She is experiencing much less pain now that it has started draining. She reported a slight fever this am but is now feeling better. She was also feeling dizzy yesterday and earlier today, but these symptoms have since resolved.  While in the ED, workup revealed an elevated WBC of 17.3. Lactic acid was 1.32. A CT abd revealed persistent but improved inflammatory stranding with associated gas. There was no frank abscess or drainable fluid collection noted. Admission has been requested for IV abx.   Review of Systems:  Constitutional:  No weight loss, night sweats, Fevers, chills, fatigue.  HEENT:  No headaches, Difficulty swallowing,Tooth/dental problems,Sore throat,  No sneezing, itching, ear ache, nasal congestion, post nasal drip,  Cardio-vascular:  No chest pain, Orthopnea, PND, swelling in lower extremities, anasarca, dizziness, palpitations  GI:  No heartburn, indigestion, abdominal pain, nausea, vomiting, diarrhea, change in bowel habits, loss of appetite  Resp:  No shortness of breath with exertion or at rest. No excess mucus, no productive cough, No non-productive cough, No coughing up of blood.No change in  color of mucus.No wheezing.No chest wall deformity  Skin:  Cellulitis/abscess on left buttock GU:  no dysuria, change in color of urine, no urgency or frequency. No flank pain.  Musculoskeletal:  No joint pain or swelling. No decreased range of motion. No back pain.  Psych:  No change in mood or affect. No depression or anxiety. No memory loss.   Past Medical History  Diagnosis Date  . Bipolar 1 disorder ([facility])   . Anxiety   . OCD (obsessive compulsive disorder)   . Depression   . Renal disorder     kidney stones   Past Surgical History  Procedure Laterality Date  . Incision and drainage abscess Left 10/30/2015    Procedure: INCISION AND DRAINAGE PERI-RECTAL ABSCESS;  Surgeon: Franky Macho, MD;  Location: [facility];  Service: General;  Laterality: Left;   Social History:  reports that she has been smoking.  She does not have any smokeless tobacco history on file. She reports that she drinks alcohol. She reports that she uses illicit drugs (Marijuana).  No Known Allergies  Family history: no history of recurrent skin infections  Prior to Admission medications   Medication Sig Start Date End Date Taking? Authorizing Provider  ciprofloxacin (CIPRO) 500 MG tablet Take 1 tablet (500 mg total) by mouth 2 (two) times daily. 11/05/15   Houston Siren, MD  hydrOXYzine (ATARAX/VISTARIL) 25 MG tablet Take 25 mg by mouth 2 (two) times daily as needed for anxiety.    Historical Provider, MD   Physical Exam: Filed Vitals:   12/05/15 0730 12/05/15 0800 12/05/15 0830 12/05/15 0852  BP: 116/71 108/75 111/69 128/76  Pulse: 102 101 107 102  Temp:  97.7 F (36.5 C)  TempSrc:    Oral  Resp: 24   20  Height:     (1.575 m)  Weight:      SpO2: 98% 99% 99% 100%    Wt Readings from Last 3 Encounters:  12/05/15 74.844 kg (165 lb)  10/28/15 77.111 kg (170 lb)  10/09/11 72.576 kg (160 lb)    General:  Appears calm and comfortable Eyes: PERRL, normal lids, irises & conjunctiva ENT: grossly  normal hearing, lips & tongue Neck: no LAD, masses or thyromegaly Cardiovascular: RRR, no m/r/g. No LE edema. Telemetry: SR, no arrhythmias  Respiratory: CTA bilaterally, no w/r/r. Normal respiratory effort. Abdomen: soft, ntnd Skin: no significant erythema noted over left buttock. Small area of induration noted, without any significant drainage. No significant tenderness.  Musculoskeletal: grossly normal tone BUE/BLE Psychiatric: grossly normal mood and affect, speech fluent and appropriate Neurologic: grossly non-focal.          Labs on Admission:  Basic Metabolic Panel:  Recent Labs Lab 12/05/15 0510  NA 137  K 3.6  CL 104  CO2 23  GLUCOSE 111*  BUN 12  CREATININE 0.81  CALCIUM 9.4   Liver Function Tests: No results for input(s): AST, ALT, ALKPHOS, BILITOT, PROT, ALBUMIN in the last 168 hours. No results for input(s): LIPASE, AMYLASE in the last 168 hours. No results for input(s): AMMONIA in the last 168 hours. CBC:  Recent Labs Lab 12/05/15 0510  WBC 17.3*  NEUTROABS 14.1*  HGB 12.5  HCT 36.6  MCV 86.7  PLT 277   Cardiac Enzymes: No results for input(s): CKTOTAL, CKMB, CKMBINDEX, TROPONINI in the last 168 hours.  BNP (last 3 results) No results for input(s): BNP in the last 8760 hours.  ProBNP (last 3 results) No results for input(s): PROBNP in the last 8760 hours.  CBG: No results for input(s): GLUCAP in the last 168 hours.  Radiological Exams on Admission: Ct Pelvis Wo Contrast  12/05/2015  CLINICAL DATA:  Initial evaluation for acute left buttock abscess. EXAM: CT PELVIS WITHOUT CONTRAST TECHNIQUE: Multidetector CT imaging of the pelvis was performed following the standard protocol without intravenous contrast. COMPARISON:  Prior study from 10/29/2015. FINDINGS: Visualized small bowel is of normal caliber without associated inflammatory changes or evidence of obstruction. Visualized colon within normal limits. Appendix visualized in the right lower  quadrant and is normal in caliber and appearance without evidence for acute appendicitis. Bladder normal. Uterus and ovaries within normal limits for patient age. No free air.  No adenopathy within the pelvis. Visualized osseous structures within normal limits. No worrisome lytic or blastic osseous lesions. Degenerative disc bulge noted at L5-S1. Again seen is abnormal soft tissue stranding extending from the left perianal region posteriorly into the medial aspect of the left buttock. Few scattered locules of gas within this region. There is a larger collection of gas within the medial left buttock measuring 14 x 24 mm. Associated soft tissue stranding with induration within the adjacent subcutaneous fat. Overall, these changes are improved relative to prior CT from 10/29/2015. No frank fluid collection now identified on this noncontrast examination. IMPRESSION: Persistent but improved inflammatory stranding with associated gas extending from the left perianal region posteriorly into the subcutaneous fat of the medial left buttock. No frank abscess or drainable fluid collection appreciated on this noncontrast examination. Electronically Signed   By: Rise Mu M.D.   On: 12/05/2015 06:27     Assessment/Plan Active Problems:   Cellulitis   1. Cellulitis  and recurrent perirectal abscess. CT did not reveal any drainable fluid at this time. Due to her recent renal injury, she has been started on clindamycin and aztreonam. WBC 17.3. Blood cx results pending. Wound cx results were sent on admission. Will hopefully transition to PO abx tomorrow if she continues to improve. Appreciate general surgery input.  Code Status: Full DVT Prophylaxis: Lovenox Family Communication: family present at bedside Disposition Plan: discharge once improved  Time spent: 55 minutes  Erick BlinksJehanzeb Memon, MD. [facility] Pager [phone]  By signing my name below, I, Adron BeneGreylon Gawaluck, attest that this  documentation has been prepared under the direction and in the presence of Erick BlinksJehanzeb Memon, MD. Electronically Signed: Adron BeneGreylon Gawaluck 12/05/2015 10:21am  I, Dr. Erick BlinksJehanzeb Memon, personally performed the services described in this documentaiton. All medical record entries made by the scribe were at my direction and in my presence. I have reviewed the chart and agree that the record reflects my personal performance and is accurate and complete  Erick BlinksJehanzeb Memon, MD, 12/05/2015 10:51 AM

## 2015-12-06 DIAGNOSIS — L0390 Cellulitis, unspecified: Secondary | ICD-10-CM

## 2015-12-06 DIAGNOSIS — L0231 Cutaneous abscess of buttock: Secondary | ICD-10-CM

## 2015-12-06 DIAGNOSIS — D72829 Elevated white blood cell count, unspecified: Secondary | ICD-10-CM | POA: Diagnosis present

## 2015-12-06 DIAGNOSIS — D649 Anemia, unspecified: Secondary | ICD-10-CM | POA: Diagnosis present

## 2015-12-06 DIAGNOSIS — L0291 Cutaneous abscess, unspecified: Secondary | ICD-10-CM | POA: Insufficient documentation

## 2015-12-06 LAB — BASIC METABOLIC PANEL
Anion gap: 7 (ref 5–15)
BUN: 7 mg/dL (ref 6–20)
CHLORIDE: 111 mmol/L (ref 101–111)
CO2: 21 mmol/L — ABNORMAL LOW (ref 22–32)
Calcium: 8.9 mg/dL (ref 8.9–10.3)
Creatinine, Ser: 0.62 mg/dL (ref 0.44–1.00)
GFR calc non Af Amer: >60 mL/min (ref >60)
Glucose, Bld: 97 mg/dL (ref 65–99)
POTASSIUM: 4.2 mmol/L (ref 3.5–5.1)
SODIUM: 139 mmol/L (ref 135–145)

## 2015-12-06 LAB — CBC
HCT: 32.5 % — ABNORMAL LOW (ref 36.0–46.0)
HEMOGLOBIN: 10.9 g/dL — AB (ref 12.0–15.0)
MCH: 29.3 pg (ref 26.0–34.0)
MCHC: 33.5 g/dL (ref 30.0–36.0)
MCV: 87.4 fL (ref 78.0–100.0)
Platelets: 253 10*3/uL (ref 150–400)
RBC: 3.72 MIL/uL — AB (ref 3.87–5.11)
RDW: 14.2 % (ref 11.5–15.5)
WBC: 7.1 10*3/uL (ref 4.0–10.5)

## 2015-12-06 LAB — HEMOGLOBIN A1C
HEMOGLOBIN A1C: 5.8 % — AB (ref 4.8–5.6)
MEAN PLASMA GLUCOSE: 120 mg/dL

## 2015-12-06 LAB — HIV ANTIBODY (ROUTINE TESTING W REFLEX): HIV Screen 4th Generation wRfx: NONREACTIVE

## 2015-12-06 MED ORDER — SODIUM CHLORIDE 0.9 % IV SOLN
INTRAVENOUS | Status: DC
  Administered 2015-12-06 (×2): via INTRAVENOUS

## 2015-12-06 MED ORDER — LORAZEPAM 0.5 MG PO TABS
0.5000 mg | ORAL_TABLET | Freq: Once | ORAL | Status: AC
  Administered 2015-12-06: 0.5 mg via ORAL
  Filled 2015-12-06: qty 1

## 2015-12-06 MED ORDER — SODIUM CHLORIDE 0.9 % IV SOLN: INTRAVENOUS | Status: DC

## 2015-12-06 NOTE — Progress Notes (Signed)
[facility] PROGRESS NOTE  Rachel Sellers:[id number] DOB: 1990-10-29 DOA: 12/05/2015 PCP: No primary care provider on file.  Assessment/Plan: #1 left recurrent perirectal abscess/cellulitis Clinically improving. Draining. Patient afebrile with clinical improvement. Leukocytosis has improved. Wound cultures pending. Continue empiric IV azactam and IV clindamycin. Pain management. Supportive care. General surgery following and appreciate input and recommendations.  #2 anemia Stable. Outpatient follow-up.  #3 leukocytosis Secondary to problem #1. LFTs trending down. Wound cultures pending. Continue empiric IV antibiotics.  #4 prophylaxis Lovenox for DVT prophylaxis.  Code Status: Full Family Communication: Updated patient and boyfriend at bedside. Disposition Plan: Home once wound cultures and sensitivities have resulted hopefully tomorrow.   Consultants:  Gen. surgery: Dr. Lovell Sheehan 12/05/2015  Procedures:  CT pelvis without contrast 12/05/2015  Antibiotics:  IV Rocephin 12/05/2015>>>> 12/05/2015  IV azactam 12/05/2015  IV clindamycin 12/05/2015  HPI/Subjective: Patient asking when she can go home. Feeling better. No CP. No SOB.  Objective: Filed Vitals:   12/06/15 0729 12/06/15 1650  BP: 103/60 109/77  Pulse: 77 86  Temp: 97.5 F (36.4 C) 97.8 F (36.6 C)  Resp: 20 20    Intake/Output Summary (Last 24 hours) at 12/06/15 1702 Last data filed at 12/06/15 1605  Gross per 24 hour  Intake 810.42 ml  Output      0 ml  Net 810.42 ml   Filed Weights   12/05/15 0425 12/05/15 0852  Weight: 74.844 kg (165 lb) 75.297 kg (166 lb)    Exam:   General:  NAD  Cardiovascular: RRR  Respiratory: CTAB  Abdomen: Soft/NT/ND/+BS  Musculoskeletal: No c/c/e   GU: Draining area left buttock.  Data Reviewed: Basic Metabolic Panel:  Recent Labs Lab 12/05/15 0510 12/06/15 0555  NA 137 139  K 3.6 4.2  CL 104 111  CO2 23 21*  GLUCOSE 111* 97  BUN 12  7  CREATININE 0.81 0.62  CALCIUM 9.4 8.9   Liver Function Tests: No results for input(s): AST, ALT, ALKPHOS, BILITOT, PROT, ALBUMIN in the last 168 hours. No results for input(s): LIPASE, AMYLASE in the last 168 hours. No results for input(s): AMMONIA in the last 168 hours. CBC:  Recent Labs Lab 12/05/15 0510 12/06/15 0555  WBC 17.3* 7.1  NEUTROABS 14.1*  --   HGB 12.5 10.9*  HCT 36.6 32.5*  MCV 86.7 87.4  PLT 277 253   Cardiac Enzymes: No results for input(s): CKTOTAL, CKMB, CKMBINDEX, TROPONINI in the last 168 hours. BNP (last 3 results) No results for input(s): BNP in the last 8760 hours.  ProBNP (last 3 results) No results for input(s): PROBNP in the last 8760 hours.  CBG: No results for input(s): GLUCAP in the last 168 hours.  Recent Results (from the past 240 hour(s))  Wound culture     Status: None (Preliminary result)   Collection Time: 12/05/15  4:52 AM  Result Value Ref Range Status   Specimen Description WOUND SITE NOT SPECIFIED  Final   Special Requests NONE  Final   Gram Stain   Final    ABUNDANT WBC PRESENT,BOTH PMN AND MONONUCLEAR NO SQUAMOUS EPITHELIAL CELLS SEEN FEW GRAM POSITIVE COCCI IN PAIRS IN CLUSTERS FEW GRAM NEGATIVE RODS Performed at [facility]    Culture   Final    Culture reincubated for better growth Performed at [facility]    Report Status PENDING  Incomplete  Blood culture (routine x 2)     Status: None (Preliminary result)   Collection Time: 12/05/15  5:10 AM  Result Value Ref Range Status   Specimen Description BLOOD LEFT ANTECUBITAL DRAWN BY RN  Final   Special Requests BOTTLES DRAWN AEROBIC AND ANAEROBIC 10CC EACH  Final   Culture NO GROWTH 1 DAY  Final   Report Status PENDING  Incomplete     Studies: Ct Pelvis Wo Contrast  12/05/2015  CLINICAL DATA:  Initial evaluation for acute left buttock abscess. EXAM: CT PELVIS WITHOUT CONTRAST TECHNIQUE: Multidetector CT imaging of the pelvis was performed  following the standard protocol without intravenous contrast. COMPARISON:  Prior study from 10/29/2015. FINDINGS: Visualized small bowel is of normal caliber without associated inflammatory changes or evidence of obstruction. Visualized colon within normal limits. Appendix visualized in the right lower quadrant and is normal in caliber and appearance without evidence for acute appendicitis. Bladder normal. Uterus and ovaries within normal limits for patient age. No free air.  No adenopathy within the pelvis. Visualized osseous structures within normal limits. No worrisome lytic or blastic osseous lesions. Degenerative disc bulge noted at L5-S1. Again seen is abnormal soft tissue stranding extending from the left perianal region posteriorly into the medial aspect of the left buttock. Few scattered locules of gas within this region. There is a larger collection of gas within the medial left buttock measuring 14 x 24 mm. Associated soft tissue stranding with induration within the adjacent subcutaneous fat. Overall, these changes are improved relative to prior CT from 10/29/2015. No frank fluid collection now identified on this noncontrast examination. IMPRESSION: Persistent but improved inflammatory stranding with associated gas extending from the left perianal region posteriorly into the subcutaneous fat of the medial left buttock. No frank abscess or drainable fluid collection appreciated on this noncontrast examination. Electronically Signed   By: Rise MuBenjamin  McClintock M.D.   On: 12/05/2015 06:27    Scheduled Meds: . aztreonam  2 g Intravenous Q8H  . clindamycin (CLEOCIN) IV  900 mg Intravenous Q8H  . enoxaparin (LOVENOX) injection  40 mg Subcutaneous Q24H   Continuous Infusions: . sodium chloride Stopped (12/06/15 1605)    Principal Problem:   Left buttock abscess Active Problems:   Cellulitis and abscess   Cellulitis of left buttock   Cellulitis   Anemia   Leukocytosis    Time spent: 35  minutes    THOMPSON,DANIEL M.D. [facility] Pager [phone]. If 7PM-7AM, please contact night-coverage at [email or web address], password [email or web address] 12/06/2015, 5:02 PM  LOS: 1 day

## 2015-12-06 NOTE — Progress Notes (Signed)
Subjective: Patient feels fine. Still able to express some fluid from her left buttock wound.  Objective: Vital signs in last 24 hours: Temp:  [97.5 F (36.4 C)-98.2 F (36.8 C)] 97.5 F (36.4 C) (03/24 0729) Pulse Rate:  [77-84] 77 (03/24 0729) Resp:  [18-20] 20 (03/24 0729) BP: (99-108)/(56-60) 103/60 mmHg (03/24 0729) SpO2:  [98 %-100 %] 99 % (03/24 0729) Last BM Date: 12/04/15  Intake/Output from previous day:   Intake/Output this shift:    General appearance: alert, cooperative and no distress Skin: Small indurated area which is draining in left buttock area. Not particularly tender.  Lab Results:   Recent Labs  12/05/15 0510 12/06/15 0555  WBC 17.3* 7.1  HGB 12.5 10.9*  HCT 36.6 32.5*  PLT 277 253   BMET  Recent Labs  12/05/15 0510 12/06/15 0555  NA 137 139  K 3.6 4.2  CL 104 111  CO2 23 21*  GLUCOSE 111* 97  BUN 12 7  CREATININE 0.81 0.62  CALCIUM 9.4 8.9   PT/INR No results for input(s): LABPROT, INR in the last 72 hours.  Studies/Results: Ct Pelvis Wo Contrast  12/05/2015  CLINICAL DATA:  Initial evaluation for acute left buttock abscess. EXAM: CT PELVIS WITHOUT CONTRAST TECHNIQUE: Multidetector CT imaging of the pelvis was performed following the standard protocol without intravenous contrast. COMPARISON:  Prior study from 10/29/2015. FINDINGS: Visualized small bowel is of normal caliber without associated inflammatory changes or evidence of obstruction. Visualized colon within normal limits. Appendix visualized in the right lower quadrant and is normal in caliber and appearance without evidence for acute appendicitis. Bladder normal. Uterus and ovaries within normal limits for patient age. No free air.  No adenopathy within the pelvis. Visualized osseous structures within normal limits. No worrisome lytic or blastic osseous lesions. Degenerative disc bulge noted at L5-S1. Again seen is abnormal soft tissue stranding extending from the left perianal  region posteriorly into the medial aspect of the left buttock. Few scattered locules of gas within this region. There is a larger collection of gas within the medial left buttock measuring 14 x 24 mm. Associated soft tissue stranding with induration within the adjacent subcutaneous fat. Overall, these changes are improved relative to prior CT from 10/29/2015. No frank fluid collection now identified on this noncontrast examination. IMPRESSION: Persistent but improved inflammatory stranding with associated gas extending from the left perianal region posteriorly into the subcutaneous fat of the medial left buttock. No frank abscess or drainable fluid collection appreciated on this noncontrast examination. Electronically Signed   By: Rise MuBenjamin  McClintock M.D.   On: 12/05/2015 06:27    Anti-infectives: Anti-infectives    Start     Dose/Rate Route Frequency Ordered Stop   12/05/15 1500  clindamycin (CLEOCIN) IVPB 900 mg     900 mg 100 mL/hr over 30 Minutes Intravenous Every 8 hours 12/05/15 1049     12/05/15 0815  aztreonam (AZACTAM) 2 g in dextrose 5 % 50 mL IVPB     2 g 100 mL/hr over 30 Minutes Intravenous Every 8 hours 12/05/15 0808     12/05/15 0700  clindamycin (CLEOCIN) IVPB 600 mg     600 mg 100 mL/hr over 30 Minutes Intravenous  Once 12/05/15 0655 12/05/15 0811   12/05/15 0500  cefTRIAXone (ROCEPHIN) 1 g in dextrose 5 % 50 mL IVPB     1 g 100 mL/hr over 30 Minutes Intravenous  Once 12/05/15 0451 12/05/15 0557      Assessment/Plan: Impression: Left perirectal abscess, resolving.  Leukocytosis resolved. Lan: Anticipate discharge in next 24-48 hours pending culture results. Continue clindamycin and Azactam.  LOS: 1 day    Baden Betsch A 12/06/2015

## 2015-12-07 LAB — WOUND CULTURE

## 2015-12-07 LAB — CBC
HCT: 32 % — ABNORMAL LOW (ref 36.0–46.0)
Hemoglobin: 10.9 g/dL — ABNORMAL LOW (ref 12.0–15.0)
MCH: 29.3 pg (ref 26.0–34.0)
MCHC: 34.1 g/dL (ref 30.0–36.0)
MCV: 86 fL (ref 78.0–100.0)
PLATELETS: 276 10*3/uL (ref 150–400)
RBC: 3.72 MIL/uL — ABNORMAL LOW (ref 3.87–5.11)
RDW: 14 % (ref 11.5–15.5)
WBC: 7.2 10*3/uL (ref 4.0–10.5)

## 2015-12-07 LAB — IRON AND TIBC
Iron: 88 ug/dL (ref 28–170)
Saturation Ratios: 31 % (ref 10.4–31.8)
TIBC: 281 ug/dL (ref 250–450)
UIBC: 193 ug/dL

## 2015-12-07 LAB — BASIC METABOLIC PANEL
Anion gap: 8 (ref 5–15)
BUN: 10 mg/dL (ref 6–20)
CALCIUM: 8.7 mg/dL — AB (ref 8.9–10.3)
CO2: 20 mmol/L — ABNORMAL LOW (ref 22–32)
CREATININE: 0.58 mg/dL (ref 0.44–1.00)
Chloride: 112 mmol/L — ABNORMAL HIGH (ref 101–111)
GFR calc Af Amer: >60 mL/min (ref >60)
GLUCOSE: 98 mg/dL (ref 65–99)
POTASSIUM: 4.1 mmol/L (ref 3.5–5.1)
Sodium: 140 mmol/L (ref 135–145)

## 2015-12-07 LAB — FOLATE: Folate: 15.5 ng/mL (ref >5.9)

## 2015-12-07 LAB — VITAMIN B12: Vitamin B-12: 199 pg/mL (ref 180–914)

## 2015-12-07 LAB — FERRITIN: FERRITIN: 184 ng/mL (ref 11–307)

## 2015-12-07 MED ORDER — IBUPROFEN 200 MG PO TABS: 400.0000 mg | ORAL_TABLET | Freq: Four times a day (QID) | ORAL | Status: DC | PRN

## 2015-12-07 MED ORDER — AMOXICILLIN-POT CLAVULANATE 875-125 MG PO TABS: 1.0000 | ORAL_TABLET | Freq: Two times a day (BID) | ORAL | Status: AC

## 2015-12-07 NOTE — Progress Notes (Signed)
Pt discharged home today per Dr. Janee Mornhompson.  Pt's IV site D/C'd and WDL.  Pt's VSS.  Pt provided with home medication list, discharge instructions and prescriptions.  Verbalized understanding.  Pt to leave floor in stable condition at this time.

## 2015-12-07 NOTE — Progress Notes (Signed)
  Subjective: Left buttock wound with minimal drainage. It is nontender at this time.  Objective: Vital signs in last 24 hours: Temp:  [97.8 F (36.6 C)-98.4 F (36.9 C)] 98.4 F (36.9 C) (03/25 0453) Pulse Rate:  [72-86] 79 (03/25 0453) Resp:  [20] 20 (03/25 0453) BP: (103-109)/(50-77) 104/56 mmHg (03/25 0453) SpO2:  [98 %-100 %] 98 % (03/25 0453) Last BM Date: 12/07/15  Intake/Output from previous day: 03/24 0701 - 03/25 0700 In: 2622.9 [I.V.:2322.9; IV Piggyback:300] Out: -  Intake/Output this shift:    General appearance: alert, cooperative and no distress Skin: Not examined today.  Lab Results:   Recent Labs  12/06/15 0555 12/07/15 0603  WBC 7.1 7.2  HGB 10.9* 10.9*  HCT 32.5* 32.0*  PLT 253 276   BMET  Recent Labs  12/06/15 0555 12/07/15 0603  NA 139 140  K 4.2 4.1  CL 111 112*  CO2 21* 20*  GLUCOSE 97 98  BUN 7 10  CREATININE 0.62 0.58  CALCIUM 8.9 8.7*   PT/INR No results for input(s): LABPROT, INR in the last 72 hours.  Studies/Results: No results found.  Anti-infectives: Anti-infectives    Start     Dose/Rate Route Frequency Ordered Stop   12/05/15 1500  clindamycin (CLEOCIN) IVPB 900 mg     900 mg 100 mL/hr over 30 Minutes Intravenous Every 8 hours 12/05/15 1049     12/05/15 0815  aztreonam (AZACTAM) 2 g in dextrose 5 % 50 mL IVPB     2 g 100 mL/hr over 30 Minutes Intravenous Every 8 hours 12/05/15 0808     12/05/15 0700  clindamycin (CLEOCIN) IVPB 600 mg     600 mg 100 mL/hr over 30 Minutes Intravenous  Once 12/05/15 0655 12/05/15 0811   12/05/15 0500  cefTRIAXone (ROCEPHIN) 1 g in dextrose 5 % 50 mL IVPB     1 g 100 mL/hr over 30 Minutes Intravenous  Once 12/05/15 0451 12/05/15 0557      Assessment/Plan: Impression: Patient with like to go home, which is fine with me. Wound culture still pending. In discussion with Dr. Janee Mornhompson, would continue oral Augmentin as an outpatient. I will see her in my office next week and follow up  with culture results.  LOS: 2 days    Rachel Sellers A 12/07/2015

## 2015-12-07 NOTE — Discharge Summary (Signed)
Physician Discharge Summary  Rachel Sellers:[id number] DOB: Jun 01, 1991 DOA: 12/05/2015  PCP: No primary care provider on file.  Admit date: 12/05/2015 Discharge date: 12/07/2015  Time spent: 65 minutes  Recommendations for Outpatient Follow-up:  1. Follow-up with Dr. Lovell Sheehan next week. On follow-up, wound cultures will need to be followed up upon.   Discharge Diagnoses:  Principal Problem:   Left buttock abscess Active Problems:   Cellulitis and abscess   Cellulitis of left buttock   Cellulitis   Anemia   Leukocytosis   Abscess and cellulitis   Discharge Condition: Stable and improved  Diet recommendation: Regular  Filed Weights   12/05/15 0425 12/05/15 0852  Weight: 74.844 kg (165 lb) 75.297 kg (166 lb)    History of present illness:  Per Dr Kerry Hough Rachel Sellers is a 25 y.o. female with a hx of Bipolar 1 disorder, Anxiety, depression, and OCD, who presented to the ED with complaints of cellulitis and an abscess on her left buttock. Pt was recently in the hospital last month with similar complaints, and underwent I&D. Since returning home she has done fairly well until 2 days before admission, when she began experiencing tenderness and swelling in the same area.  She described the abscess as sensitive and hard. She reported swelling and foul smelling drainage that onset around 3 am the morning of admission. She is experiencing much less pain now that it has started draining. She reported a slight fever the am of admission, but is now feeling better. She was also feeling dizzy yesterday and earlier today, but these symptoms have since resolved.  While in the ED, workup revealed an elevated WBC of 17.3. Lactic acid was 1.32. A CT abd revealed persistent but improved inflammatory stranding with associated gas. There was no frank abscess or drainable fluid collection noted. Admission was requested for IV abx.   Hospital Course:  #1 left recurrent perirectal  abscess/cellulitis Patient had presented with recurrent perirectal abscess and cellulitis. Patient was admitted placed empirically on IV clindamycin and IV Azactam. Patient was noted on admission to have a leukocytosis with a white count of 17.3. Blood cultures also obtained as well as wound cultures. CT did not reveal any drainable fluid at that time. General surgery was consulted and patient was seen in consultation by Dr. Lovell Sheehan who followed the patient throughout the hospitalization. Patient's perirectal abscess seemed to drain on its own during the hospitalization. Leukocytosis improved. Patient remained afebrile. Wound cultures were pending at time of discharge. Prior wound cultures from 10/30/2015 during last hospitalization did grew out Escherichia coli and Streptococcus group D high probability for strep bovis which were penicillin sensitive. Patient will be discharged home on 12 more days of oral Augmentin to complete a course of antibiotic treatment. Patient is to follow-up with Dr. Lovell Sheehan of general surgery next week.   #2 anemia Stable. Outpatient follow-up.  #3 leukocytosis Secondary to problem #1. Patient was placed empirically on IV antibiotics of clindamycin and Azactam. Wound cultures were also obtained, however were pending at the time of discharge. Outpatient follow-up.   Procedures:  CT pelvis without contrast 12/05/2015  Consultations:  Gen. surgery: Dr. Lovell Sheehan 12/05/2015  Discharge Exam: Filed Vitals:   12/06/15 2123 12/07/15 0453  BP: 103/50 104/56  Pulse: 72 79  Temp: 98 F (36.7 C) 98.4 F (36.9 C)  Resp: 20 20    General: NAD Cardiovascular: RRR Respiratory: CTAB  Discharge Instructions   Discharge Instructions    Diet general  Complete by:  As directed      Discharge instructions    Complete by:  As directed   Follow up with Dr Lovell SheehanJenkins next week. Eat yogurt daily while on antibiotics.     Increase activity slowly    Complete by:  As directed            Current Discharge Medication List    START taking these medications   Details  amoxicillin-clavulanate (AUGMENTIN) 875-125 MG tablet Take 1 tablet by mouth 2 (two) times daily. Take for 12 days then stop. Qty: 24 tablet, Refills: 0      CONTINUE these medications which have CHANGED   Details  ibuprofen (ADVIL,MOTRIN) 200 MG tablet Take 2-3 tablets (400-600 mg total) by mouth every 6 (six) hours as needed for moderate pain. Qty: 30 tablet, Refills: 0       No Known Allergies Follow-up Information    Follow up with Franky MachoJENKINS,MARK A, MD. Schedule an appointment as soon as possible for a visit in 1 week.   Specialty:  General Surgery   Why:  f/u next week    Contact information:   [address] [place] [id number] [phone]        The results of significant diagnostics from this hospitalization (including imaging, microbiology, ancillary and laboratory) are listed below for reference.    Significant Diagnostic Studies: Ct Pelvis Wo Contrast  12/05/2015  CLINICAL DATA:  Initial evaluation for acute left buttock abscess. EXAM: CT PELVIS WITHOUT CONTRAST TECHNIQUE: Multidetector CT imaging of the pelvis was performed following the standard protocol without intravenous contrast. COMPARISON:  Prior study from 10/29/2015. FINDINGS: Visualized small bowel is of normal caliber without associated inflammatory changes or evidence of obstruction. Visualized colon within normal limits. Appendix visualized in the right lower quadrant and is normal in caliber and appearance without evidence for acute appendicitis. Bladder normal. Uterus and ovaries within normal limits for patient age. No free air.  No adenopathy within the pelvis. Visualized osseous structures within normal limits. No worrisome lytic or blastic osseous lesions. Degenerative disc bulge noted at L5-S1. Again seen is abnormal soft tissue stranding extending from the left perianal region posteriorly into the  medial aspect of the left buttock. Few scattered locules of gas within this region. There is a larger collection of gas within the medial left buttock measuring 14 x 24 mm. Associated soft tissue stranding with induration within the adjacent subcutaneous fat. Overall, these changes are improved relative to prior CT from 10/29/2015. No frank fluid collection now identified on this noncontrast examination. IMPRESSION: Persistent but improved inflammatory stranding with associated gas extending from the left perianal region posteriorly into the subcutaneous fat of the medial left buttock. No frank abscess or drainable fluid collection appreciated on this noncontrast examination. Electronically Signed   By: Rise MuBenjamin  McClintock M.D.   On: 12/05/2015 06:27    Microbiology: Recent Results (from the past 240 hour(s))  Wound culture     Status: None   Collection Time: 12/05/15  4:52 AM  Result Value Ref Range Status   Specimen Description WOUND SITE NOT SPECIFIED  Final   Special Requests NONE  Final   Gram Stain   Final    ABUNDANT WBC PRESENT,BOTH PMN AND MONONUCLEAR NO SQUAMOUS EPITHELIAL CELLS SEEN FEW GRAM POSITIVE COCCI IN PAIRS IN CLUSTERS FEW GRAM NEGATIVE RODS Performed at [facility]    Culture   Final    MULTIPLE ORGANISMS PRESENT, NONE PREDOMINANT Note: NO STAPHYLOCOCCUS AUREUS ISOLATED NO GROUP  A STREP (S.PYOGENES) ISOLATED Performed at [facility]    Report Status 12/07/2015 FINAL  Final  Blood culture (routine x 2)     Status: None (Preliminary result)   Collection Time: 12/05/15  5:10 AM  Result Value Ref Range Status   Specimen Description BLOOD LEFT ANTECUBITAL DRAWN BY RN  Final   Special Requests BOTTLES DRAWN AEROBIC AND ANAEROBIC 10CC EACH  Final   Culture NO GROWTH 2 DAYS  Final   Report Status PENDING  Incomplete     Labs: Basic Metabolic Panel:  Recent Labs Lab 12/05/15 0510 12/06/15 0555 12/07/15 0603  NA 137 139 140  K 3.6 4.2 4.1  CL 104  111 112*  CO2 23 21* 20*  GLUCOSE 111* 97 98  BUN CREATININE 0.81 0.62 0.58  CALCIUM 9.4 8.9 8.7*   Liver Function Tests: No results for input(s): AST, ALT, ALKPHOS, BILITOT, PROT, ALBUMIN in the last 168 hours. No results for input(s): LIPASE, AMYLASE in the last 168 hours. No results for input(s): AMMONIA in the last 168 hours. CBC:  Recent Labs Lab 12/05/15 0510 12/06/15 0555 12/07/15 0603  WBC 17.3* 7.1 7.2  NEUTROABS 14.1*  --   --   HGB 12.5 10.9* 10.9*  HCT 36.6 32.5* 32.0*  MCV 86.7 87.4 86.0  PLT 277 253 276   Cardiac Enzymes: No results for input(s): CKTOTAL, CKMB, CKMBINDEX, TROPONINI in the last 168 hours. BNP: BNP (last 3 results) No results for input(s): BNP in the last 8760 hours.  ProBNP (last 3 results) No results for input(s): PROBNP in the last 8760 hours.  CBG: No results for input(s): GLUCAP in the last 168 hours.     SignedRamiro Harvest MD.  [facility] 12/07/2015, 11:56 AM

## 2015-12-10 LAB — CULTURE, BLOOD (ROUTINE X 2)
Culture: NO GROWTH
Culture: NO GROWTH

## 2016-01-23 ENCOUNTER — Emergency Department (HOSPITAL_COMMUNITY)
Admission: EM | Admit: 2016-01-23 | Discharge: 2016-01-23 | Disposition: A | Discharge status: Home / Self Care | Payer: MEDICAID | Attending: Emergency Medicine | Admitting: Emergency Medicine

## 2016-01-23 DIAGNOSIS — F17200 Nicotine dependence, unspecified, uncomplicated: Secondary | ICD-10-CM | POA: Insufficient documentation

## 2016-01-23 DIAGNOSIS — F319 Bipolar disorder, unspecified: Secondary | ICD-10-CM | POA: Insufficient documentation

## 2016-01-23 DIAGNOSIS — H9202 Otalgia, left ear: Secondary | ICD-10-CM | POA: Insufficient documentation

## 2016-01-23 DIAGNOSIS — R42 Dizziness and giddiness: Secondary | ICD-10-CM | POA: Insufficient documentation

## 2016-01-23 DIAGNOSIS — R51 Headache: Secondary | ICD-10-CM | POA: Insufficient documentation

## 2016-01-23 DIAGNOSIS — Z5321 Procedure and treatment not carried out due to patient leaving prior to being seen by health care provider: Secondary | ICD-10-CM | POA: Insufficient documentation
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
## 2016-01-23 NOTE — ED Notes (Signed)
Unable to locate pt in waiting areas 

## 2016-07-18 IMAGING — CT CT PELVIS W/O CM
2 of 3 series · 15 of 46 positions shown, 17 images · non-contrast
Comparison: Prior study from 10/29/2015.

CLINICAL DATA: Initial evaluation for acute left buttock abscess.

EXAM:
CT PELVIS WITHOUT CONTRAST
TECHNIQUE: Multidetector CT imaging of the pelvis was performed following the
standard protocol without intravenous contrast.

[Series 2: pelvis 5.0 b40f · axial · 0.79mm/px · z∈[+332,+568]mm · 12 of 55 slices shown, 14 images]
[im 4/55  soft-tissue]
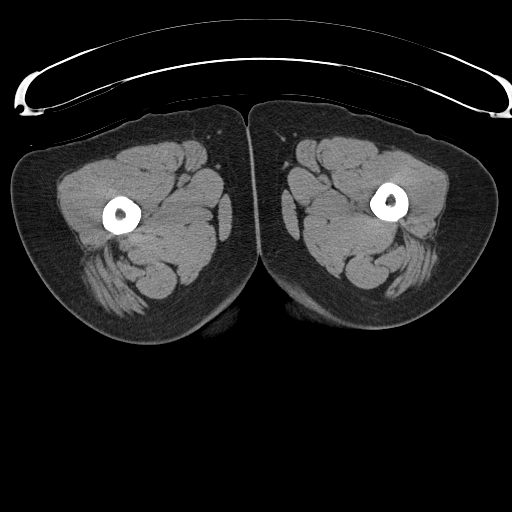
[im 4/55  bone]
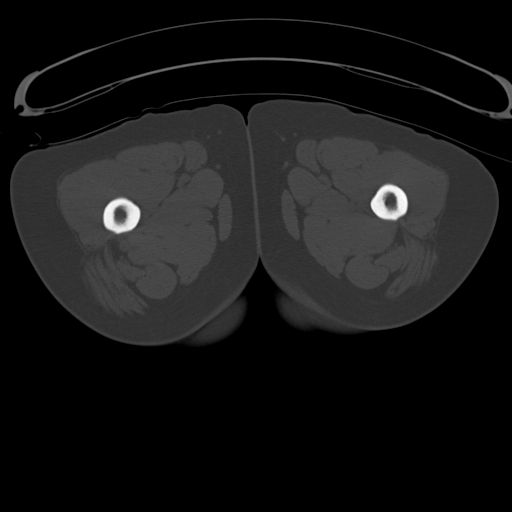
[im 7/55  soft-tissue]
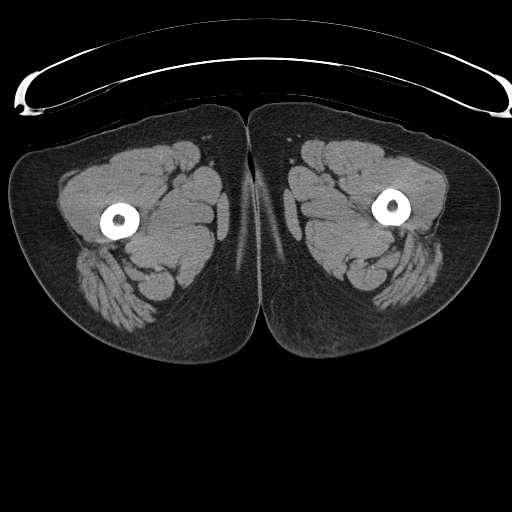
[im 13/55  soft-tissue]
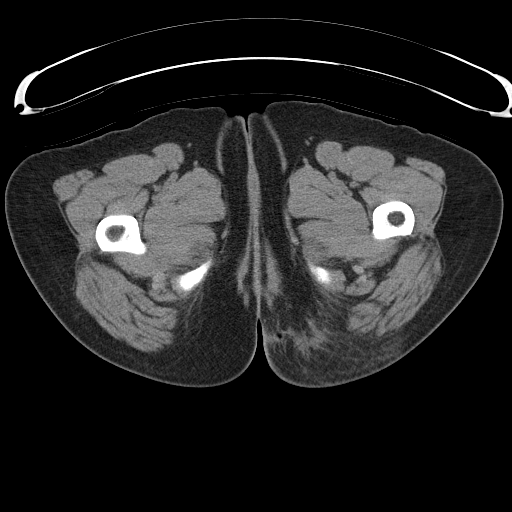
[im 16/55  soft-tissue]
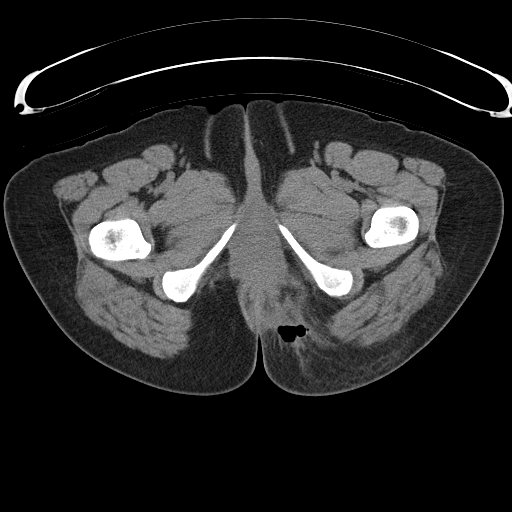
[im 21/55  soft-tissue]
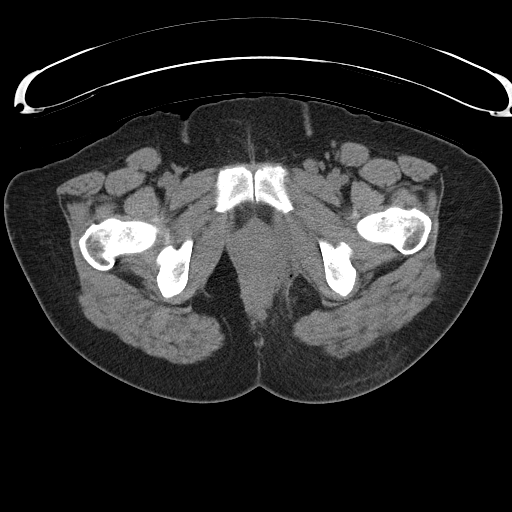
[im 25/55  soft-tissue]
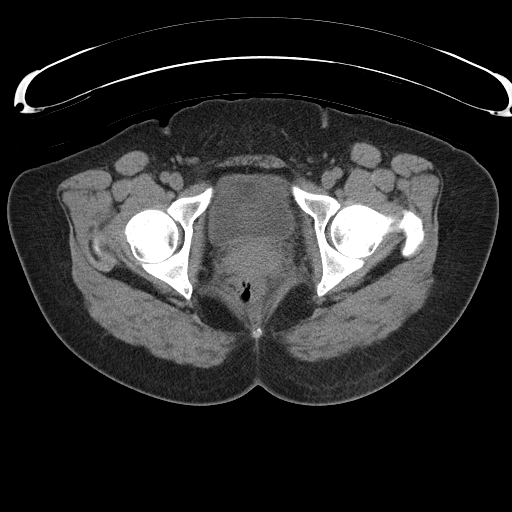
[im 30/55  soft-tissue]
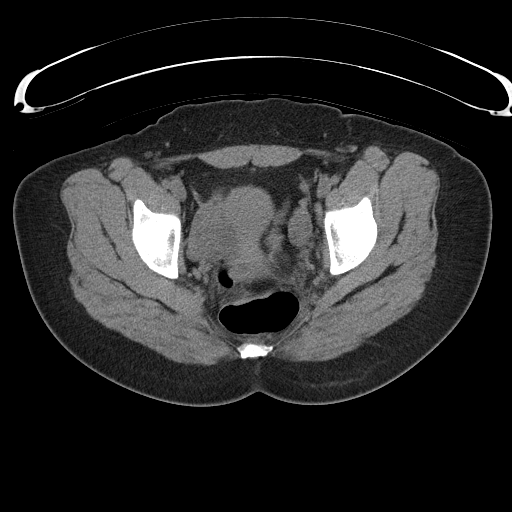
[im 34/55  soft-tissue]
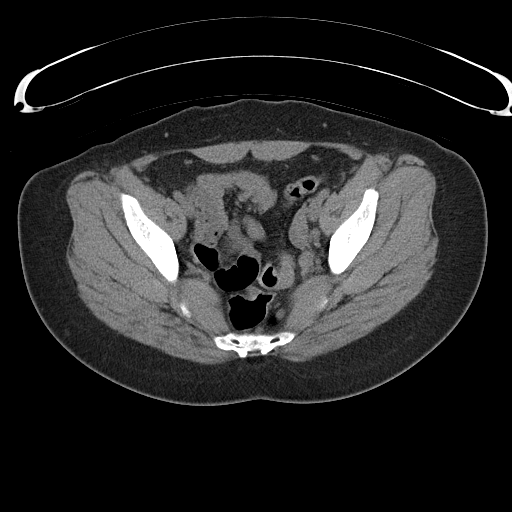
[im 39/55  soft-tissue]
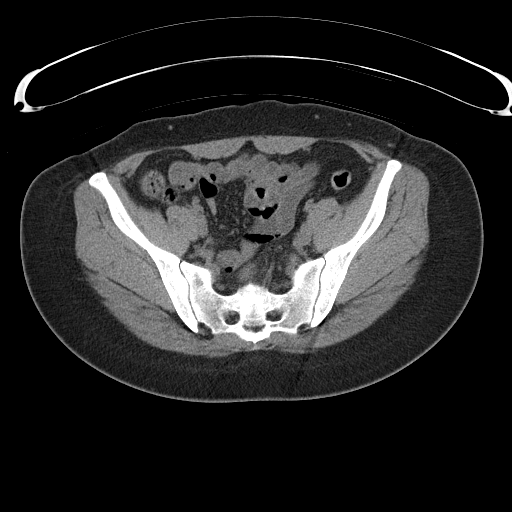
[im 39/55  bone]
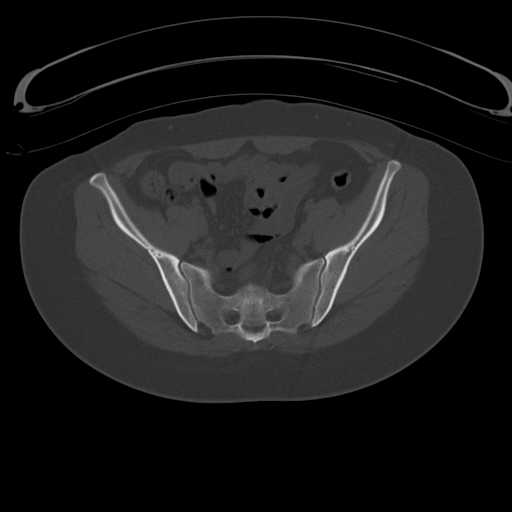
[im 42/55  soft-tissue]
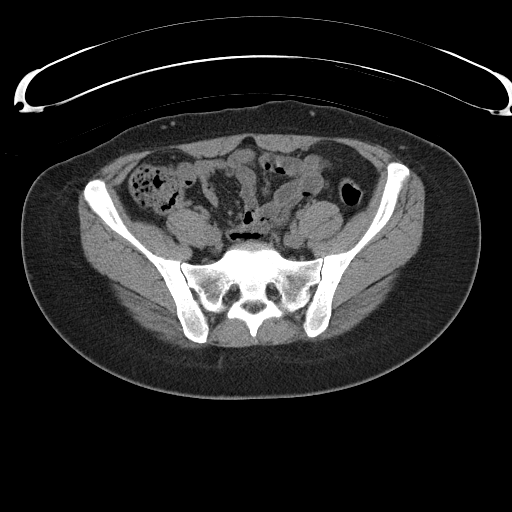
[im 48/55  soft-tissue]
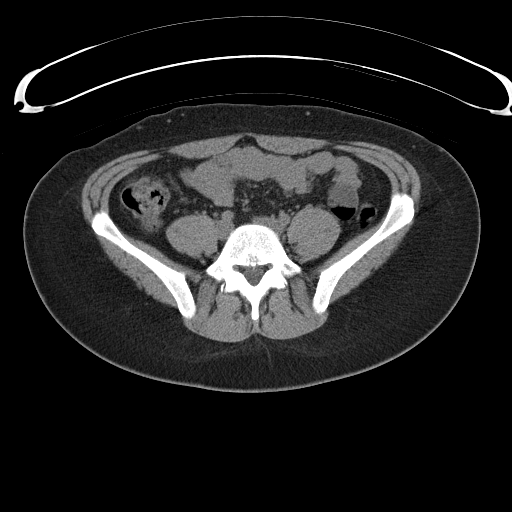
[im 51/55  soft-tissue]
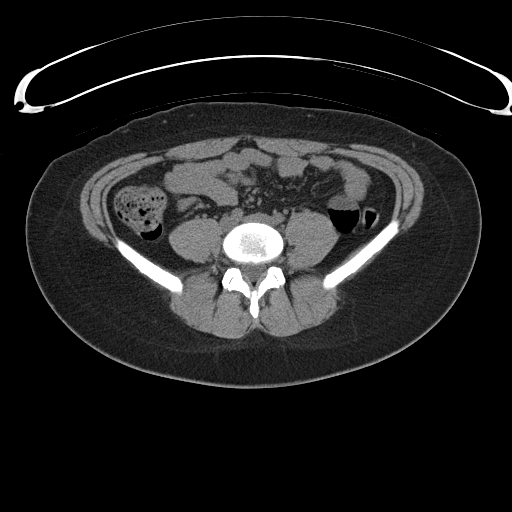

[Series 3: mpr coronal a/p · coronal · 0.56mm/px · 3 of 92 slices shown]
[im 31/92  soft-tissue]
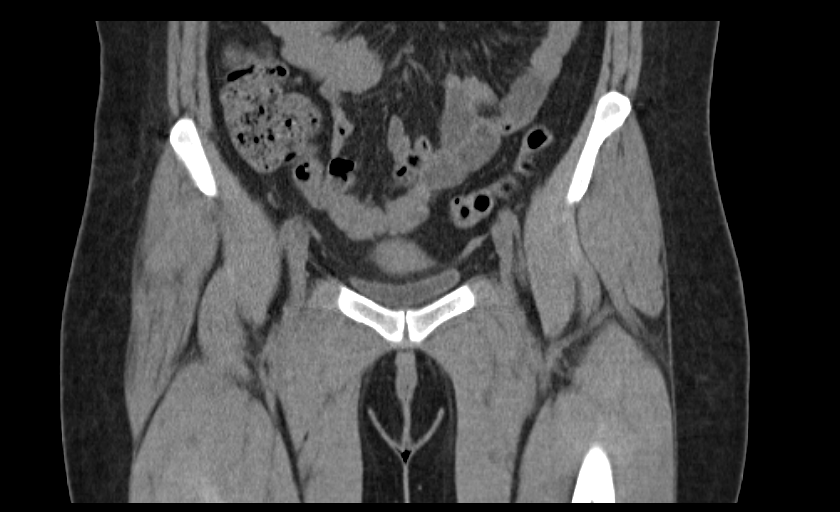
[im 41/92  soft-tissue]
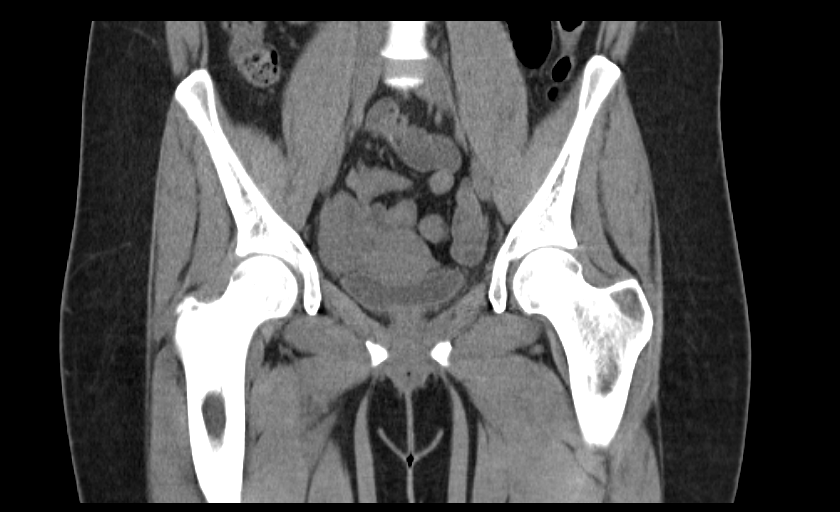
[im 51/92  soft-tissue]
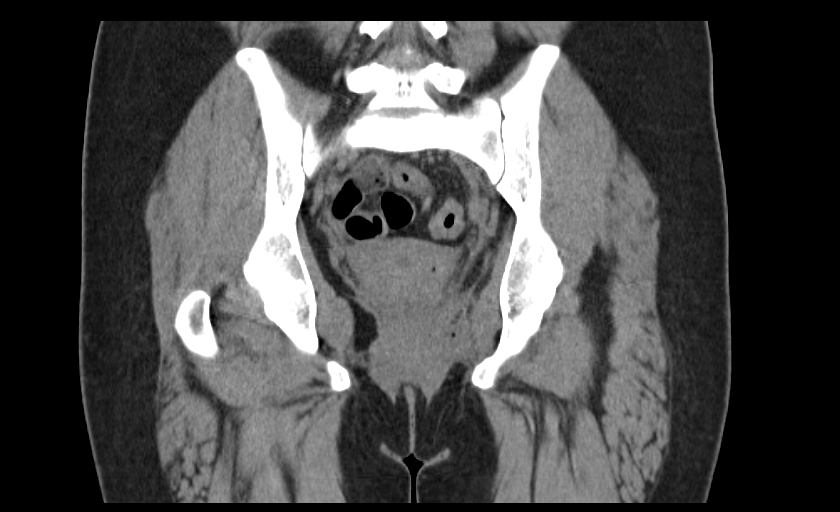

[15 of 46 positions shown; findings below may reference images not displayed]

FINDINGS: Visualized small bowel is of normal caliber without associated
inflammatory changes or evidence of obstruction. Visualized colon
within normal limits. Appendix visualized in the right lower
quadrant and is normal in caliber and appearance without evidence
for acute appendicitis.

Bladder normal. Uterus and ovaries within normal limits for patient
age.

No free air.  No adenopathy within the pelvis.

Visualized osseous structures within normal limits. No worrisome
lytic or blastic osseous lesions. Degenerative disc bulge noted at
L5-S1.

Again seen is abnormal soft tissue stranding extending from the left
perianal region posteriorly into the medial aspect of the left
buttock. Few scattered locules of gas within this region. There is a
larger collection of gas within the medial left buttock measuring 14
x 24 mm. Associated soft tissue stranding with induration within the
adjacent subcutaneous fat. Overall, these changes are improved
relative to prior CT from 10/29/2015. No frank fluid collection now
identified on this noncontrast examination.
IMPRESSION: Persistent but improved inflammatory stranding with associated gas
extending from the left perianal region posteriorly into the
subcutaneous fat of the medial left buttock. No frank abscess or
drainable fluid collection appreciated on this noncontrast
examination.

## 2017-12-08 ENCOUNTER — Emergency Department (HOSPITAL_COMMUNITY)
Admission: EM | Admit: 2017-12-08 | Discharge: 2017-12-08 | Disposition: A | Discharge status: Home / Self Care | Payer: Self-pay | Attending: Emergency Medicine | Admitting: Emergency Medicine

## 2017-12-08 ENCOUNTER — Other Ambulatory Visit: Payer: Self-pay

## 2017-12-08 ENCOUNTER — Encounter (HOSPITAL_COMMUNITY): Payer: Self-pay

## 2017-12-08 DIAGNOSIS — R109 Unspecified abdominal pain: Secondary | ICD-10-CM | POA: Insufficient documentation

## 2017-12-08 DIAGNOSIS — R112 Nausea with vomiting, unspecified: Secondary | ICD-10-CM | POA: Insufficient documentation

## 2017-12-08 DIAGNOSIS — Z209 Contact with and (suspected) exposure to unspecified communicable disease: Secondary | ICD-10-CM | POA: Insufficient documentation

## 2017-12-08 DIAGNOSIS — F17200 Nicotine dependence, unspecified, uncomplicated: Secondary | ICD-10-CM | POA: Insufficient documentation

## 2017-12-08 LAB — COMPREHENSIVE METABOLIC PANEL
ALBUMIN: 4.7 g/dL (ref 3.5–5.0)
ALK PHOS: 65 U/L (ref 38–126)
ALT: 18 U/L (ref 14–54)
ANION GAP: 15 (ref 5–15)
AST: 23 U/L (ref 15–41)
BILIRUBIN TOTAL: 0.7 mg/dL (ref 0.3–1.2)
BUN: 17 mg/dL (ref 6–20)
CALCIUM: 10 mg/dL (ref 8.9–10.3)
CO2: 19 mmol/L — ABNORMAL LOW (ref 22–32)
Chloride: 105 mmol/L (ref 101–111)
Creatinine, Ser: 0.74 mg/dL (ref 0.44–1.00)
GFR calc Af Amer: >60 mL/min (ref >60)
GFR calc non Af Amer: >60 mL/min (ref >60)
Glucose, Bld: 131 mg/dL — ABNORMAL HIGH (ref 65–99)
POTASSIUM: 3.5 mmol/L (ref 3.5–5.1)
Sodium: 139 mmol/L (ref 135–145)
TOTAL PROTEIN: 8.6 g/dL — AB (ref 6.5–8.1)

## 2017-12-08 LAB — CBC WITH DIFFERENTIAL/PLATELET
BASOS PCT: 0 %
Basophils Absolute: 0 10*3/uL (ref 0.0–0.1)
EOS ABS: 0 10*3/uL (ref 0.0–0.7)
Eosinophils Relative: 0 %
HEMATOCRIT: 43.7 % (ref 36.0–46.0)
HEMOGLOBIN: 14.8 g/dL (ref 12.0–15.0)
Lymphocytes Relative: 14 %
Lymphs Abs: 2.4 10*3/uL (ref 0.7–4.0)
MCH: 30.5 pg (ref 26.0–34.0)
MCHC: 33.9 g/dL (ref 30.0–36.0)
MCV: 90.1 fL (ref 78.0–100.0)
Monocytes Absolute: 0.6 10*3/uL (ref 0.1–1.0)
Monocytes Relative: 4 %
NEUTROS ABS: 13.5 10*3/uL — AB (ref 1.7–7.7)
NEUTROS PCT: 82 %
PLATELETS: 344 10*3/uL (ref 150–400)
RBC: 4.85 MIL/uL (ref 3.87–5.11)
RDW: 12.6 % (ref 11.5–15.5)
WBC: 16.4 10*3/uL — ABNORMAL HIGH (ref 4.0–10.5)

## 2017-12-08 LAB — I-STAT BETA HCG BLOOD, ED (MC, WL, AP ONLY): I-stat hCG, quantitative: <5 m[IU]/mL (ref <5)

## 2017-12-08 LAB — LIPASE, BLOOD: LIPASE: 28 U/L (ref 11–51)

## 2017-12-08 MED ORDER — DICYCLOMINE HCL 20 MG PO TABS: 20.0000 mg | ORAL_TABLET | Freq: Three times a day (TID) | ORAL | 0 refills | Status: DC | PRN

## 2017-12-08 MED ORDER — ONDANSETRON HCL 4 MG/2ML IJ SOLN
4.0000 mg | Freq: Once | INTRAMUSCULAR | Status: AC
  Administered 2017-12-08: 4 mg via INTRAVENOUS
  Filled 2017-12-08: qty 2

## 2017-12-08 MED ORDER — METOCLOPRAMIDE HCL 10 MG PO TABS: 10.0000 mg | ORAL_TABLET | Freq: Three times a day (TID) | ORAL | 0 refills | Status: DC | PRN

## 2017-12-08 MED ORDER — METOCLOPRAMIDE HCL 5 MG/ML IJ SOLN
10.0000 mg | Freq: Once | INTRAMUSCULAR | Status: AC
Start: 2017-12-08 — End: 2017-12-08
  Administered 2017-12-08: 10 mg via INTRAVENOUS
  Filled 2017-12-08: qty 2

## 2017-12-08 MED ORDER — DICYCLOMINE HCL 10 MG PO CAPS
10.0000 mg | ORAL_CAPSULE | Freq: Once | ORAL | Status: AC
  Administered 2017-12-08: 10 mg via ORAL
  Filled 2017-12-08: qty 1

## 2017-12-08 MED ORDER — SODIUM CHLORIDE 0.9 % IV BOLUS
1000.0000 mL | Freq: Once | INTRAVENOUS | Status: AC
Start: 2017-12-08 — End: 2017-12-08
  Administered 2017-12-08: 1000 mL via INTRAVENOUS

## 2017-12-08 NOTE — Discharge Instructions (Signed)

## 2017-12-08 NOTE — ED Provider Notes (Signed)
Emergency Department Provider Note   I have reviewed the triage vital signs and the nursing notes.   HISTORY  Chief Complaint Emesis   HPI Rachel Sellers is a 27 y.o. female with PMH of anxiety, bipolar, and OCD's to the emergency department for evaluation of acute onset nausea and vomiting that began this morning with abdominal cramping.  She denies diarrhea.  She just started her menstrual cycle today.  Denies any dysuria, hesitancy, urgency.  She has had some chills but no fevers at home.  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  Denies any chest pain or dyspnea.   The patient is also concerned about an open area on her buttocks.  She had an abscess in that area which was incised and drained 2 years ago.  No packing material was placed according to the patient.  She has not been able to follow-up with general surgery but thinks the area is still open.  She denies any pain to the area currently.  No drainage from this lesion.  Denies any stool from the lesion.    Past Medical History:  Diagnosis Date  . Anxiety   . Bipolar 1 disorder (HCC)   . Depression   . OCD (obsessive compulsive disorder)   . Renal disorder    kidney stones    Patient Active Problem List   Diagnosis Date Noted  . Anemia 12/06/2015  . Leukocytosis 12/06/2015  . Abscess and cellulitis   . Cellulitis 12/05/2015  . Cellulitis and abscess 12/05/2015  . Sepsis (HCC) 10/29/2015  . Left buttock abscess 10/29/2015  . Cellulitis of left buttock     Past Surgical History:  Procedure Laterality Date  . INCISION AND DRAINAGE ABSCESS Left 10/30/2015   Procedure: INCISION AND DRAINAGE PERI-RECTAL ABSCESS;  Surgeon: Franky MachoMark Jenkins, MD;  Location: [facility];  Service: General;  Laterality: Left;    Current Outpatient Rx  . Order #: [id number] Class: Print  . Order #: [id number] Class: Print    Allergies Vancomycin  No family history on file.  Social History Social  History   Tobacco Use  . Smoking status: Current Every Day Smoker    Packs/day: 0.50  . Smokeless tobacco: Never Used  Substance Use Topics  . Alcohol use: Never    Frequency: Never  . Drug use: Yes    Types: Marijuana    Review of Systems  Constitutional: No fever/chills Eyes: No visual changes. ENT: No sore throat. Cardiovascular: Denies chest pain. Respiratory: Denies shortness of breath. Gastrointestinal: Positive cramping abdominal pain. Positive nausea and vomiting.  No diarrhea.  No constipation. Genitourinary: Negative for dysuria. Musculoskeletal: Negative for back pain. Skin: Negative for rash. Open incision on the buttock from remote I&D.  Neurological: Negative for headaches, focal weakness or numbness.  10-point ROS otherwise negative.  ____________________________________________   PHYSICAL EXAM:  VITAL SIGNS: ED Triage Vitals [12/08/17 1511]  Enc Vitals Group     BP 123/89     Pulse Rate 84     Resp 18     Temp (!) 96.8 F (36 C)     Temp Source Temporal     SpO2 98 %     Weight 160 lb (72.6 kg)     Height 5\' 4"  (1.626 m)     Pain Score 0   Constitutional: Alert and oriented. Patient is diaphoretic and having active dry heaving.  Eyes: Conjunctivae are normal. Head: Atraumatic. Nose: No congestion/rhinnorhea. Mouth/Throat: Mucous membranes are dry.  Neck: No stridor.  Cardiovascular: Normal rate, regular rhythm. Good peripheral circulation. Grossly normal heart sounds.   Respiratory: Normal respiratory effort.  No retractions. Lungs CTAB. Gastrointestinal: Soft and nontender. No distention.  Musculoskeletal: No lower extremity tenderness nor edema. No gross deformities of extremities. Neurologic:  Normal speech and language. No gross focal neurologic deficits are appreciated.  Skin:  Skin is warm, dry and intact. No rash noted. Perirectal skin opening (?fistula) without surrounding induration, cellulitis, or active draining.    ____________________________________________   LABS (all labs ordered are listed, but only abnormal results are displayed)  Labs Reviewed  COMPREHENSIVE METABOLIC PANEL - Abnormal; Notable for the following components:      Result Value   CO2 19 (*)    Glucose, Bld 131 (*)    Total Protein 8.6 (*)    All other components within normal limits  CBC WITH DIFFERENTIAL/PLATELET - Abnormal; Notable for the following components:   WBC 16.4 (*)    Neutro Abs 13.5 (*)    All other components within normal limits  LIPASE, BLOOD  I-STAT BETA HCG BLOOD, ED ([facility], [facility], [facility] ONLY)   ____________________________________________  RADIOLOGY  None ____________________________________________   PROCEDURES  Procedure(s) performed:   Procedures  None ____________________________________________   INITIAL IMPRESSION / ASSESSMENT AND PLAN / ED COURSE  Pertinent labs & imaging results that were available during my care of the patient were reviewed by me and considered in my medical decision making (see chart for details).  Patient presents to the emergency department for evaluation of acute onset nausea and vomiting.  Her symptoms seem most consistent with a viral GI infection.  She has a close contact which is sick with similar symptoms.  Abdomen is diffusely soft and nontender to palpation.  Patient also points out an area from incision and drainage 2 years prior.  There is no sign of active infection or drainage in the site. No pelvic/abdominal imaging at this time. Given active symptoms will need to obtain IV access and hydrate along with labs and nausea meds.   05:00 PM Labs reviewed with no acute findings other than leukocytosis to be expected in the setting of acute viral illness. No abdominal/rectal pain to require CT imaging at this time. No UTI symptoms. Patient feeling better after nausea meds and IVF. She is tolerating PO and feeling ready to discharge to home.   At this time, I do  not feel there is any life-threatening condition present. I have reviewed and discussed all results (EKG, imaging, lab, urine as appropriate), exam findings with patient. I have reviewed nursing notes and appropriate previous records.  I feel the patient is safe to be discharged home without further emergent workup. Discussed usual and customary return precautions. Patient and family (if present) verbalize understanding and are comfortable with this plan.  Patient will follow-up with their primary care provider. If they do not have a primary care provider, information for follow-up has been provided to them. All questions have been answered.  ____________________________________________  FINAL CLINICAL IMPRESSION(S) / ED DIAGNOSES  Final diagnoses:  Non-intractable vomiting with nausea, unspecified vomiting type  Abdominal cramping     MEDICATIONS GIVEN DURING THIS VISIT:  Medications  sodium chloride 0.9 % bolus 1,000 mL (0 mLs Intravenous Stopped 12/08/17 1657)  ondansetron (ZOFRAN) injection 4 mg (4 mg Intravenous Given 12/08/17 1541)  dicyclomine (BENTYL) capsule 10 mg (10 mg Oral Given 12/08/17 1553)  metoCLOPramide (REGLAN) injection 10 mg (10 mg Intravenous Given 12/08/17 1627)  NEW OUTPATIENT MEDICATIONS STARTED DURING THIS VISIT:  New Prescriptions   DICYCLOMINE (BENTYL) 20 MG TABLET    Take 1 tablet (20 mg total) by mouth 3 (three) times daily as needed for spasms (and abdominal cramping pain).   METOCLOPRAMIDE (REGLAN) 10 MG TABLET    Take 1 tablet (10 mg total) by mouth every 8 (eight) hours as needed for nausea or vomiting.    Note:  This document was prepared using Dragon voice recognition software and may include unintentional dictation errors.  Alona Bene, MD Emergency Medicine    Long, Arlyss Repress, MD 12/08/17 [phone]

## 2017-12-08 NOTE — ED Triage Notes (Signed)
Pt reports n/v since this morning.  Denies diarrhea.  LBM was today.

## 2019-06-27 ENCOUNTER — Other Ambulatory Visit (HOSPITAL_COMMUNITY)
Admission: RE | Admit: 2019-06-27 | Discharge: 2019-06-27 | Disposition: A | Discharge status: Home / Self Care | Payer: Self-pay | Source: Ambulatory Visit | Attending: Nurse Practitioner | Admitting: Nurse Practitioner

## 2019-06-27 DIAGNOSIS — R87622 Low grade squamous intraepithelial lesion on cytologic smear of vagina (LGSIL): Secondary | ICD-10-CM | POA: Insufficient documentation

## 2019-06-30 LAB — SURGICAL PATHOLOGY

## 2019-07-21 ENCOUNTER — Telehealth: Payer: Self-pay | Admitting: Obstetrics & Gynecology

## 2019-07-21 NOTE — Telephone Encounter (Signed)
Tried to reach the patient to remind her of her appointment/restrictions, call can not be completed at this time. °

## 2019-07-24 ENCOUNTER — Ambulatory Visit (INDEPENDENT_AMBULATORY_CARE_PROVIDER_SITE_OTHER): Payer: Self-pay | Admitting: Obstetrics & Gynecology

## 2019-07-24 ENCOUNTER — Other Ambulatory Visit: Payer: Self-pay

## 2019-07-24 ENCOUNTER — Encounter: Payer: Self-pay | Admitting: Obstetrics & Gynecology

## 2019-07-24 VITALS — BP 121/79 | HR 86 | Ht 63.0 in | Wt 176.0 lb

## 2019-07-24 DIAGNOSIS — R87613 High grade squamous intraepithelial lesion on cytologic smear of cervix (HGSIL): Secondary | ICD-10-CM

## 2019-07-24 NOTE — Progress Notes (Signed)
Preoperative History and Physical  Rachel Sellers is a 28 y.o. G0P0000 with Patient's last menstrual period was 07/12/2019 (exact date). 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    PMH:    Past Medical History:  Diagnosis Date  . Anxiety   . Bipolar 1 disorder ([place])   . Depression   . OCD (obsessive compulsive disorder)   . Renal disorder    kidney stones    PSH:     Past Surgical History:  Procedure Laterality Date  . INCISION AND DRAINAGE ABSCESS Left 10/30/2015   Procedure: INCISION AND DRAINAGE PERI-RECTAL ABSCESS;  Surgeon: Rachel Signs, MD;  Location: [facility];  Service: General;  Laterality: Left;    POb/GynH:      OB History    Gravida  0   Para  0   Term  0   Preterm  0   AB  0   Living  0     SAB  0   TAB  0   Ectopic  0   Multiple  0   Live Births  0           SH:   Social History   Tobacco Use  . Smoking status: Current Every Day Smoker    Packs/day: 0.50  . Smokeless tobacco: Never Used  Substance Use Topics  . Alcohol use: Yes    Frequency: Never    Comment: occassionally  . Drug use: Not Currently    FH:    Family History  Problem Relation Age of Onset  . Depression Mother   . Anxiety disorder Mother   . Depression Sister      Allergies:  Allergies  Allergen Reactions  . Vancomycin Other (See Comments)    Kidney failure    Medications:       Current Outpatient Medications:  .  citalopram (CELEXA) 20 MG tablet, Take by mouth., Disp: , Rfl:  .  dicyclomine (BENTYL) 20 MG tablet, Take 1 tablet (20 mg total) by mouth 3 (three) times daily as needed for spasms (and abdominal cramping pain)., Disp: 20 tablet, Rfl: 0  Review of Systems:   Review of Systems  Constitutional: Negative for fever, chills, weight loss, malaise/fatigue and diaphoresis.  HENT: Negative for hearing loss, ear pain, nosebleeds, congestion, sore throat, neck pain, tinnitus and ear discharge.   Eyes: Negative for blurred  vision, double vision, photophobia, pain, discharge and redness.  Respiratory: Negative for cough, hemoptysis, sputum production, shortness of breath, wheezing and stridor.   Cardiovascular: Negative for chest pain, palpitations, orthopnea, claudication, leg swelling and PND.  Gastrointestinal: Positive for abdominal pain. Negative for heartburn, nausea, vomiting, diarrhea, constipation, blood in stool and melena.  Genitourinary: Negative for dysuria, urgency, frequency, hematuria and flank pain.  Musculoskeletal: Negative for myalgias, back pain, joint pain and falls.  Skin: Negative for itching and rash.  Neurological: Negative for dizziness, tingling, tremors, sensory change, speech change, focal weakness, seizures, loss of consciousness, weakness and headaches.  Endo/Heme/Allergies: Negative for environmental allergies and polydipsia. Does not bruise/bleed easily.  Psychiatric/Behavioral: Negative for depression, suicidal ideas, hallucinations, memory loss and substance abuse. The patient is not nervous/anxious and does not have insomnia.      PHYSICAL EXAM:  Blood pressure 121/79, pulse 86, height 5\' 3"  (1.6 m), weight 176 lb (79.8 kg), last menstrual period 07/12/2019.    Vitals reviewed. Constitutional: She is oriented to person, place, and time. She appears well-developed and well-nourished.  HENT:  Head: Normocephalic and atraumatic.  Right Ear: External ear normal.  Left Ear: External ear normal.  Nose: Nose normal.  Mouth/Throat: Oropharynx is clear and moist.  Eyes: Conjunctivae and EOM are normal. Pupils are equal, round, and reactive to light. Right eye exhibits no discharge. Left eye exhibits no discharge. No scleral icterus.  Neck: Normal range of motion. Neck supple. No tracheal deviation present. No thyromegaly present.  Cardiovascular: Normal rate, regular rhythm, normal heart sounds and intact distal pulses.  Exam reveals no gallop and no friction rub.   No murmur  heard. Respiratory: Effort normal and breath sounds normal. No respiratory distress. She has no wheezes. She has no rales. She exhibits no tenderness.  GI: Soft. Bowel sounds are normal. She exhibits no distension and no mass. There is tenderness. There is no rebound and no guarding.  Genitourinary:       Vulva is normal without lesions Vagina is pink moist without discharge Cervix normal in appearance  Uterus is normal size, contour, position, consistency, mobility, non-tender Adnexa is negative with normal sized ovaries by sonogram  Musculoskeletal: Normal range of motion. She exhibits no edema and no tenderness.  Neurological: She is alert and oriented to person, place, and time. She has normal reflexes. She displays normal reflexes. No cranial nerve deficit. She exhibits normal muscle tone. Coordination normal.  Skin: Skin is warm and dry. No rash noted. No erythema. No pallor.  Psychiatric: She has a normal mood and affect. Her behavior is normal. Judgment and thought content normal.    Labs: No results found for this or any previous visit (from the past 336 hour(s)).  EKG: No orders found for this or any previous visit.  Imaging Studies: No results found.    Assessment: HSIL of endocervix    Plan: Laser conization of the cervix, for treatment of HSIL of endocervix 08/23/2019  Rachel Sellers 07/24/2019 9:05 AM     Face to face time:  20 minutes  Greater than 50% of the visit time was spent in counseling and coordination of care with the patient.  The summary and outline of the counseling and care coordination is summarized in the note above.   All questions were answered.

## 2019-08-18 NOTE — Patient Instructions (Addendum)
Rachel Sellers  08/18/2019     @   Your procedure is scheduled on  08/23/2019 .  Report to [facility] at  0700  A.M.  Call this number if you have problems the morning of surgery:  [phone]   Remember:  Do not eat or drink after midnight.                       Take these medicines the morning of surgery with A SIP OF WATER  Celexa.    Do not wear jewelry, make-up or nail polish.  Do not wear lotions, powders, or perfumes. Please wear deodorant and brush your teeth.  Do not shave 48 hours prior to surgery.  Men may shave face and neck.  Do not bring valuables to the hospital.  [facility] is not responsible for any belongings or valuables.  Contacts, dentures or bridgework may not be worn into surgery.  Leave your suitcase in the car.  After surgery it may be brought to your room.  For patients admitted to the hospital, discharge time will be determined by your treatment team.  Patients discharged the day of surgery will not be allowed to drive home.   Name and phone number of your driver:   family Special instructions:  None  Please read over the following fact sheets that you were given. Anesthesia Post-op Instructions and Care and Recovery After Surgery       Cervical Conization, Care After This sheet gives you information about how to care for yourself after your procedure. Your doctor may also give you more specific instructions. If you have problems or questions, contact your doctor. Follow these instructions at home: Medicines   Take over-the-counter and prescription medicines only as told by your doctor.  Do not take aspirin until your doctor says it is okay.  If you take pain medicine: ? You may have constipation. To help treat this, your doctor may tell you to:  Drink enough fluid to keep your pee (urine) clear or pale yellow.  Take medicines.  Eat foods that are high in fiber. These include fresh fruits and  vegetables, whole grains, bran, and beans.  Limit foods that are high in fat and sugar. These include fried foods and sweet foods. ? Do not drive or use heavy machines. General instructions  You can eat your usual diet unless your doctor tells you not to do so.  Take showers for the first week. Do not take baths, swim, or use hot tubs until your doctor says it is okay.  Do not douche, use tampons, or have sex until your doctor says it is okay.  For 7-14 days after your procedure, avoid: ? Being very active. ? Exercising. ? Heavy lifting.  Keep all follow-up visits as told by your doctor. This is important. Contact a doctor if:  You have a rash.  You are dizzy or lightheaded.  You feel sick to your stomach (nauseous).  You throw up (vomit).  You have fluid from your vagina (vaginal discharge) that smells bad. Get help right away if:  There are blood clots coming from your vagina.  You have more bleeding than you would have in a normal period. For example, you soak a pad in less than 1 hour.  You have a fever.  You have more and more cramps.  You pass out (faint).  You have pain when peeing.  Your  have a lot of pain.  Your pain gets worse.  Your pain does not get better when you take your medicine.  You have blood in your pee.  You throw up (vomit). Summary  After your procedure, take over-the-counter and prescription medicines only as told by your doctor.  Do not douche, use tampons, or have sex until your doctor says it is okay.  For about 7-14 days after your procedure, try not to exercise or lift heavy objects.  Get help right away if you have new symptoms, or if your symptoms become worse. This information is not intended to replace advice given to you by your health care provider. Make sure you discuss any questions you have with your health care provider. Document Released: 06/09/2008 Document Revised: 08/13/2017 Document Reviewed:  09/02/2016 Elsevier Patient Education  2020 [place] Anesthesia, Adult, Care After This sheet gives you information about how to care for yourself after your procedure. Your health care provider may also give you more specific instructions. If you have problems or questions, contact your health care provider. What can I expect after the procedure? After the procedure, the following side effects are common:  Pain or discomfort at the IV site.  Nausea.  Vomiting.  Sore throat.  Trouble concentrating.  Feeling cold or chills.  Weak or tired.  Sleepiness and fatigue.  Soreness and body aches. These side effects can affect parts of the body that were not involved in surgery. Follow these instructions at home:  For at least 24 hours after the procedure:  Have a responsible adult stay with you. It is important to have someone help care for you until you are awake and alert.  Rest as needed.  Do not: ? Participate in activities in which you could fall or become injured. ? Drive. ? Use heavy machinery. ? Drink alcohol. ? Take sleeping pills or medicines that cause drowsiness. ? Make important decisions or sign legal documents. ? Take care of children on your own. Eating and drinking  Follow any instructions from your health care provider about eating or drinking restrictions.  When you feel hungry, start by eating small amounts of foods that are soft and easy to digest (bland), such as toast. Gradually return to your regular diet.  Drink enough fluid to keep your urine pale yellow.  If you vomit, rehydrate by drinking water, juice, or clear broth. General instructions  If you have sleep apnea, surgery and certain medicines can increase your risk for breathing problems. Follow instructions from your health care provider about wearing your sleep device: ? Anytime you are sleeping, including during daytime naps. ? While taking prescription pain medicines,  sleeping medicines, or medicines that make you drowsy.  Return to your normal activities as told by your health care provider. Ask your health care provider what activities are safe for you.  Take over-the-counter and prescription medicines only as told by your health care provider.  If you smoke, do not smoke without supervision.  Keep all follow-up visits as told by your health care provider. This is important. Contact a health care provider if:  You have nausea or vomiting that does not get better with medicine.  You cannot eat or drink without vomiting.  You have pain that does not get better with medicine.  You are unable to pass urine.  You develop a skin rash.  You have a fever.  You have redness around your IV site that gets worse. Get help right away  if:  You have difficulty breathing.  You have chest pain.  You have blood in your urine or stool, or you vomit blood. Summary  After the procedure, it is common to have a sore throat or nausea. It is also common to feel tired.  Have a responsible adult stay with you for the first 24 hours after general anesthesia. It is important to have someone help care for you until you are awake and alert.  When you feel hungry, start by eating small amounts of foods that are soft and easy to digest (bland), such as toast. Gradually return to your regular diet.  Drink enough fluid to keep your urine pale yellow.  Return to your normal activities as told by your health care provider. Ask your health care provider what activities are safe for you. This information is not intended to replace advice given to you by your health care provider. Make sure you discuss any questions you have with your health care provider. Document Released: 12/07/2000 Document Revised: 09/03/2017 Document Reviewed: 04/16/2017 Elsevier Patient Education  2020 [employer]. How to Use Chlorhexidine for Bathing Chlorhexidine gluconate (CHG) is a  germ-killing (antiseptic) solution that is used to clean the skin. It can get rid of the bacteria that normally live on the skin and can keep them away for about 24 hours. To clean your skin with CHG, you may be given:  A CHG solution to use in the shower or as part of a sponge bath.  A prepackaged cloth that contains CHG. Cleaning your skin with CHG may help lower the risk for infection:  While you are staying in the intensive care unit of the hospital.  If you have a vascular access, such as a central line, to provide short-term or long-term access to your veins.  If you have a catheter to drain urine from your bladder.  If you are on a ventilator. A ventilator is a machine that helps you breathe by moving air in and out of your lungs.  After surgery. What are the risks? Risks of using CHG include:  A skin reaction.  Hearing loss, if CHG gets in your ears.  Eye injury, if CHG gets in your eyes and is not rinsed out.  The CHG product catching fire. Make sure that you avoid smoking and flames after applying CHG to your skin. Do not use CHG:  If you have a chlorhexidine allergy or have previously reacted to chlorhexidine.  On babies younger than 53 months of age. How to use CHG solution  Use CHG only as told by your health care provider, and follow the instructions on the label.  Use the full amount of CHG as directed. Usually, this is one bottle. During a shower Follow these steps when using CHG solution during a shower (unless your health care provider gives you different instructions): 1. Start the shower. 2. Use your normal soap and shampoo to wash your face and hair. 3. Turn off the shower or move out of the shower stream. 4. Pour the CHG onto a clean washcloth. Do not use any type of brush or rough-edged sponge. 5. Starting at your neck, lather your body down to your toes. Make sure you follow these instructions: ? If you will be having surgery, pay special attention  to the part of your body where you will be having surgery. Scrub this area for at least 1 minute. ? Do not use CHG on your head or face. If the solution gets  into your ears or eyes, rinse them well with water. ? Avoid your genital area. ? Avoid any areas of skin that have broken skin, cuts, or scrapes. ? Scrub your back and under your arms. Make sure to wash skin folds. 6. Let the lather sit on your skin for 1-2 minutes or as long as told by your health care provider. 7. Thoroughly rinse your entire body in the shower. Make sure that all body creases and crevices are rinsed well. 8. Dry off with a clean towel. Do not put any substances on your body afterward--such as powder, lotion, or perfume--unless you are told to do so by your health care provider. Only use lotions that are recommended by the manufacturer. 9. Put on clean clothes or pajamas. 10. If it is the night before your surgery, sleep in clean sheets.  During a sponge bath Follow these steps when using CHG solution during a sponge bath (unless your health care provider gives you different instructions): 1. Use your normal soap and shampoo to wash your face and hair. 2. Pour the CHG onto a clean washcloth. 3. Starting at your neck, lather your body down to your toes. Make sure you follow these instructions: ? If you will be having surgery, pay special attention to the part of your body where you will be having surgery. Scrub this area for at least 1 minute. ? Do not use CHG on your head or face. If the solution gets into your ears or eyes, rinse them well with water. ? Avoid your genital area. ? Avoid any areas of skin that have broken skin, cuts, or scrapes. ? Scrub your back and under your arms. Make sure to wash skin folds. 4. Let the lather sit on your skin for 1-2 minutes or as long as told by your health care provider. 5. Using a different clean, wet washcloth, thoroughly rinse your entire body. Make sure that all body creases  and crevices are rinsed well. 6. Dry off with a clean towel. Do not put any substances on your body afterward--such as powder, lotion, or perfume--unless you are told to do so by your health care provider. Only use lotions that are recommended by the manufacturer. 7. Put on clean clothes or pajamas. 8. If it is the night before your surgery, sleep in clean sheets. How to use CHG prepackaged cloths  Only use CHG cloths as told by your health care provider, and follow the instructions on the label.  Use the CHG cloth on clean, dry skin.  Do not use the CHG cloth on your head or face unless your health care provider tells you to.  When washing with the CHG cloth: ? Avoid your genital area. ? Avoid any areas of skin that have broken skin, cuts, or scrapes. Before surgery Follow these steps when using a CHG cloth to clean before surgery (unless your health care provider gives you different instructions): 1. Using the CHG cloth, vigorously scrub the part of your body where you will be having surgery. Scrub using a back-and-forth motion for 3 minutes. The area on your body should be completely wet with CHG when you are done scrubbing. 2. Do not rinse. Discard the cloth and let the area air-dry. Do not put any substances on the area afterward, such as powder, lotion, or perfume. 3. Put on clean clothes or pajamas. 4. If it is the night before your surgery, sleep in clean sheets.  For general bathing Follow these steps when using  CHG cloths for general bathing (unless your health care provider gives you different instructions). 1. Use a separate CHG cloth for each area of your body. Make sure you wash between any folds of skin and between your fingers and toes. Wash your body in the following order, switching to a new cloth after each step: ? The front of your neck, shoulders, and chest. ? Both of your arms, under your arms, and your hands. ? Your stomach and groin area, avoiding the  genitals. ? Your right leg and foot. ? Your left leg and foot. ? The back of your neck, your back, and your buttocks. 2. Do not rinse. Discard the cloth and let the area air-dry. Do not put any substances on your body afterward--such as powder, lotion, or perfume--unless you are told to do so by your health care provider. Only use lotions that are recommended by the manufacturer. 3. Put on clean clothes or pajamas. Contact a health care provider if:  Your skin gets irritated after scrubbing.  You have questions about using your solution or cloth. Get help right away if:  Your eyes become very red or swollen.  Your eyes itch badly.  Your skin itches badly and is red or swollen.  Your hearing changes.  You have trouble seeing.  You have swelling or tingling in your mouth or throat.  You have trouble breathing.  You swallow any chlorhexidine. Summary  Chlorhexidine gluconate (CHG) is a germ-killing (antiseptic) solution that is used to clean the skin. Cleaning your skin with CHG may help to lower your risk for infection.  You may be given CHG to use for bathing. It may be in a bottle or in a prepackaged cloth to use on your skin. Carefully follow your health care provider's instructions and the instructions on the product label.  Do not use CHG if you have a chlorhexidine allergy.  Contact your health care provider if your skin gets irritated after scrubbing. This information is not intended to replace advice given to you by your health care provider. Make sure you discuss any questions you have with your health care provider. Document Released: 05/25/2012 Document Revised: 11/17/2018 Document Reviewed: 07/29/2017 Elsevier Patient Education  2020 [employer]

## 2019-08-21 ENCOUNTER — Other Ambulatory Visit: Payer: Self-pay

## 2019-08-21 ENCOUNTER — Encounter (HOSPITAL_COMMUNITY)
Admission: RE | Admit: 2019-08-21 | Discharge: 2019-08-21 | Disposition: A | Discharge status: Home / Self Care | Payer: Self-pay | Source: Ambulatory Visit | Attending: Obstetrics & Gynecology | Admitting: Obstetrics & Gynecology

## 2019-08-21 ENCOUNTER — Other Ambulatory Visit (HOSPITAL_COMMUNITY)
Admission: RE | Admit: 2019-08-21 | Discharge: 2019-08-21 | Disposition: A | Discharge status: Home / Self Care | Payer: Self-pay | Source: Ambulatory Visit | Attending: Obstetrics & Gynecology | Admitting: Obstetrics & Gynecology

## 2019-08-21 ENCOUNTER — Encounter (HOSPITAL_COMMUNITY): Payer: Self-pay

## 2019-08-21 ENCOUNTER — Other Ambulatory Visit: Payer: Self-pay | Admitting: Obstetrics & Gynecology

## 2019-08-21 DIAGNOSIS — Z01812 Encounter for preprocedural laboratory examination: Secondary | ICD-10-CM | POA: Insufficient documentation

## 2019-08-21 DIAGNOSIS — Z20828 Contact with and (suspected) exposure to other viral communicable diseases: Secondary | ICD-10-CM | POA: Insufficient documentation

## 2019-08-21 LAB — CBC
HCT: 40 % (ref 36.0–46.0)
Hemoglobin: 13.4 g/dL (ref 12.0–15.0)
MCH: 32.1 pg (ref 26.0–34.0)
MCHC: 33.5 g/dL (ref 30.0–36.0)
MCV: 95.7 fL (ref 80.0–100.0)
Platelets: 277 10*3/uL (ref 150–400)
RBC: 4.18 MIL/uL (ref 3.87–5.11)
RDW: 12.9 % (ref 11.5–15.5)
WBC: 10.2 10*3/uL (ref 4.0–10.5)
nRBC: 0 % (ref 0.0–0.2)

## 2019-08-21 LAB — COMPREHENSIVE METABOLIC PANEL
ALT: 23 U/L (ref 0–44)
AST: 20 U/L (ref 15–41)
Albumin: 3.8 g/dL (ref 3.5–5.0)
Alkaline Phosphatase: 45 U/L (ref 38–126)
Anion gap: 9 (ref 5–15)
BUN: 10 mg/dL (ref 6–20)
CO2: 22 mmol/L (ref 22–32)
Calcium: 9.1 mg/dL (ref 8.9–10.3)
Chloride: 106 mmol/L (ref 98–111)
Creatinine, Ser: 0.63 mg/dL (ref 0.44–1.00)
GFR calc Af Amer: >60 mL/min (ref >60)
GFR calc non Af Amer: >60 mL/min (ref >60)
Glucose, Bld: 93 mg/dL (ref 70–99)
Potassium: 3.7 mmol/L (ref 3.5–5.1)
Sodium: 137 mmol/L (ref 135–145)
Total Bilirubin: 0.7 mg/dL (ref 0.3–1.2)
Total Protein: 6.9 g/dL (ref 6.5–8.1)

## 2019-08-21 LAB — URINALYSIS, ROUTINE W REFLEX MICROSCOPIC
Bilirubin Urine: NEGATIVE
Glucose, UA: NEGATIVE mg/dL
Hgb urine dipstick: NEGATIVE
Ketones, ur: NEGATIVE mg/dL
Leukocytes,Ua: NEGATIVE
Nitrite: NEGATIVE
Protein, ur: NEGATIVE mg/dL
Specific Gravity, Urine: 1.011 (ref 1.005–1.030)
pH: 7 (ref 5.0–8.0)

## 2019-08-21 LAB — SARS CORONAVIRUS 2 (TAT 6-24 HRS): SARS Coronavirus 2: NEGATIVE

## 2019-08-21 LAB — TYPE AND SCREEN
ABO/RH(D): O POS
Antibody Screen: NEGATIVE

## 2019-08-21 LAB — RAPID HIV SCREEN (HIV 1/2 AB+AG)
HIV 1/2 Antibodies: NONREACTIVE
HIV-1 P24 Antigen - HIV24: NONREACTIVE

## 2019-08-21 LAB — HCG, QUANTITATIVE, PREGNANCY: hCG, Beta Chain, Quant, S: <1 m[IU]/mL (ref <5)

## 2019-08-23 ENCOUNTER — Ambulatory Visit (HOSPITAL_COMMUNITY)
Admission: RE | Admit: 2019-08-23 | Discharge: 2019-08-23 | Disposition: A | Discharge status: Home / Self Care | Payer: Self-pay | Attending: Obstetrics & Gynecology | Admitting: Obstetrics & Gynecology

## 2019-08-23 ENCOUNTER — Other Ambulatory Visit: Payer: Self-pay

## 2019-08-23 ENCOUNTER — Ambulatory Visit (HOSPITAL_COMMUNITY): Payer: Self-pay | Admitting: Anesthesiology

## 2019-08-23 ENCOUNTER — Encounter (HOSPITAL_COMMUNITY)
Admission: RE | Disposition: A | Discharge status: Home / Self Care | Payer: Self-pay | Source: Home / Self Care | Attending: Obstetrics & Gynecology

## 2019-08-23 ENCOUNTER — Encounter (HOSPITAL_COMMUNITY): Payer: Self-pay | Admitting: Anesthesiology

## 2019-08-23 DIAGNOSIS — Z791 Long term (current) use of non-steroidal anti-inflammatories (NSAID): Secondary | ICD-10-CM | POA: Insufficient documentation

## 2019-08-23 DIAGNOSIS — R87613 High grade squamous intraepithelial lesion on cytologic smear of cervix (HGSIL): Secondary | ICD-10-CM | POA: Insufficient documentation

## 2019-08-23 DIAGNOSIS — D649 Anemia, unspecified: Secondary | ICD-10-CM | POA: Insufficient documentation

## 2019-08-23 DIAGNOSIS — Z87442 Personal history of urinary calculi: Secondary | ICD-10-CM | POA: Insufficient documentation

## 2019-08-23 DIAGNOSIS — F429 Obsessive-compulsive disorder, unspecified: Secondary | ICD-10-CM | POA: Insufficient documentation

## 2019-08-23 DIAGNOSIS — Z87891 Personal history of nicotine dependence: Secondary | ICD-10-CM | POA: Insufficient documentation

## 2019-08-23 DIAGNOSIS — F319 Bipolar disorder, unspecified: Secondary | ICD-10-CM | POA: Insufficient documentation

## 2019-08-23 DIAGNOSIS — F419 Anxiety disorder, unspecified: Secondary | ICD-10-CM | POA: Insufficient documentation

## 2019-08-23 DIAGNOSIS — Z881 Allergy status to other antibiotic agents status: Secondary | ICD-10-CM | POA: Insufficient documentation

## 2019-08-23 DIAGNOSIS — Z79899 Other long term (current) drug therapy: Secondary | ICD-10-CM | POA: Insufficient documentation

## 2019-08-23 HISTORY — PX: CERVICAL CONIZATION W/BX: SHX1330

## 2019-08-23 SURGERY — CONE BIOPSY, CERVIX
Anesthesia: General | Laterality: Left

## 2019-08-23 MED ORDER — KETOROLAC TROMETHAMINE 30 MG/ML IJ SOLN
INTRAMUSCULAR | Status: AC
  Filled 2019-08-23: qty 1

## 2019-08-23 MED ORDER — KETOROLAC TROMETHAMINE 30 MG/ML IJ SOLN
30.0000 mg | Freq: Once | INTRAMUSCULAR | Status: AC
  Administered 2019-08-23: 30 mg via INTRAVENOUS

## 2019-08-23 MED ORDER — CEFAZOLIN SODIUM-DEXTROSE 2-4 GM/100ML-% IV SOLN
2.0000 g | INTRAVENOUS | Status: AC
  Administered 2019-08-23: 2 g via INTRAVENOUS

## 2019-08-23 MED ORDER — ONDANSETRON HCL 4 MG/2ML IJ SOLN
INTRAMUSCULAR | Status: AC
  Filled 2019-08-23: qty 2

## 2019-08-23 MED ORDER — MIDAZOLAM HCL 2 MG/2ML IJ SOLN
INTRAMUSCULAR | Status: AC
  Filled 2019-08-23: qty 2

## 2019-08-23 MED ORDER — ONDANSETRON 8 MG PO TBDP: 8.0000 mg | ORAL_TABLET | Freq: Three times a day (TID) | ORAL | 0 refills | Status: DC | PRN

## 2019-08-23 MED ORDER — MIDAZOLAM HCL 2 MG/2ML IJ SOLN
2.0000 mg | Freq: Once | INTRAMUSCULAR | Status: AC
  Administered 2019-08-23: 08:00:00 2 mg via INTRAVENOUS

## 2019-08-23 MED ORDER — FERRIC SUBSULFATE 259 MG/GM EX SOLN
CUTANEOUS | Status: AC
  Filled 2019-08-23: qty 8

## 2019-08-23 MED ORDER — MIDAZOLAM HCL 5 MG/5ML IJ SOLN
INTRAMUSCULAR | Status: DC | PRN
  Administered 2019-08-23: 2 mg via INTRAVENOUS

## 2019-08-23 MED ORDER — FENTANYL CITRATE (PF) 250 MCG/5ML IJ SOLN
INTRAMUSCULAR | Status: AC
  Filled 2019-08-23: qty 5

## 2019-08-23 MED ORDER — MEPERIDINE HCL 50 MG/ML IJ SOLN: 6.2500 mg | INTRAMUSCULAR | Status: DC | PRN

## 2019-08-23 MED ORDER — LACTATED RINGERS IV SOLN
Freq: Once | INTRAVENOUS | Status: AC
  Administered 2019-08-23: 08:00:00 via INTRAVENOUS

## 2019-08-23 MED ORDER — WATER FOR IRRIGATION, STERILE IR SOLN
Status: DC | PRN
  Administered 2019-08-23: 1000 mL

## 2019-08-23 MED ORDER — FERRIC SUBSULFATE SOLN
Status: DC | PRN
  Administered 2019-08-23: 1

## 2019-08-23 MED ORDER — LIDOCAINE HCL (CARDIAC) PF 50 MG/5ML IV SOSY
PREFILLED_SYRINGE | INTRAVENOUS | Status: DC | PRN
  Administered 2019-08-23: 60 mg via INTRAVENOUS

## 2019-08-23 MED ORDER — HYDROCODONE-ACETAMINOPHEN 5-325 MG PO TABS: 1.0000 | ORAL_TABLET | Freq: Four times a day (QID) | ORAL | 0 refills | Status: DC | PRN

## 2019-08-23 MED ORDER — PROPOFOL 10 MG/ML IV BOLUS
INTRAVENOUS | Status: DC | PRN
  Administered 2019-08-23: 300 mg via INTRAVENOUS

## 2019-08-23 MED ORDER — ONDANSETRON HCL 4 MG/2ML IJ SOLN
INTRAMUSCULAR | Status: DC | PRN
  Administered 2019-08-23: 4 mg via INTRAVENOUS

## 2019-08-23 MED ORDER — LIDOCAINE 2% (20 MG/ML) 5 ML SYRINGE
INTRAMUSCULAR | Status: AC
  Filled 2019-08-23: qty 5

## 2019-08-23 MED ORDER — LACTATED RINGERS IV SOLN
INTRAVENOUS | Status: DC | PRN
  Administered 2019-08-23: 10:00:00 via INTRAVENOUS

## 2019-08-23 MED ORDER — CEFAZOLIN SODIUM-DEXTROSE 2-4 GM/100ML-% IV SOLN
INTRAVENOUS | Status: AC
  Filled 2019-08-23: qty 100

## 2019-08-23 MED ORDER — HYDROMORPHONE HCL 1 MG/ML IJ SOLN: 0.2500 mg | INTRAMUSCULAR | Status: DC | PRN

## 2019-08-23 MED ORDER — FENTANYL CITRATE (PF) 100 MCG/2ML IJ SOLN
INTRAMUSCULAR | Status: DC | PRN
  Administered 2019-08-23 (×2): 50 ug via INTRAVENOUS
  Administered 2019-08-23 (×2): 25 ug via INTRAVENOUS
  Administered 2019-08-23 (×2): 50 ug via INTRAVENOUS

## 2019-08-23 MED ORDER — ONDANSETRON HCL 4 MG/2ML IJ SOLN: 4.0000 mg | Freq: Once | INTRAMUSCULAR | Status: DC | PRN

## 2019-08-23 MED ORDER — KETOROLAC TROMETHAMINE 10 MG PO TABS: 10.0000 mg | ORAL_TABLET | Freq: Three times a day (TID) | ORAL | 0 refills | Status: DC | PRN

## 2019-08-23 MED ORDER — PROPOFOL 10 MG/ML IV BOLUS
INTRAVENOUS | Status: AC
  Filled 2019-08-23: qty 20

## 2019-08-23 MED ORDER — PROPOFOL 10 MG/ML IV BOLUS
INTRAVENOUS | Status: AC
  Filled 2019-08-23: qty 40

## 2019-08-23 SURGICAL SUPPLY — 29 items
APPLICATOR COTTON TIP 6 STRL (MISCELLANEOUS) ×3 IMPLANT
APPLICATOR COTTON TIP 6IN STRL (MISCELLANEOUS) ×9
CLOTH BEACON ORANGE TIMEOUT ST (SAFETY) ×3 IMPLANT
COVER LIGHT HANDLE STERIS (MISCELLANEOUS) ×3 IMPLANT
COVER WAND RF STERILE (DRAPES) ×3 IMPLANT
ELECT REM PT RETURN 9FT ADLT (ELECTROSURGICAL) ×3
ELECTRODE REM PT RTRN 9FT ADLT (ELECTROSURGICAL) ×1 IMPLANT
GLOVE BIOGEL PI IND STRL 6.5 (GLOVE) ×1 IMPLANT
GLOVE BIOGEL PI IND STRL 7.0 (GLOVE) ×1 IMPLANT
GLOVE BIOGEL PI IND STRL 8 (GLOVE) ×1 IMPLANT
GLOVE BIOGEL PI INDICATOR 6.5 (GLOVE) ×2
GLOVE BIOGEL PI INDICATOR 7.0 (GLOVE) ×2
GLOVE BIOGEL PI INDICATOR 8 (GLOVE) ×2
GLOVE ECLIPSE 8.0 STRL XLNG CF (GLOVE) ×3 IMPLANT
GLOVE SURG SS PI 6.5 STRL IVOR (GLOVE) ×3 IMPLANT
GOWN STRL REUS W/TWL LRG LVL3 (GOWN DISPOSABLE) ×3 IMPLANT
GOWN STRL REUS W/TWL XL LVL3 (GOWN DISPOSABLE) ×3 IMPLANT
KIT TURNOVER KIT A (KITS) ×3 IMPLANT
LASER FIBER DISP 1000U (UROLOGICAL SUPPLIES) ×3 IMPLANT
MANIFOLD NEPTUNE II (INSTRUMENTS) ×3 IMPLANT
PACK PERI GYN (CUSTOM PROCEDURE TRAY) ×3 IMPLANT
PAD ARMBOARD 7.5X6 YLW CONV (MISCELLANEOUS) ×3 IMPLANT
PREFILTER SMOKE EVAC (FILTER) ×3 IMPLANT
SCOPETTES 8  STERILE (MISCELLANEOUS) ×2
SCOPETTES 8 STERILE (MISCELLANEOUS) ×1 IMPLANT
SET BASIN LINEN APH (SET/KITS/TRAYS/PACK) ×3 IMPLANT
TOWEL OR 17X26 4PK STRL BLUE (TOWEL DISPOSABLE) ×3 IMPLANT
TUBING SMOKE EVAC CO2 (TUBING) ×3 IMPLANT
WATER STERILE IRR 1000ML POUR (IV SOLUTION) ×3 IMPLANT

## 2019-08-23 NOTE — Transfer of Care (Signed)
Immediate Anesthesia Transfer of Care Note  Patient: Rachel Sellers  Procedure(s) Performed: LASER ABLATION OF CERVIX (Left )  Patient Location: PACU  Anesthesia Type:General  Level of Consciousness: awake, alert  and oriented  Airway & Oxygen Therapy: Patient Spontanous Breathing  Post-op Assessment: Report given to RN  Post vital signs: Reviewed and stable  Last Vitals:  Vitals Value Taken Time  BP 105/60 08/23/19 1018  Temp 36.4 C 08/23/19 1017  Pulse 79 08/23/19 1023  Resp 14 08/23/19 1023  SpO2 97 % 08/23/19 1023  Vitals shown include unvalidated device data.  Last Pain:  Vitals:   08/23/19 0721  TempSrc: Oral  PainSc: 0-No pain         Complications: No apparent anesthesia complications

## 2019-08-23 NOTE — Op Note (Signed)
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  So I did a laser ablation not conization  Description of Note:  Patient was taken to the OR and placed in the supine position where she underwent laryngeal mask airway anaesthesia.  She was placed in the dorsal lithotomy position.  She was draped for laser.  Graves speculum was placed and 3% acetic acid used and the laser microscope employed to perform colposcopy which confirmed the office findings.  Laser was used on typical cervical settings and used to vaporized the squamocolumnar junction to  depth of  5-7 mm peripherally and 7-9 mm centrally.  Surgical margin of several mm was employed beyond the acetowhite epithelium.  Hemostasis was achieved with the laser and Monsel's solution.  Patient was awakened from anaesthesia in good stable condition and all counts were correct.  She received Ancef 2 gram and Toradol 30 mg IV preoperatively prophylactically.  Florian Buff, MD 08/23/2019 10:06 AM

## 2019-08-23 NOTE — Anesthesia Preprocedure Evaluation (Addendum)
Anesthesia Evaluation  Patient identified by MRN, date of birth, ID band Patient awake    Reviewed: Allergy & Precautions, NPO status , Patient's Chart, lab work & pertinent test results  Airway Mallampati: II  TM Distance: >3 FB Neck ROM: Full    Dental  (+) Loose, Dental Advisory Given,    Pulmonary Current Smoker and Patient abstained from smoking.,    Pulmonary exam normal breath sounds clear to auscultation       Cardiovascular Exercise Tolerance: Good Normal cardiovascular exam Rhythm:Regular Rate:Normal     Neuro/Psych PSYCHIATRIC DISORDERS Anxiety Depression Bipolar Disorder    GI/Hepatic negative GI ROS, Neg liver ROS,   Endo/Other    Renal/GU Renal disease     Musculoskeletal   Abdominal   Peds  Hematology  (+) anemia ,   Anesthesia Other Findings   Reproductive/Obstetrics                          Anesthesia Physical Anesthesia Plan  ASA: II  Anesthesia Plan: General   Post-op Pain Management:    Induction: Intravenous  PONV Risk Score and Plan: Ondansetron, Dexamethasone and Midazolam  Airway Management Planned: LMA  Additional Equipment:   Intra-op Plan:   Post-operative Plan: Extubation in OR  Informed Consent: I have reviewed the patients History and Physical, chart, labs and discussed the procedure including the risks, benefits and alternatives for the proposed anesthesia with the patient or authorized representative who has indicated his/her understanding and acceptance.     Dental advisory given  Plan Discussed with: CRNA  Anesthesia Plan Comments:        Anesthesia Quick Evaluation

## 2019-08-23 NOTE — Discharge Instructions (Signed)
Cervical Laser Surgery, Care After  This sheet gives you information about how to care for yourself after your procedure. Your health care provider may also give you more specific instructions. If you have problems or questions, contact your health care provider. What can I expect after the procedure? After the procedure, it is common to have:  Pain or discomfort.  Mild cramping.  Bleeding, spotting, or brownish discharge from your vagina. Follow these instructions at home: Activity  Return to your normal activities as told by your health care provider. Ask your health care provider what activities are safe for you.  Do not lift anything that is heavier than 10 lb (4.5 kg), or the limit that your health care provider tells you, until he or she says that it is safe.  Do not have sex or put anything in your vagina until your health care provider says it is okay. General instructions  Take over-the-counter and prescription medicines only as told by your health care provider.  Do not drive or use heavy machinery while taking prescription pain medicine.  Wear sanitary pads to protect from bleeding, spotting, and discharge.  Do not use tampons or douche until your health care provider says it is okay.  It is up to you to get the results of your procedure. Ask your health care provider, or the department that is doing the procedure, when your results will be ready.  Keep all follow-up visits as told by your health care provider. This is important. Contact a health care provider if:  Your pain or cramping does not improve.  Your periods are more painful than usual.  You do not get your period as expected. Get help right away if:  You have any symptoms of infection, such as: ? A fever. ? Chills. ? Discharge that smells bad.  You have severe pain in your abdomen.  You have heavy bleeding from your vagina (more than a normal period).  You have vaginal bleeding with clumps of  blood (blood clots). Summary  After this procedure, it is common to have pain or discomfort and mild cramping. It is also common to have bleeding, spotting, or brownish discharge from your vagina.  You may need to wear sanitary pads to protect from bleeding, spotting, and discharge.  Do not have sex, use tampons, or douche until your health care provider says it is okay.  Return to your normal activities as told by your health care provider. Ask your health care provider what activities are safe for you.  Take over-the-counter and prescription medicines only as told by your health care provider. These include medicines for pain. This information is not intended to replace advice given to you by your health care provider. Make sure you discuss any questions you have with your health care provider. Document Released: 07/20/2016 Document Revised: 07/20/2016 Document Reviewed: 07/20/2016 Elsevier Patient Education  2020 [place] Anesthesia, Adult, Care After This sheet gives you information about how to care for yourself after your procedure. Your health care provider may also give you more specific instructions. If you have problems or questions, contact your health care provider. What can I expect after the procedure? After the procedure, the following side effects are common:  Pain or discomfort at the IV site.  Nausea.  Vomiting.  Sore throat.  Trouble concentrating.  Feeling cold or chills.  Weak or tired.  Sleepiness and fatigue.  Soreness and body aches. These side effects can affect parts of the  body that were not involved in surgery. Follow these instructions at home:  For at least 24 hours after the procedure:  Have a responsible adult stay with you. It is important to have someone help care for you until you are awake and alert.  Rest as needed.  Do not: ? Participate in activities in which you could fall or become injured. ? Drive. ? Use heavy  machinery. ? Drink alcohol. ? Take sleeping pills or medicines that cause drowsiness. ? Make important decisions or sign legal documents. ? Take care of children on your own. Eating and drinking  Follow any instructions from your health care provider about eating or drinking restrictions.  When you feel hungry, start by eating small amounts of foods that are soft and easy to digest (bland), such as toast. Gradually return to your regular diet.  Drink enough fluid to keep your urine pale yellow.  If you vomit, rehydrate by drinking water, juice, or clear broth. General instructions  If you have sleep apnea, surgery and certain medicines can increase your risk for breathing problems. Follow instructions from your health care provider about wearing your sleep device: ? Anytime you are sleeping, including during daytime naps. ? While taking prescription pain medicines, sleeping medicines, or medicines that make you drowsy.  Return to your normal activities as told by your health care provider. Ask your health care provider what activities are safe for you.  Take over-the-counter and prescription medicines only as told by your health care provider.  If you smoke, do not smoke without supervision.  Keep all follow-up visits as told by your health care provider. This is important. Contact a health care provider if:  You have nausea or vomiting that does not get better with medicine.  You cannot eat or drink without vomiting.  You have pain that does not get better with medicine.  You are unable to pass urine.  You develop a skin rash.  You have a fever.  You have redness around your IV site that gets worse. Get help right away if:  You have difficulty breathing.  You have chest pain.  You have blood in your urine or stool, or you vomit blood. Summary  After the procedure, it is common to have a sore throat or nausea. It is also common to feel tired.  Have a responsible  adult stay with you for the first 24 hours after general anesthesia. It is important to have someone help care for you until you are awake and alert.  When you feel hungry, start by eating small amounts of foods that are soft and easy to digest (bland), such as toast. Gradually return to your regular diet.  Drink enough fluid to keep your urine pale yellow.  Return to your normal activities as told by your health care provider. Ask your health care provider what activities are safe for you. This information is not intended to replace advice given to you by your health care provider. Make sure you discuss any questions you have with your health care provider. Document Released: 12/07/2000 Document Revised: 09/03/2017 Document Reviewed: 04/16/2017 Elsevier Patient Education  2020 [employer].

## 2019-08-23 NOTE — Anesthesia Procedure Notes (Signed)
Procedure Name: LMA Insertion Date/Time: 08/23/2019 9:32 AM Performed by: Ollen Bowl, CRNA Pre-anesthesia Checklist: Patient identified, Patient being monitored, Emergency Drugs available, Timeout performed and Suction available Patient Re-evaluated:Patient Re-evaluated prior to induction Oxygen Delivery Method: Circle System Utilized Preoxygenation: Pre-oxygenation with 100% oxygen Induction Type: IV induction Ventilation: Mask ventilation without difficulty LMA: LMA inserted LMA Size: 3.0 Number of attempts: 1 Placement Confirmation: positive ETCO2 and breath sounds checked- equal and bilateral

## 2019-08-23 NOTE — Anesthesia Postprocedure Evaluation (Signed)
Anesthesia Post Note  Patient: Rachel Sellers  Procedure(s) Performed: LASER ABLATION OF CERVIX (Left )  Patient location during evaluation: PACU Anesthesia Type: General Level of consciousness: awake and alert and oriented Pain management: pain level controlled Vital Signs Assessment: post-procedure vital signs reviewed and stable Respiratory status: spontaneous breathing Cardiovascular status: blood pressure returned to baseline Postop Assessment: no apparent nausea or vomiting Anesthetic complications: no     Last Vitals:  Vitals:   08/23/19 1049 08/23/19 1055  BP: 101/79 127/71  Pulse: 73 67  Resp: 17 17  Temp:  (!) 36.4 C  SpO2: 96% 95%    Last Pain:  Vitals:   08/23/19 1055  TempSrc: Oral  PainSc: 0-No pain                 Meilech Virts

## 2019-08-23 NOTE — H&P (Signed)
Preoperative History and Physical  Rachel Sellers is a 28 y.o. G0P0000 with Patient's last menstrual period was 06/22/2019. 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    PMH:    Past Medical History:  Diagnosis Date  . Anxiety   . Bipolar 1 disorder ([place])   . Depression   . OCD (obsessive compulsive disorder)   . Renal disorder    kidney stones    PSH:     Past Surgical History:  Procedure Laterality Date  . INCISION AND DRAINAGE ABSCESS Left 10/30/2015   Procedure: INCISION AND DRAINAGE PERI-RECTAL ABSCESS;  Surgeon: Aviva Signs, MD;  Location: [facility];  Service: General;  Laterality: Left;    POb/GynH:      OB History    Gravida  0   Para  0   Term  0   Preterm  0   AB  0   Living  0     SAB  0   TAB  0   Ectopic  0   Multiple  0   Live Births  0           SH:   Social History   Tobacco Use  . Smoking status: Current Every Day Smoker    Packs/day: 1.00    Years: 13.00    Pack years: 13.00  . Smokeless tobacco: Never Used  Substance Use Topics  . Alcohol use: Yes    Frequency: Never    Comment: occassionally  . Drug use: Not Currently    FH:    Family History  Problem Relation Age of Onset  . Depression Mother   . Anxiety disorder Mother   . Depression Sister      Allergies:  Allergies  Allergen Reactions  . Gentamicin Other (See Comments)    Renal issues.  . Vancomycin Other (See Comments)    Kidney failure    Medications:       Current Facility-Administered Medications:  .  ceFAZolin (ANCEF) IVPB 2g/100 mL premix, 2 g, Intravenous, On Call to OR, Florian Buff, MD .  ketorolac (TORADOL) 30 MG/ML injection 30 mg, 30 mg, Intravenous, Once, , Mertie Clause, MD .  water for irrigation, sterile for irrigation SOLN, , , PRN, Florian Buff, MD, 1,000 mL at 08/23/19 0901  Review of Systems:   Review of Systems  Constitutional: Negative for fever, chills,  weight loss, malaise/fatigue and diaphoresis.  HENT: Negative for hearing loss, ear pain, nosebleeds, congestion, sore throat, neck pain, tinnitus and ear discharge.   Eyes: Negative for blurred vision, double vision, photophobia, pain, discharge and redness.  Respiratory: Negative for cough, hemoptysis, sputum production, shortness of breath, wheezing and stridor.   Cardiovascular: Negative for chest pain, palpitations, orthopnea, claudication, leg swelling and PND.  Gastrointestinal: Positive for abdominal pain. Negative for heartburn, nausea, vomiting, diarrhea, constipation, blood in stool and melena.  Genitourinary: Negative for dysuria, urgency, frequency, hematuria and flank pain.  Musculoskeletal: Negative for myalgias, back pain, joint pain and falls.  Skin: Negative for itching and rash.  Neurological: Negative for dizziness, tingling, tremors, sensory change, speech change, focal weakness, seizures, loss of consciousness, weakness and headaches.  Endo/Heme/Allergies: Negative for environmental allergies and polydipsia. Does not bruise/bleed easily.  Psychiatric/Behavioral: Negative for depression, suicidal ideas, hallucinations, memory loss and substance abuse. The patient is not nervous/anxious and does not have insomnia.      PHYSICAL EXAM:  Blood pressure 117/68, pulse  80, temperature 98.2 F (36.8 C), temperature source Oral, resp. rate 17, height 5\' 3"  (1.6 m), weight 78.9 kg, last menstrual period 06/22/2019, SpO2 96 %.    Vitals reviewed. Constitutional: She is oriented to person, place, and time. She appears well-developed and well-nourished.  HENT:  Head: Normocephalic and atraumatic.  Right Ear: External ear normal.  Left Ear: External ear normal.  Nose: Nose normal.  Mouth/Throat: Oropharynx is clear and moist.  Eyes: Conjunctivae and EOM are normal. Pupils are equal, round, and reactive to light. Right eye exhibits no discharge. Left eye exhibits no discharge. No  scleral icterus.  Neck: Normal range of motion. Neck supple. No tracheal deviation present. No thyromegaly present.  Cardiovascular: Normal rate, regular rhythm, normal heart sounds and intact distal pulses.  Exam reveals no gallop and no friction rub.   No murmur heard. Respiratory: Effort normal and breath sounds normal. No respiratory distress. She has no wheezes. She has no rales. She exhibits no tenderness.  GI: Soft. Bowel sounds are normal. She exhibits no distension and no mass. There is tenderness. There is no rebound and no guarding.  Genitourinary:       Vulva is normal without lesions Vagina is pink moist without discharge Cervix normal in appearance  Uterus is normal size, contour, position, consistency, mobility, non-tender Adnexa is negative with normal sized ovaries by sonogram  Musculoskeletal: Normal range of motion. She exhibits no edema and no tenderness.  Neurological: She is alert and oriented to person, place, and time. She has normal reflexes. She displays normal reflexes. No cranial nerve deficit. She exhibits normal muscle tone. Coordination normal.  Skin: Skin is warm and dry. No rash noted. No erythema. No pallor.  Psychiatric: She has a normal mood and affect. Her behavior is normal. Judgment and thought content normal.    Labs: Results for orders placed or performed during the hospital encounter of 08/21/19 (from the past 336 hour(s))  Urinalysis, Routine w reflex microscopic   Collection Time: 08/21/19 10:36 AM  Result Value Ref Range   Color, Urine YELLOW YELLOW   APPearance HAZY (A) CLEAR   Specific Gravity, Urine 1.011 1.005 - 1.030   pH 7.0 5.0 - 8.0   Glucose, UA NEGATIVE NEGATIVE mg/dL   Hgb urine dipstick NEGATIVE NEGATIVE   Bilirubin Urine NEGATIVE NEGATIVE   Ketones, ur NEGATIVE NEGATIVE mg/dL   Protein, ur NEGATIVE NEGATIVE mg/dL   Nitrite NEGATIVE NEGATIVE   Leukocytes,Ua NEGATIVE NEGATIVE  CBC   Collection Time: 08/21/19 11:11 AM  Result  Value Ref Range   WBC 10.2 4.0 - 10.5 K/uL   RBC 4.18 3.87 - 5.11 MIL/uL   Hemoglobin 13.4 12.0 - 15.0 g/dL   HCT 14/07/20 93.8 - 10.1 %   MCV 95.7 80.0 - 100.0 fL   MCH 32.1 26.0 - 34.0 pg   MCHC 33.5 30.0 - 36.0 g/dL   RDW 75.1 02.5 - 85.2 %   Platelets 277 150 - 400 K/uL   nRBC 0.0 0.0 - 0.2 %  Comprehensive metabolic panel   Collection Time: 08/21/19 11:11 AM  Result Value Ref Range   Sodium 137 135 - 145 mmol/L   Potassium 3.7 3.5 - 5.1 mmol/L   Chloride 106 98 - 111 mmol/L   CO2 22 22 - 32 mmol/L   Glucose, Bld 93 70 - 99 mg/dL   BUN 10 6 - 20 mg/dL   Creatinine, Ser 14/07/20 0.44 - 1.00 mg/dL   Calcium 9.1 8.9 - 2.42 mg/dL  Total Protein 6.9 6.5 - 8.1 g/dL   Albumin 3.8 3.5 - 5.0 g/dL   AST 20 15 - 41 U/L   ALT 23 0 - 44 U/L   Alkaline Phosphatase 45 38 - 126 U/L   Total Bilirubin 0.7 0.3 - 1.2 mg/dL   GFR calc non Af Amer >60 >60 mL/min   GFR calc Af Amer >60 >60 mL/min   Anion gap 9 5 - 15  hCG, quantitative, pregnancy   Collection Time: 08/21/19 11:11 AM  Result Value Ref Range   hCG, Beta Chain, Quant, S <1 <5 mIU/mL  Rapid HIV screen (HIV 1/2 Ab+Ag)   Collection Time: 08/21/19 11:11 AM  Result Value Ref Range   HIV-1 P24 Antigen - HIV24 NON REACTIVE NON REACTIVE   HIV 1/2 Antibodies NON REACTIVE NON REACTIVE   Interpretation (HIV Ag Ab)      A non reactive test result means that HIV 1 or HIV 2 antibodies and HIV 1 p24 antigen were not detected in the specimen.  Type and screen   Collection Time: 08/21/19 11:11 AM  Result Value Ref Range   ABO/RH(D) O POS    Antibody Screen NEG    Sample Expiration 09/04/2019,2359    Extend sample reason      NO TRANSFUSIONS OR PREGNANCY IN THE PAST 3 MONTHS Performed at [facility], [address] [place], [place] [id number]   Results for orders placed or performed during the hospital encounter of 08/21/19 (from the past 336 hour(s))  SARS CORONAVIRUS 2 (TAT 6-24 HRS) Nasopharyngeal Nasopharyngeal Swab   Collection Time:  08/21/19  7:24 AM   Specimen: Nasopharyngeal Swab  Result Value Ref Range   SARS Coronavirus 2 NEGATIVE NEGATIVE    EKG: No orders found for this or any previous visit.  Imaging Studies: No results found.    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

## 2019-08-23 NOTE — Progress Notes (Signed)
To Whom It May Concern:  Rachel Sellers is to be out of work Dec.9 and Dec.10 due to surgery. She can return to work on Dec.14, 2020 per Dr. Elonda Husky.

## 2019-08-31 ENCOUNTER — Telehealth: Payer: Self-pay | Admitting: Obstetrics & Gynecology

## 2019-08-31 NOTE — Telephone Encounter (Signed)
Unable to reach pt with restrictions.  

## 2019-09-01 ENCOUNTER — Ambulatory Visit (INDEPENDENT_AMBULATORY_CARE_PROVIDER_SITE_OTHER): Payer: Self-pay | Admitting: Obstetrics & Gynecology

## 2019-09-01 ENCOUNTER — Other Ambulatory Visit: Payer: Self-pay

## 2019-09-01 ENCOUNTER — Encounter: Payer: Self-pay | Admitting: Obstetrics & Gynecology

## 2019-09-01 VITALS — BP 124/78 | HR 102 | Ht 63.0 in | Wt 181.0 lb

## 2019-09-01 DIAGNOSIS — Z9889 Other specified postprocedural states: Secondary | ICD-10-CM

## 2019-09-01 NOTE — Progress Notes (Signed)
  HPI: Patient returns for routine postoperative follow-up having undergone laser ablation of the cervix for HSIL on 08/23/2019.  The patient's immediate postoperative recovery has been unremarkable. Since hospital discharge the patient reports no problems.   Current Outpatient Medications: .  citalopram (CELEXA) 20 MG tablet, Take 20 mg by mouth daily. , Disp: , Rfl:  .  HYDROcodone-acetaminophen (NORCO/VICODIN) 5-325 MG tablet, Take 1 tablet by mouth every 6 (six) hours as needed., Disp: 15 tablet, Rfl: 0 .  ondansetron (ZOFRAN ODT) 8 MG disintegrating tablet, Take 1 tablet (8 mg total) by mouth every 8 (eight) hours as needed for nausea or vomiting., Disp: 20 tablet, Rfl: 0 .  traZODone (DESYREL) 100 MG tablet, Take 100 mg by mouth at bedtime., Disp: , Rfl:   No current facility-administered medications for this visit.    Blood pressure 124/78, pulse (!) 102, height 5\' 3"  (1.6 m), weight 181 lb (82.1 kg), last menstrual period 08/28/2019.  Physical Exam: Cervical laser bed is normal post laser in appearance  Diagnostic Tests:   Pathology: HSIL relativly large lesion the ECC was a detached fragment on original colposcopy  Impression: S/p laser abaltion of the cervix for management of HSIL  Plan:   Follow up: Cytology in 6 months for Pap testing    Florian Buff, MD

## 2022-05-22 HISTORY — PX: MULTIPLE TOOTH EXTRACTIONS: SHX2053

## 2022-06-05 ENCOUNTER — Emergency Department (HOSPITAL_COMMUNITY)
Admission: EM | Admit: 2022-06-05 | Discharge: 2022-06-05 | Disposition: A | Discharge status: Home / Self Care | Payer: BC Managed Care – PPO | Source: Home / Self Care | Attending: Emergency Medicine | Admitting: Emergency Medicine

## 2022-06-05 ENCOUNTER — Encounter (HOSPITAL_COMMUNITY): Payer: Self-pay

## 2022-06-05 ENCOUNTER — Emergency Department (HOSPITAL_COMMUNITY): Payer: BC Managed Care – PPO | Admitting: Certified Registered Nurse Anesthetist

## 2022-06-05 ENCOUNTER — Other Ambulatory Visit: Payer: Self-pay

## 2022-06-05 ENCOUNTER — Emergency Department (HOSPITAL_COMMUNITY): Payer: BC Managed Care – PPO

## 2022-06-05 ENCOUNTER — Encounter (HOSPITAL_COMMUNITY)
Admission: EM | Disposition: A | Discharge status: Home / Self Care | Payer: Self-pay | Source: Home / Self Care | Attending: Emergency Medicine

## 2022-06-05 ENCOUNTER — Inpatient Hospital Stay (HOSPITAL_COMMUNITY)
Admission: EM | Admit: 2022-06-05 | Discharge: 2022-06-08 | DRG: 854 | Disposition: A | Discharge status: Home / Self Care | Payer: BC Managed Care – PPO | Attending: General Surgery | Admitting: General Surgery

## 2022-06-05 DIAGNOSIS — K6289 Other specified diseases of anus and rectum: Secondary | ICD-10-CM | POA: Diagnosis present

## 2022-06-05 DIAGNOSIS — F17210 Nicotine dependence, cigarettes, uncomplicated: Secondary | ICD-10-CM | POA: Diagnosis present

## 2022-06-05 DIAGNOSIS — E872 Acidosis, unspecified: Secondary | ICD-10-CM | POA: Diagnosis present

## 2022-06-05 DIAGNOSIS — Z818 Family history of other mental and behavioral disorders: Secondary | ICD-10-CM

## 2022-06-05 DIAGNOSIS — Z881 Allergy status to other antibiotic agents status: Secondary | ICD-10-CM | POA: Diagnosis not present

## 2022-06-05 DIAGNOSIS — F319 Bipolar disorder, unspecified: Secondary | ICD-10-CM | POA: Diagnosis present

## 2022-06-05 DIAGNOSIS — Z87442 Personal history of urinary calculi: Secondary | ICD-10-CM | POA: Diagnosis not present

## 2022-06-05 DIAGNOSIS — F419 Anxiety disorder, unspecified: Secondary | ICD-10-CM | POA: Diagnosis present

## 2022-06-05 DIAGNOSIS — D72829 Elevated white blood cell count, unspecified: Secondary | ICD-10-CM | POA: Diagnosis not present

## 2022-06-05 DIAGNOSIS — K611 Rectal abscess: Secondary | ICD-10-CM | POA: Diagnosis present

## 2022-06-05 DIAGNOSIS — Z79899 Other long term (current) drug therapy: Secondary | ICD-10-CM

## 2022-06-05 DIAGNOSIS — K612 Anorectal abscess: Secondary | ICD-10-CM | POA: Insufficient documentation

## 2022-06-05 DIAGNOSIS — A419 Sepsis, unspecified organism: Principal | ICD-10-CM | POA: Diagnosis present

## 2022-06-05 DIAGNOSIS — D62 Acute posthemorrhagic anemia: Secondary | ICD-10-CM | POA: Diagnosis not present

## 2022-06-05 DIAGNOSIS — R5082 Postprocedural fever: Principal | ICD-10-CM

## 2022-06-05 DIAGNOSIS — E8809 Other disorders of plasma-protein metabolism, not elsewhere classified: Secondary | ICD-10-CM | POA: Diagnosis not present

## 2022-06-05 DIAGNOSIS — F418 Other specified anxiety disorders: Secondary | ICD-10-CM | POA: Insufficient documentation

## 2022-06-05 DIAGNOSIS — Z6833 Body mass index (BMI) 33.0-33.9, adult: Secondary | ICD-10-CM

## 2022-06-05 DIAGNOSIS — E876 Hypokalemia: Secondary | ICD-10-CM | POA: Diagnosis present

## 2022-06-05 DIAGNOSIS — E669 Obesity, unspecified: Secondary | ICD-10-CM | POA: Diagnosis present

## 2022-06-05 DIAGNOSIS — K601 Chronic anal fissure: Secondary | ICD-10-CM

## 2022-06-05 HISTORY — PX: INCISION AND DRAINAGE PERIRECTAL ABSCESS: SHX1804

## 2022-06-05 LAB — CBC WITH DIFFERENTIAL/PLATELET
Abs Immature Granulocytes: 0.12 10*3/uL — ABNORMAL HIGH (ref 0.00–0.07)
Abs Immature Granulocytes: 0.18 10*3/uL — ABNORMAL HIGH (ref 0.00–0.07)
Basophils Absolute: 0.1 10*3/uL (ref 0.0–0.1)
Basophils Absolute: 0 10*3/uL (ref 0.0–0.1)
Basophils Relative: 0 %
Basophils Relative: 0 %
Eosinophils Absolute: 0 10*3/uL (ref 0.0–0.5)
Eosinophils Absolute: 0.3 10*3/uL (ref 0.0–0.5)
Eosinophils Relative: 0 %
Eosinophils Relative: 1 %
HCT: 38.6 % (ref 36.0–46.0)
HCT: 35.4 % — ABNORMAL LOW (ref 36.0–46.0)
Hemoglobin: 13.4 g/dL (ref 12.0–15.0)
Hemoglobin: 12.3 g/dL (ref 12.0–15.0)
Immature Granulocytes: 1 %
Immature Granulocytes: 1 %
Lymphocytes Relative: 12 %
Lymphocytes Relative: 8 %
Lymphs Abs: 2.1 10*3/uL (ref 0.7–4.0)
Lymphs Abs: 1.8 10*3/uL (ref 0.7–4.0)
MCH: 30.2 pg (ref 26.0–34.0)
MCH: 30.2 pg (ref 26.0–34.0)
MCHC: 34.7 g/dL (ref 30.0–36.0)
MCHC: 34.7 g/dL (ref 30.0–36.0)
MCV: 87.1 fL (ref 80.0–100.0)
MCV: 87 fL (ref 80.0–100.0)
Monocytes Absolute: 1.3 10*3/uL — ABNORMAL HIGH (ref 0.1–1.0)
Monocytes Absolute: 1.2 10*3/uL — ABNORMAL HIGH (ref 0.1–1.0)
Monocytes Relative: 7 %
Monocytes Relative: 6 %
Neutro Abs: 13.7 10*3/uL — ABNORMAL HIGH (ref 1.7–7.7)
Neutro Abs: 17.7 10*3/uL — ABNORMAL HIGH (ref 1.7–7.7)
Neutrophils Relative %: 80 %
Neutrophils Relative %: 84 %
Platelets: 331 10*3/uL (ref 150–400)
Platelets: 312 10*3/uL (ref 150–400)
RBC: 4.43 MIL/uL (ref 3.87–5.11)
RBC: 4.07 MIL/uL (ref 3.87–5.11)
RDW: 13.2 % (ref 11.5–15.5)
RDW: 13.3 % (ref 11.5–15.5)
WBC: 17.3 10*3/uL — ABNORMAL HIGH (ref 4.0–10.5)
WBC: 21.2 10*3/uL — ABNORMAL HIGH (ref 4.0–10.5)
nRBC: 0 % (ref 0.0–0.2)
nRBC: 0 % (ref 0.0–0.2)

## 2022-06-05 LAB — COMPREHENSIVE METABOLIC PANEL
ALT: 26 U/L (ref 0–44)
AST: 28 U/L (ref 15–41)
Albumin: 3.7 g/dL (ref 3.5–5.0)
Alkaline Phosphatase: 55 U/L (ref 38–126)
Anion gap: 11 (ref 5–15)
BUN: 10 mg/dL (ref 6–20)
CO2: 21 mmol/L — ABNORMAL LOW (ref 22–32)
Calcium: 8.5 mg/dL — ABNORMAL LOW (ref 8.9–10.3)
Chloride: 103 mmol/L (ref 98–111)
Creatinine, Ser: 0.76 mg/dL (ref 0.44–1.00)
GFR, Estimated: >60 mL/min (ref >60)
Glucose, Bld: 121 mg/dL — ABNORMAL HIGH (ref 70–99)
Potassium: 3.1 mmol/L — ABNORMAL LOW (ref 3.5–5.1)
Sodium: 135 mmol/L (ref 135–145)
Total Bilirubin: 1 mg/dL (ref 0.3–1.2)
Total Protein: 7.3 g/dL (ref 6.5–8.1)

## 2022-06-05 LAB — BASIC METABOLIC PANEL
Anion gap: 10 (ref 5–15)
BUN: 8 mg/dL (ref 6–20)
CO2: 21 mmol/L — ABNORMAL LOW (ref 22–32)
Calcium: 8.9 mg/dL (ref 8.9–10.3)
Chloride: 106 mmol/L (ref 98–111)
Creatinine, Ser: 0.62 mg/dL (ref 0.44–1.00)
GFR, Estimated: >60 mL/min (ref >60)
Glucose, Bld: 113 mg/dL — ABNORMAL HIGH (ref 70–99)
Potassium: 3.4 mmol/L — ABNORMAL LOW (ref 3.5–5.1)
Sodium: 137 mmol/L (ref 135–145)

## 2022-06-05 LAB — I-STAT BETA HCG BLOOD, ED (MC, WL, AP ONLY): I-stat hCG, quantitative: <5 m[IU]/mL (ref <5)

## 2022-06-05 SURGERY — INCISION AND DRAINAGE, ABSCESS, PERIRECTAL
Anesthesia: General | Site: Rectum

## 2022-06-05 MED ORDER — PIPERACILLIN-TAZOBACTAM 3.375 G IVPB 30 MIN
3.3750 g | Freq: Once | INTRAVENOUS | Status: AC
  Administered 2022-06-05: 3.375 g via INTRAVENOUS
  Filled 2022-06-05: qty 50

## 2022-06-05 MED ORDER — FENTANYL CITRATE (PF) 100 MCG/2ML IJ SOLN
INTRAMUSCULAR | Status: DC | PRN
  Administered 2022-06-05: 50 ug via INTRAVENOUS
  Administered 2022-06-05: 25 ug via INTRAVENOUS
  Administered 2022-06-05: 50 ug via INTRAVENOUS
  Administered 2022-06-05: 25 ug via INTRAVENOUS
  Administered 2022-06-05 (×3): 50 ug via INTRAVENOUS

## 2022-06-05 MED ORDER — PROMETHAZINE HCL 25 MG/ML IJ SOLN
INTRAMUSCULAR | Status: AC
  Filled 2022-06-05: qty 1

## 2022-06-05 MED ORDER — FENTANYL CITRATE PF 50 MCG/ML IJ SOSY: 25.0000 ug | PREFILLED_SYRINGE | INTRAMUSCULAR | Status: DC | PRN

## 2022-06-05 MED ORDER — LINEZOLID 600 MG/300ML IV SOLN
600.0000 mg | Freq: Two times a day (BID) | INTRAVENOUS | Status: DC
  Filled 2022-06-05 (×3): qty 300

## 2022-06-05 MED ORDER — PROMETHAZINE HCL 25 MG/ML IJ SOLN
12.5000 mg | Freq: Once | INTRAMUSCULAR | Status: AC
  Administered 2022-06-05: 12.5 mg via INTRAVENOUS

## 2022-06-05 MED ORDER — BUPIVACAINE LIPOSOME 1.3 % IJ SUSP
INTRAMUSCULAR | Status: DC | PRN
  Administered 2022-06-05: 20 mL

## 2022-06-05 MED ORDER — ONDANSETRON HCL 4 MG/2ML IJ SOLN
INTRAMUSCULAR | Status: AC
  Filled 2022-06-05: qty 2

## 2022-06-05 MED ORDER — FENTANYL CITRATE (PF) 100 MCG/2ML IJ SOLN
INTRAMUSCULAR | Status: AC
  Filled 2022-06-05: qty 2

## 2022-06-05 MED ORDER — KETOROLAC TROMETHAMINE 15 MG/ML IJ SOLN
15.0000 mg | Freq: Once | INTRAMUSCULAR | Status: DC
  Filled 2022-06-05: qty 1

## 2022-06-05 MED ORDER — OXYCODONE HCL 5 MG/5ML PO SOLN: 5.0000 mg | Freq: Once | ORAL | Status: DC | PRN

## 2022-06-05 MED ORDER — KETOROLAC TROMETHAMINE 30 MG/ML IJ SOLN
INTRAMUSCULAR | Status: AC
  Filled 2022-06-05: qty 1

## 2022-06-05 MED ORDER — ONDANSETRON HCL 4 MG/2ML IJ SOLN: 4.0000 mg | Freq: Once | INTRAMUSCULAR | Status: DC | PRN

## 2022-06-05 MED ORDER — OXYCODONE HCL 5 MG PO TABS: 5.0000 mg | ORAL_TABLET | Freq: Once | ORAL | Status: DC | PRN

## 2022-06-05 MED ORDER — OXYCODONE HCL 5 MG PO TABS: 5.0000 mg | ORAL_TABLET | ORAL | 0 refills | Status: DC | PRN

## 2022-06-05 MED ORDER — ACETAMINOPHEN 325 MG PO TABS
650.0000 mg | ORAL_TABLET | Freq: Once | ORAL | Status: AC
  Administered 2022-06-05: 650 mg via ORAL
  Filled 2022-06-05: qty 2

## 2022-06-05 MED ORDER — ORAL CARE MOUTH RINSE: 15.0000 mL | Freq: Once | OROMUCOSAL | Status: AC

## 2022-06-05 MED ORDER — IOHEXOL 300 MG/ML  SOLN
100.0000 mL | Freq: Once | INTRAMUSCULAR | Status: AC | PRN
  Administered 2022-06-05: 100 mL via INTRAVENOUS

## 2022-06-05 MED ORDER — AMOXICILLIN-POT CLAVULANATE 875-125 MG PO TABS: 1.0000 | ORAL_TABLET | Freq: Two times a day (BID) | ORAL | 0 refills | Status: AC

## 2022-06-05 MED ORDER — CHLORHEXIDINE GLUCONATE 0.12 % MT SOLN
15.0000 mL | Freq: Once | OROMUCOSAL | Status: AC
  Administered 2022-06-05: 15 mL via OROMUCOSAL
  Filled 2022-06-05: qty 15

## 2022-06-05 MED ORDER — CHLORHEXIDINE GLUCONATE CLOTH 2 % EX PADS: 6.0000 | MEDICATED_PAD | Freq: Once | CUTANEOUS | Status: DC

## 2022-06-05 MED ORDER — MIDAZOLAM HCL 2 MG/2ML IJ SOLN
INTRAMUSCULAR | Status: AC
  Filled 2022-06-05: qty 2

## 2022-06-05 MED ORDER — PROPOFOL 10 MG/ML IV BOLUS
INTRAVENOUS | Status: DC | PRN
  Administered 2022-06-05: 50 mg via INTRAVENOUS
  Administered 2022-06-05: 150 mg via INTRAVENOUS

## 2022-06-05 MED ORDER — LIDOCAINE 2% (20 MG/ML) 5 ML SYRINGE
INTRAMUSCULAR | Status: DC | PRN
  Administered 2022-06-05: 60 mg via INTRAVENOUS

## 2022-06-05 MED ORDER — ONDANSETRON HCL 4 MG/2ML IJ SOLN
INTRAMUSCULAR | Status: DC | PRN
  Administered 2022-06-05: 4 mg via INTRAVENOUS

## 2022-06-05 MED ORDER — SODIUM CHLORIDE 0.9 % IV SOLN
3.0000 g | Freq: Once | INTRAVENOUS | Status: AC
  Administered 2022-06-05: 3 g via INTRAVENOUS
  Filled 2022-06-05: qty 8

## 2022-06-05 MED ORDER — MIDAZOLAM HCL 2 MG/2ML IJ SOLN
INTRAMUSCULAR | Status: DC | PRN
  Administered 2022-06-05: 2 mg via INTRAVENOUS

## 2022-06-05 MED ORDER — METHYLENE BLUE 1 % INJ SOLN
INTRAVENOUS | Status: AC
  Filled 2022-06-05: qty 10

## 2022-06-05 MED ORDER — KETOROLAC TROMETHAMINE 15 MG/ML IJ SOLN
15.0000 mg | Freq: Once | INTRAMUSCULAR | Status: AC
  Administered 2022-06-05: 15 mg via INTRAVENOUS

## 2022-06-05 MED ORDER — BUPIVACAINE LIPOSOME 1.3 % IJ SUSP
INTRAMUSCULAR | Status: AC
  Filled 2022-06-05: qty 20

## 2022-06-05 MED ORDER — KETAMINE HCL 50 MG/5ML IJ SOSY
PREFILLED_SYRINGE | INTRAMUSCULAR | Status: AC
  Filled 2022-06-05: qty 5

## 2022-06-05 MED ORDER — 0.9 % SODIUM CHLORIDE (POUR BTL) OPTIME
TOPICAL | Status: DC | PRN
  Administered 2022-06-05: 1000 mL

## 2022-06-05 MED ORDER — LIDOCAINE HCL (PF) 2 % IJ SOLN
INTRAMUSCULAR | Status: AC
  Filled 2022-06-05: qty 5

## 2022-06-05 MED ORDER — LACTATED RINGERS IV SOLN: INTRAVENOUS | Status: DC

## 2022-06-05 MED ORDER — HYDROGEN PEROXIDE 3 % EX SOLN
CUTANEOUS | Status: DC | PRN
  Administered 2022-06-05: 1

## 2022-06-05 MED ORDER — KETAMINE HCL 10 MG/ML IJ SOLN
INTRAMUSCULAR | Status: DC | PRN
  Administered 2022-06-05: 10 mg via INTRAVENOUS

## 2022-06-05 MED ORDER — SODIUM CHLORIDE 0.9 % IV BOLUS
1000.0000 mL | Freq: Once | INTRAVENOUS | Status: AC
  Administered 2022-06-05: 1000 mL via INTRAVENOUS

## 2022-06-05 MED ORDER — ONDANSETRON HCL 4 MG PO TABS: 4.0000 mg | ORAL_TABLET | Freq: Three times a day (TID) | ORAL | 1 refills | Status: DC | PRN

## 2022-06-05 MED ORDER — SODIUM CHLORIDE 0.9 % IV BOLUS
500.0000 mL | Freq: Once | INTRAVENOUS | Status: AC
  Administered 2022-06-05: 500 mL via INTRAVENOUS

## 2022-06-05 SURGICAL SUPPLY — 27 items
BAG HAMPER (MISCELLANEOUS) ×1 IMPLANT
BNDG GAUZE DERMACEA FLUFF 4 (GAUZE/BANDAGES/DRESSINGS) IMPLANT
BNDG GZE DERMACEA 4 6PLY (GAUZE/BANDAGES/DRESSINGS) ×1
CANNULA VESSEL 3MM 2 BLNT TIP (CANNULA) IMPLANT
CLOTH BEACON ORANGE TIMEOUT ST (SAFETY) ×1 IMPLANT
COVER LIGHT HANDLE STERIS (MISCELLANEOUS) ×2 IMPLANT
DRAIN PENROSE 0.75X12 (DRAIN) IMPLANT
DRAPE HALF SHEET 40X57 (DRAPES) ×1 IMPLANT
ELECT REM PT RETURN 9FT ADLT (ELECTROSURGICAL) ×1
ELECTRODE REM PT RTRN 9FT ADLT (ELECTROSURGICAL) ×1 IMPLANT
GAUZE SPONGE 4X4 12PLY STRL (GAUZE/BANDAGES/DRESSINGS) ×2 IMPLANT
GLOVE BIO SURGEON STRL SZ 6.5 (GLOVE) ×1 IMPLANT
GLOVE BIOGEL PI IND STRL 6.5 (GLOVE) ×1 IMPLANT
GLOVE BIOGEL PI IND STRL 7.0 (GLOVE) ×2 IMPLANT
GOWN STRL REUS W/TWL LRG LVL3 (GOWN DISPOSABLE) ×2 IMPLANT
KIT TURNOVER KIT A (KITS) ×1 IMPLANT
MANIFOLD NEPTUNE II (INSTRUMENTS) ×1 IMPLANT
NEEDLE HYPO 22GX1.5 SAFETY (NEEDLE) ×1 IMPLANT
NS IRRIG 1000ML POUR BTL (IV SOLUTION) ×1 IMPLANT
PACK MINOR (CUSTOM PROCEDURE TRAY) ×1 IMPLANT
PAD ABD 5X9 TENDERSORB (GAUZE/BANDAGES/DRESSINGS) ×1 IMPLANT
PAD ARMBOARD 7.5X6 YLW CONV (MISCELLANEOUS) ×1 IMPLANT
SET BASIN LINEN APH (SET/KITS/TRAYS/PACK) ×1 IMPLANT
SPIKE FLUID TRANSFER (MISCELLANEOUS) ×1 IMPLANT
SUT ETHILON 3 0 FSL (SUTURE) IMPLANT
SYR 20ML LL LF (SYRINGE) IMPLANT
SYR CONTROL 10ML LL (SYRINGE) ×1 IMPLANT

## 2022-06-05 NOTE — Anesthesia Procedure Notes (Addendum)
Procedure Name: LMA Insertion Date/Time: 06/05/2022 2:37 PM  Performed by: Ollen Bowl, CRNAPre-anesthesia Checklist: Patient identified, Patient being monitored, Emergency Drugs available, Timeout performed and Suction available Patient Re-evaluated:Patient Re-evaluated prior to induction Oxygen Delivery Method: Circle System Utilized Preoxygenation: Pre-oxygenation with 100% oxygen Induction Type: IV induction Ventilation: Mask ventilation without difficulty LMA: LMA inserted LMA Size: 4.0 Number of attempts: 1 Placement Confirmation: positive ETCO2 and breath sounds checked- equal and bilateral

## 2022-06-05 NOTE — H&P (Signed)
[facility] History and Physical  Reason for Referral: Perirectal abscess  Referring Physician: ED  Chief Complaint   Tailbone Pain     Rachel Sellers is a 31 y.o. female.  HPI: Rachel Sellers is a sweet 31 yo who comes in with perirectal pain/ pain at her tailbone that has been getting worse over the last few days. She has had some chills but no fevers, and she was evaluated in the ED. She had a WBC and a Ct with a perirectal abscess.  She had a similar problem in 2017 when Dr. Lovell Sheehan drained a posterior rectal abscess that extended into the left buttock. She says that this area was open for a while and she did have to pack the area.   No history of Crohn's or UC.   Past Medical History:  Diagnosis Date   Anxiety    Bipolar 1 disorder (HCC)    Depression    OCD (obsessive compulsive disorder)    Renal disorder    kidney stones    Past Surgical History:  Procedure Laterality Date   CERVICAL CONIZATION W/BX Left 08/23/2019   Procedure: LASER ABLATION OF CERVIX;  Surgeon: Lazaro Arms, MD;  Location: [facility];  Service: Gynecology;  Laterality: Left;   INCISION AND DRAINAGE ABSCESS Left 10/30/2015   Procedure: INCISION AND DRAINAGE PERI-RECTAL ABSCESS;  Surgeon: Franky Macho, MD;  Location: [facility];  Service: General;  Laterality: Left;   MULTIPLE TOOTH EXTRACTIONS  05/22/2022    Family History  Problem Relation Age of Onset   Depression Mother    Anxiety disorder Mother    Depression Sister     Social History   Tobacco Use   Smoking status: Every Day    Packs/day: 1.00    Years: 13.00    Total pack years: 13.00    Types: Cigarettes   Smokeless tobacco: Never  Vaping Use   Vaping Use: Never used  Substance Use Topics   Alcohol use: Not Currently    Comment: occassionally   Drug use: Yes    Types: Marijuana    Comment: occassionally    Medications: I have reviewed the patient's current medications. Current Facility-Administered Medications   Medication Dose Route Frequency Provider Last Rate Last Admin   Chlorhexidine Gluconate Cloth 2 % PADS 6 each  6 each Topical Once Lucretia Roers, MD       And   Chlorhexidine Gluconate Cloth 2 % PADS 6 each  6 each Topical Once Lucretia Roers, MD       lactated ringers infusion   Intravenous Continuous Windell Norfolk, MD 10 mL/hr at 06/05/22 1351 New Bag at 06/05/22 1351   [MAR Hold] linezolid (ZYVOX) IVPB 600 mg  600 mg Intravenous Q12H Lonell Grandchild, MD        Medications Prior to Admission  Medication Sig Dispense Refill   ibuprofen (ADVIL) 200 MG tablet Take 200 mg by mouth every 6 (six) hours as needed for headache or mild pain.      Allergies  Allergen Reactions   Gentamicin Other (See Comments)    Renal issues.   Vancomycin Other (See Comments)    Kidney failure      ROS:  A comprehensive review of systems was negative except for: Gastrointestinal: positive for perianal/ perirectal pain  Blood pressure 95/62, pulse 96, temperature 98.3 F (36.8 C), temperature source Oral, resp. rate (!) 22, height 5\' 3"  (1.6 m), weight 85.3 kg, last menstrual period 06/05/2022,  SpO2 98 %. Physical Exam Vitals reviewed.  Constitutional:      Appearance: Normal appearance.  HENT:     Head: Normocephalic.     Mouth/Throat:     Mouth: Mucous membranes are moist.  Cardiovascular:     Rate and Rhythm: Normal rate.  Pulmonary:     Effort: Pulmonary effort is normal.  Abdominal:     General: There is no distension.     Palpations: Abdomen is soft.     Tenderness: There is no abdominal tenderness.  Genitourinary:    Comments: No external drainage or bulging, no erythema, no skin tags or hemorrhoids noted externally, at tailbone very tender posterior and under tailbone  Musculoskeletal:        General: Normal range of motion.     Cervical back: Normal range of motion.  Skin:    General: Skin is warm.  Neurological:     General: No focal deficit present.     Mental  Status: She is alert and oriented to person, place, and time.  Psychiatric:        Behavior: Behavior normal.     Results: Results for orders placed or performed during the hospital encounter of 06/05/22 (from the past 48 hour(s))  Basic metabolic panel     Status: Abnormal   Collection Time: 06/05/22  9:04 AM  Result Value Ref Range   Sodium 137 135 - 145 mmol/L   Potassium 3.4 (L) 3.5 - 5.1 mmol/L   Chloride 106 98 - 111 mmol/L   CO2 21 (L) 22 - 32 mmol/L   Glucose, Bld 113 (H) 70 - 99 mg/dL    Comment: Glucose reference range applies only to samples taken after fasting for at least 8 hours.   BUN 8 6 - 20 mg/dL   Creatinine, Ser 7.68 0.44 - 1.00 mg/dL   Calcium 8.9 8.9 - 11.5 mg/dL   GFR, Estimated >72 >62 mL/min    Comment: (NOTE) Calculated using the CKD-EPI Creatinine Equation (2021)    Anion gap 10 5 - 15    Comment: Performed at [facility], [address], [place], [place] [address]  CBC with Differential     Status: Abnormal   Collection Time: 06/05/22  9:04 AM  Result Value Ref Range   WBC 17.3 (H) 4.0 - 10.5 K/uL   RBC 4.43 3.87 - 5.11 MIL/uL   Hemoglobin 13.4 12.0 - 15.0 g/dL   HCT 74.1 63.8 - 45.3 %   MCV 87.1 80.0 - 100.0 fL   MCH 30.2 26.0 - 34.0 pg   MCHC 34.7 30.0 - 36.0 g/dL   RDW 64.6 80.3 - 21.2 %   Platelets 331 150 - 400 K/uL   nRBC 0.0 0.0 - 0.2 %   Neutrophils Relative % 80 %   Neutro Abs 13.7 (H) 1.7 - 7.7 K/uL   Lymphocytes Relative 12 %   Lymphs Abs 2.1 0.7 - 4.0 K/uL   Monocytes Relative 7 %   Monocytes Absolute 1.3 (H) 0.1 - 1.0 K/uL   Eosinophils Relative 0 %   Eosinophils Absolute 0.0 0.0 - 0.5 K/uL   Basophils Relative 0 %   Basophils Absolute 0.1 0.0 - 0.1 K/uL   Immature Granulocytes 1 %   Abs Immature Granulocytes 0.12 (H) 0.00 - 0.07 K/uL    Comment: Performed at [facility], [address] [place], [place] [address]  I-Stat Beta hCG blood, ED ([facility], [facility], [facility] only)     Status: None   Collection  Time: 06/05/22  9:21 AM  Result  Value Ref Range   I-stat hCG, quantitative <5.0 <5 mIU/mL   Comment 3            Comment:   GEST. AGE      CONC.  (mIU/mL)   <=1 WEEK        5 - 50     2 WEEKS       50 - 500     3 WEEKS       100 - 10,000     4 WEEKS     1,000 - 30,000        FEMALE AND NON-PREGNANT FEMALE:     LESS THAN 5 mIU/mL    Personally reviewed and shoed patient today's CT and past CT from 2017- posterior/ inferior to tailbone abscess today measuring about 4cm or so, this was smaller than area in 2017 that extended to left buttock and had gas in the collection  CT PELVIS W CONTRAST  Result Date: 06/05/2022 CLINICAL DATA:  Anorectal abscess. Sacral pain since yesterday. History of left buttock abscess. EXAM: CT PELVIS WITH CONTRAST TECHNIQUE: Multidetector CT imaging of the pelvis was performed using the standard protocol following the bolus administration of intravenous contrast. RADIATION DOSE REDUCTION: This exam was performed according to the departmental dose-optimization program which includes automated exposure control, adjustment of the mA and/or kV according to patient size and/or use of iterative reconstruction technique. CONTRAST:  166mL OMNIPAQUE IOHEXOL 300 MG/ML  SOLN COMPARISON:  Pelvic CT 12/05/2015 and 10/29/2015. FINDINGS: Urinary Tract: The visualized distal ureters and bladder appear unremarkable. Bowel: Possible mild anorectal wall thickening without evidence of fistula. No other bowel wall thickening, bowel distention or surrounding inflammation identified in the pelvis. The appendix appears normal. Vascular/Lymphatic: No enlarged pelvic lymph nodes identified. No significant vascular findings. Reproductive: The uterus and ovaries appear unremarkable. No adnexal mass. Other: There is a recurrent peripherally enhancing perianal fluid collection which is located posterolaterally on the left. This measures 3.8 x 2.4 cm on image 38/2 and has a maximal diameter of 5.2 cm on sagittal image 74/5. There is no gas  within this collection. No other focal fluid collections identified, although there is diffuse perirectal soft tissue stranding. Musculoskeletal: No acute or worrisome osseous findings. The perianal fluid collection abuts the coccyx which demonstrates no destruction. The sacroiliac joints appear normal, without sacroiliitis. IMPRESSION: 1. Recurrent posterior perianal peripherally enhancing fluid collection suspicious for a perirectal abscess. Perirectal inflammatory changes with possible mild anorectal wall thickening. No discrete fistula identified. 2. No intrapelvic fluid collections or inflammatory changes. The appendix appears normal. Electronically Signed   By: Richardean Sale M.D.   On: 06/05/2022 10:35     Assessment & Plan:  MOMINA HUNTON is a 31 y.o. female with perirectal abscess/ just posterior and inferior to tailbone. Discussed incision and drainage and possible fistulotomy versus seton given the risk of fistula with her recurrence. Discussed risk of bleeding, infection, needing more procedures if has fistula and seton needed to be placed, risk of incontinence if a fistulotomy is done that cuts to much muscle and need for seton if worried about this to prevent incontinence. Potential need for further surgery or referral to Colorectal. Potential for drain in place and need saline flushes.   -Likely home after surgery    All questions were answered to the satisfaction of the patient.     Virl Cagey 06/05/2022, 2:07 PM

## 2022-06-05 NOTE — Progress Notes (Signed)
[facility]  Discussed surgery with Randall Hiss. 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   He says she was very constipated a few weeks ago after oral surgery and this likely caused that tear.  Discussed that we could get her fissure cream from [place] on Tuesday when I see her to get this drain out.  Augmentin and Roxicodone to the Pharmacy. Keep stools regular and soft.  Curlene Labrum, MD [facility] [address] [place], [place] [address] [phone] (office)

## 2022-06-05 NOTE — Discharge Instructions (Signed)
Post Operative Instructions after Perianal/ Perirectal abscess  -Keep your stools soft and have a BM daily. Take fiber (Metamucil) over the counter daily. Take Colace twice daily if fiber does not keep your stools soft.  Take Miralax as needed if you still have not had a BM in 2 Days. -Take Sitz Baths (warm water baths) 4 times a day and after every BM and when having discomfort.  -Take Ibuprofen and Tylenol alternating and the Narcotic pain medication for breakthrough pain. -Your drainage catheter can cause some discomfort but this should improve.  -Some bleeding is normal, but if you have excessive bleeding, fevers, chills, or more pain than prior, call or go to the ED. -Wear a pad for drainage as needed.

## 2022-06-05 NOTE — H&P (Signed)
History and Physical    Patient: Rachel Sellers BTD:[id number] DOB: 1991/06/01 DOA: 06/05/2022 DOS: the patient was seen and examined on 06/06/2022 PCP: Patient, No Pcp Per  Patient coming from: Home  Chief Complaint:  Chief Complaint  Patient presents with   Fever    Surgery today   HPI: Rachel Sellers is a 31 y.o. female with medical history significant of anxiety, depression, who presents to the emergency department due to perirectal pain s/p incision and drainage of perirectal abscess and chronic posterior fissure yesterday.  Patient presented to the ED yesterday due to worsening perirectal pain that has been going on for a few days.  General surgery (Dr. Henreitta Leber) was consulted and I&D was done and patient was discharged home.  On arrival at home, patient states that she felt some pain around the surgical site when trying to urinate, she wanted to be sure that this was not due to an impending infection, so she returned to the ED for further evaluation and management.  She denies fever, chills, chest pain, shortness of breath, headache, nausea, vomiting.  Patient denies any history of inflammatory bowel disease.   ED Course:  In the emergency department, she was noted to be febrile with a temperature of 100.31F, but other vital signs are within normal range.  Work-up in the ED showed leukocytosis and normocytic anemia BMP was normal except for hypokalemia. CT pelvis with contrast: IMPRESSION: 1. Recurrent posterior perianal peripherally enhancing fluid collection suspicious for a perirectal abscess. Perirectal inflammatory changes with possible mild anorectal wall thickening. No discrete fistula identified. 2. No intrapelvic fluid collections or inflammatory changes. The appendix appears normal. Tylenol was given, Unasyn was provided and IV hydration was given. General surgery was consulted and recommended admitting patient with plan to consult on patient in the morning per ED  physician.  Hospitalist was asked to admit patient for further evaluation and management.  Review of Systems: Review of systems as noted in the HPI. All other systems reviewed and are negative.   Past Medical History:  Diagnosis Date   Anxiety    Bipolar 1 disorder (HCC)    Depression    OCD (obsessive compulsive disorder)    Renal disorder    kidney stones   Past Surgical History:  Procedure Laterality Date   CERVICAL CONIZATION W/BX Left 08/23/2019   Procedure: LASER ABLATION OF CERVIX;  Surgeon: Lazaro Arms, MD;  Location: [facility];  Service: Gynecology;  Laterality: Left;   INCISION AND DRAINAGE ABSCESS Left 10/30/2015   Procedure: INCISION AND DRAINAGE PERI-RECTAL ABSCESS;  Surgeon: Franky Macho, MD;  Location: [facility];  Service: General;  Laterality: Left;   MULTIPLE TOOTH EXTRACTIONS  05/22/2022    Social History:  reports that she has been smoking cigarettes. She has a 13.00 pack-year smoking history. She has never used smokeless tobacco. She reports that she does not currently use alcohol. She reports current drug use. Drug: Marijuana.   Allergies  Allergen Reactions   Gentamicin Other (See Comments)    Renal issues.   Vancomycin Other (See Comments)    Kidney failure    Family History  Problem Relation Age of Onset   Depression Mother    Anxiety disorder Mother    Depression Sister      Prior to Admission medications   Medication Sig Start Date End Date Taking? Authorizing Provider  amoxicillin-clavulanate (AUGMENTIN) 875-125 MG tablet Take 1 tablet by mouth 2 (two) times daily for 5 days. 06/05/22 06/10/22  Henreitta Leber,  Leatrice Jewels, MD  ondansetron (ZOFRAN) 4 MG tablet Take 1 tablet (4 mg total) by mouth every 8 (eight) hours as needed. 06/05/22 06/05/23  Lucretia Roers, MD  oxyCODONE (ROXICODONE) 5 MG immediate release tablet Take 1 tablet (5 mg total) by mouth every 4 (four) hours as needed for breakthrough pain or severe pain. 06/05/22 06/05/23  Lucretia Roers,  MD    Physical Exam: BP 101/66 (BP Location: Left Arm)   Pulse 94   Temp 98.6 F (37 C) (Oral)   Resp 16   Ht 5\' 3"  (1.6 m)   Wt 85.3 kg   LMP 06/05/2022 Comment: currently on menstual period  SpO2 98%   BMI 33.30 kg/m   General: 31 y.o. year-old female well developed well nourished in no acute distress.  Alert and oriented x3. HEENT: NCAT, EOMI Neck: Supple, trachea medial Cardiovascular: Regular rate and rhythm with no rubs or gallops.  No thyromegaly or JVD noted.  No lower extremity edema. 2/4 pulses in all 4 extremities. Respiratory: Clear to auscultation with no wheezes or rales. Good inspiratory effort. Abdomen: Soft, nontender nondistended with normal bowel sounds x4 quadrants. Muskuloskeletal: No cyanosis, clubbing or edema noted bilaterally Neuro: CN II-XII intact, strength 5/5 x 4, sensation, reflexes intact Skin: No ulcerative lesions noted or rashes Psychiatry: Judgement and insight appear normal. Mood is appropriate for condition and setting          Labs on Admission:  Basic Metabolic Panel: Recent Labs  Lab 06/05/22 0904 06/05/22 2300  NA 137 135  K 3.4* 3.1*  CL 106 103  CO2 21* 21*  GLUCOSE 113* 121*  BUN 8 10  CREATININE 0.62 0.76  CALCIUM 8.9 8.5*   Liver Function Tests: Recent Labs  Lab 06/05/22 2300  AST 28  ALT 26  ALKPHOS 55  BILITOT 1.0  PROT 7.3  ALBUMIN 3.7   No results for input(s): "LIPASE", "AMYLASE" in the last 168 hours. No results for input(s): "AMMONIA" in the last 168 hours. CBC: Recent Labs  Lab 06/05/22 0904 06/05/22 2300  WBC 17.3* 21.2*  NEUTROABS 13.7* 17.7*  HGB 13.4 12.3  HCT 38.6 35.4*  MCV 87.1 87.0  PLT 331 312   Cardiac Enzymes: No results for input(s): "CKTOTAL", "CKMB", "CKMBINDEX", "TROPONINI" in the last 168 hours.  BNP (last 3 results) No results for input(s): "BNP" in the last 8760 hours.  ProBNP (last 3 results) No results for input(s): "PROBNP" in the last 8760 hours.  CBG: No results  for input(s): "GLUCAP" in the last 168 hours.  Radiological Exams on Admission: CT PELVIS W CONTRAST  Result Date: 06/05/2022 CLINICAL DATA:  Anorectal abscess. Sacral pain since yesterday. History of left buttock abscess. EXAM: CT PELVIS WITH CONTRAST TECHNIQUE: Multidetector CT imaging of the pelvis was performed using the standard protocol following the bolus administration of intravenous contrast. RADIATION DOSE REDUCTION: This exam was performed according to the departmental dose-optimization program which includes automated exposure control, adjustment of the mA and/or kV according to patient size and/or use of iterative reconstruction technique. CONTRAST:  06/07/2022 OMNIPAQUE IOHEXOL 300 MG/ML  SOLN COMPARISON:  Pelvic CT 12/05/2015 and 10/29/2015. FINDINGS: Urinary Tract: The visualized distal ureters and bladder appear unremarkable. Bowel: Possible mild anorectal wall thickening without evidence of fistula. No other bowel wall thickening, bowel distention or surrounding inflammation identified in the pelvis. The appendix appears normal. Vascular/Lymphatic: No enlarged pelvic lymph nodes identified. No significant vascular findings. Reproductive: The uterus and ovaries appear unremarkable. No adnexal mass.  Other: There is a recurrent peripherally enhancing perianal fluid collection which is located posterolaterally on the left. This measures 3.8 x 2.4 cm on image 38/2 and has a maximal diameter of 5.2 cm on sagittal image 74/5. There is no gas within this collection. No other focal fluid collections identified, although there is diffuse perirectal soft tissue stranding. Musculoskeletal: No acute or worrisome osseous findings. The perianal fluid collection abuts the coccyx which demonstrates no destruction. The sacroiliac joints appear normal, without sacroiliitis. IMPRESSION: 1. Recurrent posterior perianal peripherally enhancing fluid collection suspicious for a perirectal abscess. Perirectal inflammatory  changes with possible mild anorectal wall thickening. No discrete fistula identified. 2. No intrapelvic fluid collections or inflammatory changes. The appendix appears normal. Electronically Signed   By: Richardean Sale M.D.   On: 06/05/2022 10:35    EKG: I independently viewed the EKG done and my findings are as followed: EKG was not done in the ED  Assessment/Plan Present on Admission:  Perirectal abscess  Leukocytosis  Principal Problem:   Perirectal abscess Active Problems:   Leukocytosis   Hypokalemia  Perirectal abscess status post incision and drainage-06/05/2022 Patient was started on IV Unasyn, we shall continue with ciprofloxacin and metronidazole Continue morphine as needed General surgery was consulted and will follow-up with patient in the morning per ED physician  Leukocytosis possibly related to above versus reactive WBC 21.2, continue treatment as described above  Hypokalemia K+ 3.1, this will be replenished   DVT prophylaxis: SCDs  Code Status: Full code  Consults: General surgery  Family Communication: Mother in law at bedside (all questions answered to satisfaction)  Severity of Illness: The appropriate patient status for this patient is INPATIENT. Inpatient status is judged to be reasonable and necessary in order to provide the required intensity of service to ensure the patient's safety. The patient's presenting symptoms, physical exam findings, and initial radiographic and laboratory data in the context of their chronic comorbidities is felt to place them at high risk for further clinical deterioration. Furthermore, it is not anticipated that the patient will be medically stable for discharge from the hospital within 2 midnights of admission.   * I certify that at the point of admission it is my clinical judgment that the patient will require inpatient hospital care spanning beyond 2 midnights from the point of admission due to high intensity of service,  high risk for further deterioration and high frequency of surveillance required.*  Author: Bernadette Hoit, DO 06/06/2022 5:36 AM  For on call review [email or web address].

## 2022-06-05 NOTE — Transfer of Care (Signed)
Immediate Anesthesia Transfer of Care Note  Patient: Rachel Sellers  Procedure(s) Performed: IRRIGATION AND DEBRIDEMENT PERIRECTAL ABSCESS; PENROSE DRAIN PLACED (Rectum)  Patient Location: PACU  Anesthesia Type:General  Level of Consciousness: awake  Airway & Oxygen Therapy: Patient Spontanous Breathing  Post-op Assessment: Report given to RN  Post vital signs: Reviewed and stable  Last Vitals:  Vitals Value Taken Time  BP 119/66 06/05/22 1543  Temp    Pulse 113 06/05/22 1545  Resp 14 06/05/22 1545  SpO2 95 % 06/05/22 1545  Vitals shown include unvalidated device data.  Last Pain:  Vitals:   06/05/22 1326  TempSrc: Oral  PainSc: 1          Complications: No notable events documented.

## 2022-06-05 NOTE — ED Provider Notes (Signed)
[facility] EMERGENCY DEPARTMENT Provider Note  CSN: [id number] Arrival date & time: 06/05/22 0746  Chief Complaint(s) Tailbone Pain  HPI Rachel Sellers is a 31 y.o. female with history of abscess and cellulitis of the left buttock, bipolar disorder presenting to the emergency department for left buttock pain.  She reports pain since yesterday.  Reports feeling mildly lightheaded, no fevers or chills, no nausea or vomiting.  Pain is moderate, worse with sitting.  Having normal bowel movements.  Reports feels similar to when she had a prior abscess and cellulitis of the left buttock.   Past Medical History Past Medical History:  Diagnosis Date   Anxiety    Bipolar 1 disorder (HCC)    Depression    OCD (obsessive compulsive disorder)    Renal disorder    kidney stones   Patient Active Problem List   Diagnosis Date Noted   Anemia 12/06/2015   Leukocytosis 12/06/2015   Abscess and cellulitis    Cellulitis 12/05/2015   Cellulitis and abscess 12/05/2015   Sepsis (HCC) 10/29/2015   Left buttock abscess 10/29/2015   Cellulitis of left buttock    Home Medication(s) Prior to Admission medications   Medication Sig Start Date End Date Taking? Authorizing Provider  citalopram (CELEXA) 20 MG tablet Take 20 mg by mouth daily.  06/20/19   [provider]  HYDROcodone-acetaminophen (NORCO/VICODIN) 5-325 MG tablet Take 1 tablet by mouth every 6 (six) hours as needed. 08/23/19   Lazaro ArmsEure, Luther H, MD  ondansetron (ZOFRAN ODT) 8 MG disintegrating tablet Take 1 tablet (8 mg total) by mouth every 8 (eight) hours as needed for nausea or vomiting. 08/23/19   Lazaro ArmsEure, Luther H, MD  traZODone (DESYREL) 100 MG tablet Take 100 mg by mouth at bedtime.    [provider]                                                                                                                                    Past Surgical History Past Surgical History:  Procedure Laterality Date   CERVICAL  CONIZATION W/BX Left 08/23/2019   Procedure: LASER ABLATION OF CERVIX;  Surgeon: Lazaro ArmsEure, Luther H, MD;  Location: [facility];  Service: Gynecology;  Laterality: Left;   INCISION AND DRAINAGE ABSCESS Left 10/30/2015   Procedure: INCISION AND DRAINAGE PERI-RECTAL ABSCESS;  Surgeon: Franky MachoMark Jenkins, MD;  Location: [facility];  Service: General;  Laterality: Left;   MULTIPLE TOOTH EXTRACTIONS  05/22/2022   Family History Family History  Problem Relation Age of Onset   Depression Mother    Anxiety disorder Mother    Depression Sister     Social History Social History   Tobacco Use   Smoking status: Every Day    Packs/day: 1.00    Years: 13.00    Total pack years: 13.00    Types: Cigarettes   Smokeless tobacco: Never  Vaping Use   Vaping Use: Never used  Substance Use Topics   Alcohol use: Not Currently    Comment: occassionally   Drug use: Yes    Types: Marijuana    Comment: occassionally   Allergies Gentamicin and Vancomycin  Review of Systems Review of Systems  All other systems reviewed and are negative.   Physical Exam Vital Signs  I have reviewed the triage vital signs BP 120/77   Pulse 96   Temp 98.7 F (37.1 C) (Oral)   Resp 17   Ht 5\' 3"  (1.6 m)   Wt 85.3 kg   SpO2 100%   BMI 33.30 kg/m  Physical Exam Vitals and nursing note reviewed.  Constitutional:      General: She is not in acute distress.    Appearance: She is well-developed.  HENT:     Head: Normocephalic and atraumatic.     Mouth/Throat:     Mouth: Mucous membranes are moist.  Eyes:     Pupils: Pupils are equal, round, and reactive to light.  Cardiovascular:     Rate and Rhythm: Normal rate and regular rhythm.     Heart sounds: No murmur heard. Pulmonary:     Effort: Pulmonary effort is normal. No respiratory distress.     Breath sounds: Normal breath sounds.  Abdominal:     General: Abdomen is flat.     Palpations: Abdomen is soft.     Tenderness: There is no abdominal tenderness.   Genitourinary:    Comments: Chaperoned by nurse technician, left buttock with mild medial tenderness, but no fluctuance or induration obviously palpable on physical exam.  Mild warmth and redness to the left buttock Musculoskeletal:        General: No tenderness.     Right lower leg: No edema.     Left lower leg: No edema.  Skin:    General: Skin is warm and dry.  Neurological:     General: No focal deficit present.     Mental Status: She is alert. Mental status is at baseline.  Psychiatric:        Mood and Affect: Mood normal.        Behavior: Behavior normal.     ED Results and Treatments Labs (all labs ordered are listed, but only abnormal results are displayed) Labs Reviewed  BASIC METABOLIC PANEL - Abnormal; Notable for the following components:      Result Value   Potassium 3.4 (*)    CO2 21 (*)    Glucose, Bld 113 (*)    All other components within normal limits  CBC WITH DIFFERENTIAL/PLATELET - Abnormal; Notable for the following components:   WBC 17.3 (*)    Neutro Abs 13.7 (*)    Monocytes Absolute 1.3 (*)    Abs Immature Granulocytes 0.12 (*)    All other components within normal limits  I-STAT BETA HCG BLOOD, ED ([facility], [facility], [facility] ONLY)  Radiology CT PELVIS W CONTRAST  Result Date: 06/05/2022 CLINICAL DATA:  Anorectal abscess. Sacral pain since yesterday. History of left buttock abscess. EXAM: CT PELVIS WITH CONTRAST TECHNIQUE: Multidetector CT imaging of the pelvis was performed using the standard protocol following the bolus administration of intravenous contrast. RADIATION DOSE REDUCTION: This exam was performed according to the departmental dose-optimization program which includes automated exposure control, adjustment of the mA and/or kV according to patient size and/or use of iterative reconstruction technique. CONTRAST:  174mL OMNIPAQUE IOHEXOL  300 MG/ML  SOLN COMPARISON:  Pelvic CT 12/05/2015 and 10/29/2015. FINDINGS: Urinary Tract: The visualized distal ureters and bladder appear unremarkable. Bowel: Possible mild anorectal wall thickening without evidence of fistula. No other bowel wall thickening, bowel distention or surrounding inflammation identified in the pelvis. The appendix appears normal. Vascular/Lymphatic: No enlarged pelvic lymph nodes identified. No significant vascular findings. Reproductive: The uterus and ovaries appear unremarkable. No adnexal mass. Other: There is a recurrent peripherally enhancing perianal fluid collection which is located posterolaterally on the left. This measures 3.8 x 2.4 cm on image 38/2 and has a maximal diameter of 5.2 cm on sagittal image 74/5. There is no gas within this collection. No other focal fluid collections identified, although there is diffuse perirectal soft tissue stranding. Musculoskeletal: No acute or worrisome osseous findings. The perianal fluid collection abuts the coccyx which demonstrates no destruction. The sacroiliac joints appear normal, without sacroiliitis. IMPRESSION: 1. Recurrent posterior perianal peripherally enhancing fluid collection suspicious for a perirectal abscess. Perirectal inflammatory changes with possible mild anorectal wall thickening. No discrete fistula identified. 2. No intrapelvic fluid collections or inflammatory changes. The appendix appears normal. Electronically Signed   By: Richardean Sale M.D.   On: 06/05/2022 10:35    Pertinent labs & imaging results that were available during my care of the patient were reviewed by me and considered in my medical decision making (see MDM for details).  Medications Ordered in ED Medications  piperacillin-tazobactam (ZOSYN) IVPB 3.375 g (3.375 g Intravenous New Bag/Given 06/05/22 1148)  ketorolac (TORADOL) 15 MG/ML injection 15 mg (15 mg Intravenous Given 06/05/22 0927)  iohexol (OMNIPAQUE) 300 MG/ML solution 100 mL (100  mLs Intravenous Contrast Given 06/05/22 1011)                                                                                                                                     Procedures Procedures  (including critical care time)  Medical Decision Making / ED Course   MDM:  31 year old female presenting to the emergency department with buttock pain.  Patient overall well-appearing, vitals notable for mild initial tachycardia.  Resolved in room, suspect related to pain.  Given no clearly palpable buttock abscess but significant tenderness, will obtain CT scan to evaluate for occult abscess.  If negative, likely treat for cellulitis.  No evidence of perirectal abscess.  No findings concerning for Fournier's gangrene.  Clinical Course as of 06/05/22  1201  Fri Jun 05, 2022  1200 Discussed results with Dr. Henreitta Leber.  Patient has CT scan with evidence of perirectal abscess.  Labs notable for elevated WBC count.  Antibiotics initiated.  Plan for surgery today with Dr. Henreitta Leber. [WS]    Clinical Course User Index [WS] Suezanne Jacquet Jerilee Field, MD     Additional history obtained: -Additional history obtained from spouse -External records from outside source obtained and reviewed including: Chart review including previous notes, labs, imaging, consultation notes   Lab Tests: -I ordered, reviewed, and interpreted labs.   The pertinent results include:   Labs Reviewed  BASIC METABOLIC PANEL - Abnormal; Notable for the following components:      Result Value   Potassium 3.4 (*)    CO2 21 (*)    Glucose, Bld 113 (*)    All other components within normal limits  CBC WITH DIFFERENTIAL/PLATELET - Abnormal; Notable for the following components:   WBC 17.3 (*)    Neutro Abs 13.7 (*)    Monocytes Absolute 1.3 (*)    Abs Immature Granulocytes 0.12 (*)    All other components within normal limits  I-STAT BETA HCG BLOOD, ED ([facility], [facility], [facility] ONLY)      EKG   EKG Interpretation  Date/Time:     Ventricular Rate:    PR Interval:    QRS Duration:   QT Interval:    QTC Calculation:   R Axis:     Text Interpretation:           Imaging Studies ordered: I ordered imaging studies including CT abdomen and pelvis On my interpretation imaging demonstrates perirectal abscess I independently visualized and interpreted imaging. I agree with the radiologist interpretation   Medicines ordered and prescription drug management: Meds ordered this encounter  Medications   DISCONTD: ketorolac (TORADOL) 15 MG/ML injection 15 mg   ketorolac (TORADOL) 15 MG/ML injection 15 mg   iohexol (OMNIPAQUE) 300 MG/ML solution 100 mL   piperacillin-tazobactam (ZOSYN) IVPB 3.375 g    Order Specific Question:   Antibiotic Indication:    Answer:   Cellulitis    -I have reviewed the patients home medicines and have made adjustments as needed   Consultations Obtained: I requested consultation with the general surgeon,  and discussed lab and imaging findings as well as pertinent plan - they recommend: Surgery   Cardiac Monitoring: The patient was maintained on a cardiac monitor.  I personally viewed and interpreted the cardiac monitored which showed an underlying rhythm of: Normal sinus rhythm  Social Determinants of Health:  Factors impacting patients care include: current smoker   Reevaluation: After the interventions noted above, I reevaluated the patient and found that they have improved  Co morbidities that complicate the patient evaluation  Past Medical History:  Diagnosis Date   Anxiety    Bipolar 1 disorder (HCC)    Depression    OCD (obsessive compulsive disorder)    Renal disorder    kidney stones      Dispostion: Admit    Final Clinical Impression(s) / ED Diagnoses Final diagnoses:  Perirectal abscess     This chart was dictated using voice recognition software.  Despite best efforts to proofread,  errors can occur which can change the documentation meaning.     Lonell Grandchild, MD 06/05/22 1201

## 2022-06-05 NOTE — Op Note (Signed)
[facility] Operative Note  06/05/22  Preoperative Diagnosis: Perirectal abscess    Postoperative Diagnosis: Perirectal abscess, chronic posterior fissure    Procedure(s) Performed: Incision and drainage, penrose drain placement    Surgeon: Lanell Matar. Constance Haw, MD   Assistants: No qualified resident was available    Anesthesia: General endotracheal   Anesthesiologist: Louann Sjogren, MD    Specimens: None   Estimated Blood Loss: Minimal   Blood Replacement: None    Complications: None   Wound Class: Dirty/ Infected   Operative Indications: Rachel Sellers is a 31 yo with a  recurrent perirectal abscess. We discussed I&D, possible drain and looking for a fistula to see if she had a track, we discussed fistulotomy versus seton placement. We discussed risk of bleeding, infection, incontinence, needing more procedures, and drain placement.   Findings: Posterior midline abscess at base of coccyx extending to anal verge superficially, no fistula noted on internal exam    Procedure: The patient was taken to the operating room and placed supine. General endotracheal anesthesia was induced. Intravenous antibiotics had already been given.  The perineal and anal areas were prepared and draped in the usual sterile fashion.   An internal exam revealed a possible area of fluctuance posterior deep in and at the level of the coccyx but nothing definitive. She had a chronic posterior fissure with rolled edges.  There was some fluctuance on the posterior anal verge between the anal verge and the coccyx area where the patient had been tender. I aspirated with a syringe and needle, and peroxide was injected into the cavity with a anoscope in place visualizing posteriorly. No peroxide was noted to come into the anal canal.  Given this there was no obvious fistula. I did not want to drain this internally so that a fistula was not created.  I opted to incision on the perianal skin at the 6  o'clock position where I had aspirated. Blood pus with peroxide was evacuated. A cavity was noted and a hemostat was use to break up loculations. I could palpate the coccyx there the skin and through the rectum and felt no other areas that needed drainage. I could feel the coccyx with the hemostat in the cavity which was consistent with the imaging. This was all in the subcutaneous and did not violate any sphincter muscle.    A 1/4 inch penrose was placed into the cavity given the difficulty patient's have with packing and was secured with a 3-0 Nylon suture. Exparel was injected into the area.   Final inspection revealed acceptable hemostasis. All counts were correct at the end of the case. The patient was awakened from anesthesia and extubated without complication.  The patient went to the PACU in stable condition.   Curlene Labrum, MD [facility] [address] [place], [place] [address] [phone] (office)

## 2022-06-05 NOTE — ED Triage Notes (Signed)
Pt has discolored buttocks , pain in sacral area since yesterday. Pt c/o dizziness, and lethargic. Pt has hx of abcess.

## 2022-06-05 NOTE — ED Triage Notes (Signed)
Pt from home with c/o fever after surgery for peri-rectal abscess this morning. Dr Constance Haw did surgery and instructed pt to come to ED if she started running fever, pt temp in triagee is 101.3 and HR is 126.

## 2022-06-05 NOTE — Anesthesia Preprocedure Evaluation (Signed)
Anesthesia Evaluation  Patient identified by MRN, date of birth, ID band Patient awake    Reviewed: Allergy & Precautions, H&P , NPO status , Patient's Chart, lab work & pertinent test results, reviewed documented beta blocker date and time   Airway Mallampati: II  TM Distance: >3 FB Neck ROM: full    Dental no notable dental hx.    Pulmonary neg pulmonary ROS, Current Smoker and Patient abstained from smoking.,    Pulmonary exam normal breath sounds clear to auscultation       Cardiovascular Exercise Tolerance: Good negative cardio ROS   Rhythm:regular Rate:Normal     Neuro/Psych PSYCHIATRIC DISORDERS Anxiety Depression Bipolar Disorder negative neurological ROS     GI/Hepatic negative GI ROS, Neg liver ROS,   Endo/Other  negative endocrine ROS  Renal/GU negative Renal ROS  negative genitourinary   Musculoskeletal   Abdominal   Peds  Hematology  (+) Blood dyscrasia, anemia ,   Anesthesia Other Findings   Reproductive/Obstetrics negative OB ROS                             Anesthesia Physical Anesthesia Plan  ASA: 2 and emergent  Anesthesia Plan: General and General ETT   Post-op Pain Management:    Induction:   PONV Risk Score and Plan: Ondansetron  Airway Management Planned:   Additional Equipment:   Intra-op Plan:   Post-operative Plan:   Informed Consent: I have reviewed the patients History and Physical, chart, labs and discussed the procedure including the risks, benefits and alternatives for the proposed anesthesia with the patient or authorized representative who has indicated his/her understanding and acceptance.     Dental Advisory Given  Plan Discussed with: CRNA  Anesthesia Plan Comments:         Anesthesia Quick Evaluation

## 2022-06-06 DIAGNOSIS — D72829 Elevated white blood cell count, unspecified: Secondary | ICD-10-CM | POA: Diagnosis not present

## 2022-06-06 DIAGNOSIS — A419 Sepsis, unspecified organism: Principal | ICD-10-CM

## 2022-06-06 DIAGNOSIS — E876 Hypokalemia: Secondary | ICD-10-CM | POA: Diagnosis not present

## 2022-06-06 DIAGNOSIS — K611 Rectal abscess: Secondary | ICD-10-CM | POA: Diagnosis not present

## 2022-06-06 LAB — COMPREHENSIVE METABOLIC PANEL
ALT: 23 U/L (ref 0–44)
AST: 23 U/L (ref 15–41)
Albumin: 3.1 g/dL — ABNORMAL LOW (ref 3.5–5.0)
Alkaline Phosphatase: 45 U/L (ref 38–126)
Anion gap: 4 — ABNORMAL LOW (ref 5–15)
BUN: 7 mg/dL (ref 6–20)
CO2: 23 mmol/L (ref 22–32)
Calcium: 7.7 mg/dL — ABNORMAL LOW (ref 8.9–10.3)
Chloride: 111 mmol/L (ref 98–111)
Creatinine, Ser: 0.63 mg/dL (ref 0.44–1.00)
GFR, Estimated: >60 mL/min (ref >60)
Glucose, Bld: 106 mg/dL — ABNORMAL HIGH (ref 70–99)
Potassium: 3.4 mmol/L — ABNORMAL LOW (ref 3.5–5.1)
Sodium: 138 mmol/L (ref 135–145)
Total Bilirubin: 0.7 mg/dL (ref 0.3–1.2)
Total Protein: 6.1 g/dL — ABNORMAL LOW (ref 6.5–8.1)

## 2022-06-06 LAB — CBC
HCT: 34.4 % — ABNORMAL LOW (ref 36.0–46.0)
Hemoglobin: 11.5 g/dL — ABNORMAL LOW (ref 12.0–15.0)
MCH: 29.9 pg (ref 26.0–34.0)
MCHC: 33.4 g/dL (ref 30.0–36.0)
MCV: 89.6 fL (ref 80.0–100.0)
Platelets: 276 10*3/uL (ref 150–400)
RBC: 3.84 MIL/uL — ABNORMAL LOW (ref 3.87–5.11)
RDW: 13.6 % (ref 11.5–15.5)
WBC: 16.9 10*3/uL — ABNORMAL HIGH (ref 4.0–10.5)
nRBC: 0 % (ref 0.0–0.2)

## 2022-06-06 LAB — PHOSPHORUS: Phosphorus: 2.2 mg/dL — ABNORMAL LOW (ref 2.5–4.6)

## 2022-06-06 LAB — HIV ANTIBODY (ROUTINE TESTING W REFLEX): HIV Screen 4th Generation wRfx: NONREACTIVE

## 2022-06-06 LAB — MAGNESIUM: Magnesium: 2 mg/dL (ref 1.7–2.4)

## 2022-06-06 MED ORDER — POTASSIUM CHLORIDE IN NACL 40-0.9 MEQ/L-% IV SOLN
INTRAVENOUS | Status: DC
  Filled 2022-06-06 (×3): qty 1000

## 2022-06-06 MED ORDER — POTASSIUM PHOSPHATES 15 MMOLE/5ML IV SOLN
15.0000 mmol | Freq: Once | INTRAVENOUS | Status: DC
  Filled 2022-06-06: qty 5

## 2022-06-06 MED ORDER — SODIUM CHLORIDE 0.9 % IV BOLUS
1000.0000 mL | Freq: Once | INTRAVENOUS | Status: AC
  Administered 2022-06-06: 1000 mL via INTRAVENOUS

## 2022-06-06 MED ORDER — MORPHINE SULFATE (PF) 2 MG/ML IV SOLN
2.0000 mg | INTRAVENOUS | Status: DC | PRN
  Administered 2022-06-06 – 2022-06-07 (×2): 2 mg via INTRAVENOUS
  Filled 2022-06-06: qty 1

## 2022-06-06 MED ORDER — OXYCODONE HCL 5 MG PO TABS: 5.0000 mg | ORAL_TABLET | ORAL | Status: DC | PRN

## 2022-06-06 MED ORDER — MORPHINE SULFATE (PF) 2 MG/ML IV SOLN
2.0000 mg | INTRAVENOUS | Status: DC | PRN
  Filled 2022-06-06: qty 1

## 2022-06-06 MED ORDER — OXYCODONE HCL 5 MG PO TABS
5.0000 mg | ORAL_TABLET | ORAL | Status: DC | PRN
  Administered 2022-06-06 (×2): 10 mg via ORAL
  Administered 2022-06-07: 5 mg via ORAL
  Administered 2022-06-07 – 2022-06-08 (×3): 10 mg via ORAL
  Filled 2022-06-06 (×4): qty 2
  Filled 2022-06-06: qty 1
  Filled 2022-06-06: qty 2

## 2022-06-06 MED ORDER — POTASSIUM PHOSPHATES 15 MMOLE/5ML IV SOLN
15.0000 mmol | Freq: Once | INTRAVENOUS | Status: AC
  Administered 2022-06-06: 15 mmol via INTRAVENOUS
  Filled 2022-06-06: qty 5

## 2022-06-06 MED ORDER — POTASSIUM CHLORIDE 10 MEQ/100ML IV SOLN: 10.0000 meq | INTRAVENOUS | Status: DC

## 2022-06-06 MED ORDER — ONDANSETRON HCL 4 MG/2ML IJ SOLN: 4.0000 mg | Freq: Four times a day (QID) | INTRAMUSCULAR | Status: DC | PRN

## 2022-06-06 MED ORDER — ONDANSETRON HCL 4 MG PO TABS: 4.0000 mg | ORAL_TABLET | Freq: Four times a day (QID) | ORAL | Status: DC | PRN

## 2022-06-06 MED ORDER — ORAL CARE MOUTH RINSE: 15.0000 mL | OROMUCOSAL | Status: DC | PRN

## 2022-06-06 MED ORDER — DOCUSATE SODIUM 100 MG PO CAPS
100.0000 mg | ORAL_CAPSULE | Freq: Two times a day (BID) | ORAL | Status: DC
  Administered 2022-06-06 – 2022-06-08 (×5): 100 mg via ORAL
  Filled 2022-06-06 (×5): qty 1

## 2022-06-06 MED ORDER — SODIUM CHLORIDE 0.9 % IV SOLN: INTRAVENOUS | Status: DC

## 2022-06-06 MED ORDER — POTASSIUM CHLORIDE CRYS ER 20 MEQ PO TBCR
40.0000 meq | EXTENDED_RELEASE_TABLET | Freq: Once | ORAL | Status: AC
  Administered 2022-06-06: 40 meq via ORAL
  Filled 2022-06-06: qty 2

## 2022-06-06 MED ORDER — METRONIDAZOLE 500 MG/100ML IV SOLN
500.0000 mg | Freq: Two times a day (BID) | INTRAVENOUS | Status: DC
  Administered 2022-06-06 – 2022-06-07 (×4): 500 mg via INTRAVENOUS
  Filled 2022-06-06 (×5): qty 100

## 2022-06-06 MED ORDER — CIPROFLOXACIN IN D5W 400 MG/200ML IV SOLN
400.0000 mg | Freq: Two times a day (BID) | INTRAVENOUS | Status: DC
  Administered 2022-06-06 – 2022-06-07 (×4): 400 mg via INTRAVENOUS
  Filled 2022-06-06 (×5): qty 200

## 2022-06-06 MED ORDER — POLYETHYLENE GLYCOL 3350 17 G PO PACK: 17.0000 g | PACK | Freq: Every day | ORAL | Status: DC | PRN

## 2022-06-06 MED ORDER — SODIUM CHLORIDE 0.9% FLUSH
10.0000 mL | INTRAVENOUS | Status: DC
  Administered 2022-06-07 – 2022-06-08 (×8): 10 mL

## 2022-06-06 MED ORDER — ACETAMINOPHEN 325 MG PO TABS
650.0000 mg | ORAL_TABLET | Freq: Four times a day (QID) | ORAL | Status: DC | PRN
  Administered 2022-06-06 – 2022-06-07 (×3): 650 mg via ORAL
  Filled 2022-06-06 (×3): qty 2

## 2022-06-06 NOTE — ED Provider Notes (Signed)
[facility] Emergency Department Provider Note MRN:  [id number]  Arrival date & time: 06/06/22     Chief Complaint   Fever (Surgery today)   History of Present Illness   Rachel Sellers is a 31 y.o. year-old female with a history of perirectal abscess presenting to the ED with chief complaint of fever.  Patient had surgery today for perirectal abscess, was told to come back if she spiked a fever.  Fever up to 103 at home.  Pain to the rectal area is mild, improved since surgery.  Has a drain in place.  Feels a bit lightheaded when she stands up.  Review of Systems  A thorough review of systems was obtained and all systems are negative except as noted in the HPI and PMH.   Patient's Health History    Past Medical History:  Diagnosis Date   Anxiety    Bipolar 1 disorder ([place])    Depression    OCD (obsessive compulsive disorder)    Renal disorder    kidney stones    Past Surgical History:  Procedure Laterality Date   CERVICAL CONIZATION W/BX Left 08/23/2019   Procedure: LASER ABLATION OF CERVIX;  Surgeon: Florian Buff, MD;  Location: [facility];  Service: Gynecology;  Laterality: Left;   INCISION AND DRAINAGE ABSCESS Left 10/30/2015   Procedure: INCISION AND DRAINAGE PERI-RECTAL ABSCESS;  Surgeon: Aviva Signs, MD;  Location: [facility];  Service: General;  Laterality: Left;   MULTIPLE TOOTH EXTRACTIONS  05/22/2022    Family History  Problem Relation Age of Onset   Depression Mother    Anxiety disorder Mother    Depression Sister     Social History   Socioeconomic History   Marital status: Single    Spouse name: Not on file   Number of children: Not on file   Years of education: Not on file   Highest education level: Not on file  Occupational History   Not on file  Tobacco Use   Smoking status: Every Day    Packs/day: 1.00    Years: 13.00    Total pack years: 13.00    Types: Cigarettes   Smokeless tobacco: Never  Vaping Use   Vaping Use:  Never used  Substance and Sexual Activity   Alcohol use: Not Currently    Comment: occassionally   Drug use: Yes    Types: Marijuana    Comment: occassionally   Sexual activity: Yes    Birth control/protection: None  Other Topics Concern   Not on file  Social History Narrative   Not on file   Social Determinants of Health   Financial Resource Strain: Not on file  Food Insecurity: Not on file  Transportation Needs: Not on file  Physical Activity: Not on file  Stress: Not on file  Social Connections: Not on file  Intimate Partner Violence: Not on file     Physical Exam   Vitals:   06/06/22 0600 06/06/22 0729  BP: 100/72   Pulse: 94   Resp: 16   Temp:  100.1 F (37.8 C)  SpO2: 98%     CONSTITUTIONAL: Well-appearing, NAD NEURO/PSYCH:  Alert and oriented x 3, no focal deficits EYES:  eyes equal and reactive ENT/NECK:  no LAD, no JVD CARDIO: Tachycardic rate, well-perfused, normal S1 and S2 PULM:  CTAB no wheezing or rhonchi GI/GU:  non-distended, non-tender MSK/SPINE:  No gross deformities, no edema SKIN:  no rash, atraumatic   *Additional and/or pertinent findings included  in MDM below  Diagnostic and Interventional Summary    EKG Interpretation  Date/Time:    Ventricular Rate:    PR Interval:    QRS Duration:   QT Interval:    QTC Calculation:   R Axis:     Text Interpretation:         Labs Reviewed  CBC WITH DIFFERENTIAL/PLATELET - Abnormal; Notable for the following components:      Result Value   WBC 21.2 (*)    HCT 35.4 (*)    Neutro Abs 17.7 (*)    Monocytes Absolute 1.2 (*)    Abs Immature Granulocytes 0.18 (*)    All other components within normal limits  COMPREHENSIVE METABOLIC PANEL - Abnormal; Notable for the following components:   Potassium 3.1 (*)    CO2 21 (*)    Glucose, Bld 121 (*)    Calcium 8.5 (*)    All other components within normal limits  COMPREHENSIVE METABOLIC PANEL - Abnormal; Notable for the following components:    Potassium 3.4 (*)    Glucose, Bld 106 (*)    Calcium 7.7 (*)    Total Protein 6.1 (*)    Albumin 3.1 (*)    Anion gap 4 (*)    All other components within normal limits  CBC - Abnormal; Notable for the following components:   WBC 16.9 (*)    RBC 3.84 (*)    Hemoglobin 11.5 (*)    HCT 34.4 (*)    All other components within normal limits  PHOSPHORUS - Abnormal; Notable for the following components:   Phosphorus 2.2 (*)    All other components within normal limits  MAGNESIUM  HIV ANTIBODY (ROUTINE TESTING W REFLEX)    No orders to display    Medications  ciprofloxacin (CIPRO) IVPB 400 mg (has no administration in time range)  metroNIDAZOLE (FLAGYL) IVPB 500 mg (has no administration in time range)  ondansetron (ZOFRAN) tablet 4 mg (has no administration in time range)    Or  ondansetron (ZOFRAN) injection 4 mg (has no administration in time range)  morphine (PF) 2 MG/ML injection 2 mg (has no administration in time range)  0.9 % NaCl with KCl 40 mEq / L  infusion ( Intravenous New Bag/Given 06/06/22 0600)  sodium chloride 0.9 % bolus 500 mL (0 mLs Intravenous Stopped 06/05/22 2342)  acetaminophen (TYLENOL) tablet 650 mg (650 mg Oral Given 06/05/22 2250)  sodium chloride 0.9 % bolus 1,000 mL (0 mLs Intravenous Stopped 06/06/22 0253)  Ampicillin-Sulbactam (UNASYN) 3 g in sodium chloride 0.9 % 100 mL IVPB (0 g Intravenous Stopped 06/06/22 0253)     Procedures  /  Critical Care Procedures  ED Course and Medical Decision Making  Initial Impression and Ddx Postop fever, recent perirectal abscess.  Rectal area with some mild erythema and tenderness, drain in place.  Patient is tachycardic in the 120s, temperature 102.6, overall fairly well-appearing.  Case discussed with Dr. Henreitta Leber, plan is to admit overnight and Dr. Henreitta Leber will evaluate in the morning.  Past medical/surgical history that increases complexity of ED encounter: Postop, recent perirectal abscess  Interpretation of  Diagnostics I personally reviewed the laboratory assessment and my interpretation is as follows: Leukocytosis, otherwise no significant blood count or electrolyte disturbance    Patient Reassessment and Ultimate Disposition/Management     Admission to hospital service.  Patient management required discussion with the following services or consulting groups:  Hospitalist Service and General/Trauma Surgery  Complexity of Problems Addressed Acute  illness or injury that poses threat of life of bodily function  Additional Data Reviewed and Analyzed Further history obtained from: Recent discharge summary and Recent Consult notes  Additional Factors Impacting ED Encounter Risk Consideration of hospitalization  Elmer Sow. Pilar Plate, MD [facility] Emergency Medicine [facility] [email or web address]  Final Clinical Impressions(s) / ED Diagnoses     ICD-10-CM   1. Postoperative fever  R50.82       ED Discharge Orders     None        Discharge Instructions Discussed with and Provided to Patient:   Discharge Instructions   None      Sabas Sous, MD 06/06/22 [phone]

## 2022-06-06 NOTE — ED Notes (Signed)
Pt provided w/ equipment for sitz bath.  Sts she is able to complete bath w/o assistance.  Pt directed to callout if any assistance is needed.

## 2022-06-06 NOTE — Progress Notes (Signed)
[facility]  Patient came back to Ed after her I&D yesterday with high fever over 102. Admitted at my request given risk of bacteremia with the infection and high fever.  Says her pain is ok but she is having pressure. Saline flushed into drain. Sitz bath requested and obtained for patient.  BP 106/71   Pulse 90   Temp 99.1 F (37.3 C) (Oral)   Resp 18   Ht 5\' 3"  (1.6 m)   Wt 85.3 kg   LMP 06/05/2022 Comment: currently on menstual period  SpO2 98%   BMI 33.30 kg/m  Penrose in place, tender around area but no erythema, saline flushed, soft  Patient s/p perirectal abscess I&D and drain placement.  Sitz bath, saline flush q4 hr Diet IV antibiotics Appreciate hospitalist help  Blood cultures obtained I think she needs to stay another night given the fever curve, will monitor  Ambulate once in [place], MD [facility] [address] [place], [place] [address] [phone] (office)

## 2022-06-06 NOTE — Progress Notes (Signed)
PROGRESS NOTE    Rachel Sellers  QMG:[id number] DOB: 1991-05-20 DOA: 06/05/2022 PCP: Patient, No Pcp Per   Brief Narrative:  HPI per Dr. Bernadette Hoit on 06/05/22 Rachel Sellers is a 31 y.o. female with medical history significant of anxiety, depression, who presents to the emergency department due to perirectal pain s/p incision and drainage of perirectal abscess and chronic posterior fissure yesterday.  Patient presented to the ED yesterday due to worsening perirectal pain that has been going on for a few days.  General surgery (Dr. Constance Haw) was consulted and I&D was done and patient was discharged home.  On arrival at home, patient states that she felt some pain around the surgical site when trying to urinate, she wanted to be sure that this was not due to an impending infection, so she returned to the ED for further evaluation and management.  She denies fever, chills, chest pain, shortness of breath, headache, nausea, vomiting.  Patient denies any history of inflammatory bowel disease.     ED Course:  In the emergency department, she was noted to be febrile with a temperature of 100.101F, but other vital signs are within normal range.  Work-up in the ED showed leukocytosis and normocytic anemia BMP was normal except for hypokalemia. CT pelvis with contrast: IMPRESSION: 1. Recurrent posterior perianal peripherally enhancing fluid collection suspicious for a perirectal abscess. Perirectal inflammatory changes with possible mild anorectal wall thickening. No discrete fistula identified. 2. No intrapelvic fluid collections or inflammatory changes. The appendix appears normal. Tylenol was given, Unasyn was provided and IV hydration was given. General surgery was consulted and recommended admitting patient with plan to consult on patient in the morning per ED physician.  Hospitalist was asked to admit patient for further evaluation and management.  **Interim History Patient was taken for  perirectal abscess incision and drainage and had a Penrose drain placement.  She is postoperative day 1 and feels better.  She denies any pain and states this is happened before but it was worse previously.  General surgery has now some primary attending.   Assessment and Plan:  Sepsis secondary to perirectal abscess status post incision and drainage-06/05/2022 -Met sepsis criteria on admission given that she is febrile 101.3, head a pulse rate of 126, respiratory rate of 22 and a source of infection -Patient was given a dose of IV Unasyn in the ED for wound infection -Continue with supportive care and she did receive 1500 mL boluses of fluid.   -Continue with acetaminophen 650 mg p.o. every 6 as needed for mild pain and fever,  -Antibiotic coverage with IV ciprofloxacin and IV Flagyl,  -further pain control with IV morphine 2 mg every 3 as needed severe pain and p.o. oxycodone 5 mg every 4 as needed for moderate pain -Continue with supportive care and now IV fluids have now stopped and continue with antiemetics with ondansetron 4 mg p.o./IV every 6 as needed -General surgery was consulted and took the patient to the OR yesterday and appreciate surgical evaluation   Leukocytosis in the setting of above -WBC 21.2, continue treatment as described above -Getting further work-up with blood cultures x2 -WBC is improved and went from 21.2 and is now 16.9 -Expect further improvement being on antibiotics -Follow cultures and continue monitor temperature and WBC curve  Metabolic Acidosis -Mild and improved -Patient's CO2 is now 23, chloride level is now 111, and anion gap is 4 -Continue monitor and trend and repeat CMP in a.m.  Normocytic Anemia -Patient's  hemoglobin/hematocrit went from 13.4/38.6 -> 12.3/35.4 -> 11.5/34.4 -Check anemia panel in a.m. -Continue to monitor for signs and symptoms of bleeding; no overt bleeding noted -Repeat CBC in a.m.  Hypokalemia -Patient's potassium level is  now 3.4 -Replete with IV K-Phos 15 mmol as well as p.o. KCl 40 mEq x 1 -Magnesium level is now 2.0 To continue to monitor and replete as necessary -Repeat CMP in the a.m.  Hypophosphatemia -Patient's phosphorus level is now 2.2 -Replete with IV K-Phos 15 mmol -Continue monitor and replete as necessary and repeat Phos level in a.m.  Hypoalbuminemia -Patient's albumin level is now gone from 3.7 is now 3.1 -Continue monitor and trend and repeat CMP in the a.m.  Obesity -Complicates overall prognosis and care -Estimated body mass index is 33.3 kg/m as calculated from the following:   Height as of this encounter: 5' 3" (1.6 m).   Weight as of this encounter: 85.3 kg.  -Weight Loss and Dietary Counseling given   DVT prophylaxis: SCDs Start: 06/06/22 0545    Code Status: Full Code Family Communication: No family currently at bedside  Disposition Plan:  Level of care: Med-Surg Status is: Inpatient Remains inpatient appropriate because: Needs further clinical improvement and clearance by surgery    Consultants:  General Surery   Procedures:   Incision and drainage, penrose drain placement   Antimicrobials:  Anti-infectives (From admission, onward)    Start     Dose/Rate Route Frequency Ordered Stop   06/06/22 1000  ciprofloxacin (CIPRO) IVPB 400 mg        400 mg 200 mL/hr over 60 Minutes Intravenous Every 12 hours 06/06/22 0524     06/06/22 1000  metroNIDAZOLE (FLAGYL) IVPB 500 mg        500 mg 100 mL/hr over 60 Minutes Intravenous Every 12 hours 06/06/22 0524     06/05/22 2345  Ampicillin-Sulbactam (UNASYN) 3 g in sodium chloride 0.9 % 100 mL IVPB        3 g 200 mL/hr over 30 Minutes Intravenous  Once 06/05/22 2330 06/06/22 0253       Subjective: Seen and examined at bedside and feels little bit better.  States that the last time she had a perirectal abscess it was way worse.  Denies any nausea or vomiting.  Feels okay.  Had a temperature earlier.  Denies any  lightheadedness.  No other concerns or complaints at this time.  Objective: Vitals:   06/05/22 2341 06/06/22 0257 06/06/22 0600 06/06/22 0729  BP: 112/64 101/66 100/72   Pulse:  94 94   Resp: _0 Temp: (!) 100.4 F (38 C) 98.6 F (37 C)  100.1 F (37.8 C)  TempSrc: Oral Oral  Oral  SpO2: 98% 98% 98%   Weight:      Height:        Intake/Output Summary (Last 24 hours) at 06/06/2022 4765 Last data filed at 06/06/2022 0253 Gross per 24 hour  Intake 1600 ml  Output --  Net 1600 ml   Filed Weights   06/05/22 2231  Weight: 85.3 kg   Examination: Physical Exam:  Constitutional: WN/WD obese Caucasian female currently no acute distress Respiratory: Diminished to auscultation bilaterally, no wheezing, rales, rhonchi or crackles. Normal respiratory effort and patient is not tachypenic. No accessory muscle use.  Unlabored breathing Cardiovascular: RRR, no murmurs / rubs / gallops. S1 and S2 auscultated. No extremity edema.  Abdomen: Soft, non-tender, distended secondary body habitus.  Bowel sounds positive.  GU: Deferred. Musculoskeletal:  No clubbing / cyanosis of digits/nails. No joint deformity upper and lower extremities.  Skin: No rashes, lesions, ulcers on limited skin evaluation but does have some tattoos noted. No induration; Warm and dry.  Neurologic: CN 2-12 grossly intact with no focal deficits.  Romberg sign and cerebellar reflexes not assessed.  Psychiatric: Normal judgment and insight. Alert and oriented x 3. Normal mood and appropriate affect.   Data Reviewed: I have personally reviewed following labs and imaging studies  CBC: Recent Labs  Lab 06/05/22 0904 06/05/22 2300 06/06/22 0558  WBC 17.3* 21.2* 16.9*  NEUTROABS 13.7* 17.7*  --   HGB 13.4 12.3 11.5*  HCT 38.6 35.4* 34.4*  MCV 87.1 87.0 89.6  PLT 331 312 496   Basic Metabolic Panel: Recent Labs  Lab 06/05/22 0904 06/05/22 2300 06/06/22 0558  NA 137 135 138  K 3.4* 3.1* 3.4*  CL 106 103 111   CO2 21* 21* 23  GLUCOSE 113* 121* 106*  BUN _0 CREATININE 0.62 0.76 0.63  CALCIUM 8.9 8.5* 7.7*  MG  --   --  2.0  PHOS  --   --  2.2*   GFR: Estimated Creatinine Clearance: 104.6 mL/min (by C-G formula based on SCr of 0.63 mg/dL). Liver Function Tests: Recent Labs  Lab 06/05/22 2300 06/06/22 0558  AST 28 23  ALT 26 23  ALKPHOS 55 45  BILITOT 1.0 0.7  PROT 7.3 6.1*  ALBUMIN 3.7 3.1*   No results for input(s): "LIPASE", "AMYLASE" in the last 168 hours. No results for input(s): "AMMONIA" in the last 168 hours. Coagulation Profile: No results for input(s): "INR", "PROTIME" in the last 168 hours. Cardiac Enzymes: No results for input(s): "CKTOTAL", "CKMB", "CKMBINDEX", "TROPONINI" in the last 168 hours. BNP (last 3 results) No results for input(s): "PROBNP" in the last 8760 hours. HbA1C: No results for input(s): "HGBA1C" in the last 72 hours. CBG: No results for input(s): "GLUCAP" in the last 168 hours. Lipid Profile: No results for input(s): "CHOL", "HDL", "LDLCALC", "TRIG", "CHOLHDL", "LDLDIRECT" in the last 72 hours. Thyroid Function Tests: No results for input(s): "TSH", "T4TOTAL", "FREET4", "T3FREE", "THYROIDAB" in the last 72 hours. Anemia Panel: No results for input(s): "VITAMINB12", "FOLATE", "FERRITIN", "TIBC", "IRON", "RETICCTPCT" in the last 72 hours. Sepsis Labs: No results for input(s): "PROCALCITON", "LATICACIDVEN" in the last 168 hours.  No results found for this or any previous visit (from the past 240 hour(s)).   Radiology Studies: CT PELVIS W CONTRAST  Result Date: 06/05/2022 CLINICAL DATA:  Anorectal abscess. Sacral pain since yesterday. History of left buttock abscess. EXAM: CT PELVIS WITH CONTRAST TECHNIQUE: Multidetector CT imaging of the pelvis was performed using the standard protocol following the bolus administration of intravenous contrast. RADIATION DOSE REDUCTION: This exam was performed according to the departmental dose-optimization  program which includes automated exposure control, adjustment of the mA and/or kV according to patient size and/or use of iterative reconstruction technique. CONTRAST:  153m OMNIPAQUE IOHEXOL 300 MG/ML  SOLN COMPARISON:  Pelvic CT 12/05/2015 and 10/29/2015. FINDINGS: Urinary Tract: The visualized distal ureters and bladder appear unremarkable. Bowel: Possible mild anorectal wall thickening without evidence of fistula. No other bowel wall thickening, bowel distention or surrounding inflammation identified in the pelvis. The appendix appears normal. Vascular/Lymphatic: No enlarged pelvic lymph nodes identified. No significant vascular findings. Reproductive: The uterus and ovaries appear unremarkable. No adnexal mass. Other: There is a recurrent peripherally enhancing perianal fluid collection which is located posterolaterally on the left. This measures 3.8 x  2.4 cm on image 38/2 and has a maximal diameter of 5.2 cm on sagittal image 74/5. There is no gas within this collection. No other focal fluid collections identified, although there is diffuse perirectal soft tissue stranding. Musculoskeletal: No acute or worrisome osseous findings. The perianal fluid collection abuts the coccyx which demonstrates no destruction. The sacroiliac joints appear normal, without sacroiliitis. IMPRESSION: 1. Recurrent posterior perianal peripherally enhancing fluid collection suspicious for a perirectal abscess. Perirectal inflammatory changes with possible mild anorectal wall thickening. No discrete fistula identified. 2. No intrapelvic fluid collections or inflammatory changes. The appendix appears normal. Electronically Signed   By: Richardean Sale M.D.   On: 06/05/2022 10:35    Scheduled Meds:  Continuous Infusions:  0.9 % NaCl with KCl 40 mEq / L 75 mL/hr at 06/06/22 0600   ciprofloxacin     metronidazole     potassium PHOSPHATE IVPB (in mmol)      LOS: 1 day   Raiford Noble, DO [facility] Available via Epic  secure chat 7am-7pm After these hours, please refer to coverage provider listed on [email or web address] 06/06/2022, 8:23 AM

## 2022-06-06 NOTE — Anesthesia Postprocedure Evaluation (Signed)
Anesthesia Post Note  Patient: Rachel Sellers  Procedure(s) Performed: IRRIGATION AND DEBRIDEMENT PERIRECTAL ABSCESS; PENROSE DRAIN PLACED (Rectum)  Patient location during evaluation: Phase II Anesthesia Type: General Level of consciousness: awake Pain management: pain level controlled Vital Signs Assessment: post-procedure vital signs reviewed and stable Respiratory status: spontaneous breathing and respiratory function stable Cardiovascular status: blood pressure returned to baseline and stable Postop Assessment: no headache and no apparent nausea or vomiting Anesthetic complications: no Comments: Late entry   No notable events documented.   Last Vitals:  Vitals:   06/05/22 1627 06/05/22 1629  BP:  119/60  Pulse: (!) 107   Resp: 18   Temp:    SpO2: 97% 98%    Last Pain:  Vitals:   06/05/22 1627  TempSrc:   PainSc: 0-No pain                 Louann Sjogren

## 2022-06-07 LAB — CBC WITH DIFFERENTIAL/PLATELET
Abs Immature Granulocytes: 0.08 10*3/uL — ABNORMAL HIGH (ref 0.00–0.07)
Basophils Absolute: 0 10*3/uL (ref 0.0–0.1)
Basophils Relative: 0 %
Eosinophils Absolute: 0.2 10*3/uL (ref 0.0–0.5)
Eosinophils Relative: 2 %
HCT: 32.1 % — ABNORMAL LOW (ref 36.0–46.0)
Hemoglobin: 11 g/dL — ABNORMAL LOW (ref 12.0–15.0)
Immature Granulocytes: 1 %
Lymphocytes Relative: 9 %
Lymphs Abs: 0.8 10*3/uL (ref 0.7–4.0)
MCH: 30.5 pg (ref 26.0–34.0)
MCHC: 34.3 g/dL (ref 30.0–36.0)
MCV: 88.9 fL (ref 80.0–100.0)
Monocytes Absolute: 0.7 10*3/uL (ref 0.1–1.0)
Monocytes Relative: 7 %
Neutro Abs: 7.6 10*3/uL (ref 1.7–7.7)
Neutrophils Relative %: 81 %
Platelets: 252 10*3/uL (ref 150–400)
RBC: 3.61 MIL/uL — ABNORMAL LOW (ref 3.87–5.11)
RDW: 13.5 % (ref 11.5–15.5)
WBC: 9.5 10*3/uL (ref 4.0–10.5)
nRBC: 0 % (ref 0.0–0.2)

## 2022-06-07 LAB — VITAMIN B12: Vitamin B-12: 217 pg/mL (ref 180–914)

## 2022-06-07 LAB — COMPREHENSIVE METABOLIC PANEL
ALT: 25 U/L (ref 0–44)
AST: 28 U/L (ref 15–41)
Albumin: 3.1 g/dL — ABNORMAL LOW (ref 3.5–5.0)
Alkaline Phosphatase: 46 U/L (ref 38–126)
Anion gap: 7 (ref 5–15)
BUN: <5 mg/dL — ABNORMAL LOW (ref 6–20)
CO2: 21 mmol/L — ABNORMAL LOW (ref 22–32)
Calcium: 7.8 mg/dL — ABNORMAL LOW (ref 8.9–10.3)
Chloride: 110 mmol/L (ref 98–111)
Creatinine, Ser: 0.58 mg/dL (ref 0.44–1.00)
GFR, Estimated: >60 mL/min (ref >60)
Glucose, Bld: 110 mg/dL — ABNORMAL HIGH (ref 70–99)
Potassium: 3.3 mmol/L — ABNORMAL LOW (ref 3.5–5.1)
Sodium: 138 mmol/L (ref 135–145)
Total Bilirubin: 0.8 mg/dL (ref 0.3–1.2)
Total Protein: 6 g/dL — ABNORMAL LOW (ref 6.5–8.1)

## 2022-06-07 LAB — RETICULOCYTES
Immature Retic Fract: 10.9 % (ref 2.3–15.9)
RBC.: 3.62 MIL/uL — ABNORMAL LOW (ref 3.87–5.11)
Retic Count, Absolute: 48.9 10*3/uL (ref 19.0–186.0)
Retic Ct Pct: 1.4 % (ref 0.4–3.1)

## 2022-06-07 LAB — IRON AND TIBC
Iron: 15 ug/dL — ABNORMAL LOW (ref 28–170)
Saturation Ratios: 6 % — ABNORMAL LOW (ref 10.4–31.8)
TIBC: 244 ug/dL — ABNORMAL LOW (ref 250–450)
UIBC: 229 ug/dL

## 2022-06-07 LAB — FERRITIN: Ferritin: 181 ng/mL (ref 11–307)

## 2022-06-07 LAB — PHOSPHORUS: Phosphorus: 2.2 mg/dL — ABNORMAL LOW (ref 2.5–4.6)

## 2022-06-07 LAB — MAGNESIUM: Magnesium: 2 mg/dL (ref 1.7–2.4)

## 2022-06-07 LAB — FOLATE: Folate: 14.2 ng/mL (ref >5.9)

## 2022-06-07 MED ORDER — ENOXAPARIN SODIUM 40 MG/0.4ML IJ SOSY
40.0000 mg | PREFILLED_SYRINGE | INTRAMUSCULAR | Status: DC
  Administered 2022-06-07: 40 mg via SUBCUTANEOUS
  Filled 2022-06-07: qty 0.4

## 2022-06-07 MED ORDER — POTASSIUM CHLORIDE 20 MEQ PO PACK
40.0000 meq | PACK | Freq: Two times a day (BID) | ORAL | Status: AC
  Administered 2022-06-07 (×2): 40 meq via ORAL
  Filled 2022-06-07 (×2): qty 2

## 2022-06-07 NOTE — Progress Notes (Signed)
PROGRESS NOTE  Rachel Sellers VQM:[id number] DOB: [birth year] DOA: 06/05/2022 PCP: Patient, No Pcp Per  Brief History:  Rachel Sellers is a 31 y.o. female with medical history significant of anxiety, depression, who presents to the emergency department due to perirectal pain s/p incision and drainage of perirectal abscess and chronic posterior fissure early in the day 06/05/22.  Initially, she presented to the ED yesterday due to worsening perirectal pain that has been going on for a few days.  General surgery (Dr. Constance Haw) was consulted and I&D was done and patient was discharged home from the ED with amox/clav and oxyIR.  On arrival at home, patient states that she felt some pain around the surgical site when trying to urinate, she wanted to be sure that this was not due to an impending infection, so she returned to the ED for further evaluation and management.  She denies fever, chills, chest pain, shortness of breath, headache, nausea, vomiting.  Patient denies any history of inflammatory bowel disease.   Perirectal abscess --case discussed with Dr. Constance Haw --Dr. Constance Haw graciously agrees to assume care for patient and be attending --defer further care to her --will be available for any medicine concerns      Objective: Vitals:   06/06/22 2216 06/07/22 0204 06/07/22 0207 06/07/22 0600  BP: 117/65 102/61  120/72  Pulse: 91 (!) 104  88  Resp: 20 18  18   Temp: 98.4 F (36.9 C) 99.3 F (37.4 C) (!) 101.5 F (38.6 C) 98.5 F (36.9 C)  TempSrc:  Oral Axillary Oral  SpO2: 98% 100%  99%  Weight:      Height:        Intake/Output Summary (Last 24 hours) at 06/07/2022 0651 Last data filed at 06/06/2022 1800 Gross per 24 hour  Intake 542.62 ml  Output --  Net 542.62 ml   Weight change: 1.324 kg Exam:    Data Reviewed: I have personally reviewed following labs and imaging studies Basic Metabolic Panel: Recent Labs  Lab 06/05/22 0904 06/05/22 2300 06/06/22 0558   NA 137 135 138  K 3.4* 3.1* 3.4*  CL 106 103 111  CO2 21* 21* 23  GLUCOSE 113* 121* 106*  BUN 8 10 7   CREATININE 0.62 0.76 0.63  CALCIUM 8.9 8.5* 7.7*  MG  --   --  2.0  PHOS  --   --  2.2*   Liver Function Tests: Recent Labs  Lab 06/05/22 2300 06/06/22 0558  AST 28 23  ALT 26 23  ALKPHOS 55 45  BILITOT 1.0 0.7  PROT 7.3 6.1*  ALBUMIN 3.7 3.1*   No results for input(s): "LIPASE", "AMYLASE" in the last 168 hours. No results for input(s): "AMMONIA" in the last 168 hours. Coagulation Profile: No results for input(s): "INR", "PROTIME" in the last 168 hours. CBC: Recent Labs  Lab 06/05/22 0904 06/05/22 2300 06/06/22 0558  WBC 17.3* 21.2* 16.9*  NEUTROABS 13.7* 17.7*  --   HGB 13.4 12.3 11.5*  HCT 38.6 35.4* 34.4*  MCV 87.1 87.0 89.6  PLT 331 312 276   Cardiac Enzymes: No results for input(s): "CKTOTAL", "CKMB", "CKMBINDEX", "TROPONINI" in the last 168 hours. BNP: Invalid input(s): "POCBNP" CBG: No results for input(s): "GLUCAP" in the last 168 hours. HbA1C: No results for input(s): "HGBA1C" in the last 72 hours. Urine analysis:    Component Value Date/Time   COLORURINE YELLOW 08/21/2019 1036   APPEARANCEUR HAZY (A) 08/21/2019 1036  LABSPEC 1.011 08/21/2019 1036   PHURINE 7.0 08/21/2019 1036   GLUCOSEU NEGATIVE 08/21/2019 1036   HGBUR NEGATIVE 08/21/2019 1036   BILIRUBINUR NEGATIVE 08/21/2019 1036   KETONESUR NEGATIVE 08/21/2019 1036   PROTEINUR NEGATIVE 08/21/2019 1036   UROBILINOGEN 0.2 10/09/2011 1712   NITRITE NEGATIVE 08/21/2019 1036   LEUKOCYTESUR NEGATIVE 08/21/2019 1036   Sepsis Labs: @LABRCNTIP (procalcitonin:4,lacticidven:4) ) Recent Results (from the past 240 hour(s))  Culture, blood (Routine X 2) w Reflex to ID Panel     Status: None (Preliminary result)   Collection Time: 06/06/22  9:25 AM   Specimen: Left Antecubital; Blood  Result Value Ref Range Status   Specimen Description   Final    LEFT ANTECUBITAL BOTTLES DRAWN AEROBIC AND  ANAEROBIC   Special Requests   Final    Blood Culture results may not be optimal due to an excessive volume of blood received in culture bottles   Culture   Final    NO GROWTH < 24 HOURS Performed at [facility], [address] [place], [place]    Report Status PENDING  Incomplete  Culture, blood (Routine X 2) w Reflex to ID Panel     Status: None (Preliminary result)   Collection Time: 06/06/22  9:34 AM   Specimen: BLOOD LEFT HAND  Result Value Ref Range Status   Specimen Description   Final    BLOOD LEFT HAND BOTTLES DRAWN AEROBIC AND ANAEROBIC   Special Requests   Final    Blood Culture results may not be optimal due to an excessive volume of blood received in culture bottles   Culture   Final    NO GROWTH < 24 HOURS Performed at [facility], [address] [place], [place]    Report Status PENDING  Incomplete     Scheduled Meds:  docusate sodium  100 mg Oral BID   sodium chloride flush  10 mL Other Q4H   Continuous Infusions:  sodium chloride 100 mL/hr at 06/06/22 2000   ciprofloxacin 400 mg (06/06/22 2309)   metronidazole 500 mg (06/06/22 2202)    Procedures/Studies: CT PELVIS W CONTRAST  Result Date: 06/05/2022 CLINICAL DATA:  Anorectal abscess. Sacral pain since yesterday. History of left buttock abscess. EXAM: CT PELVIS WITH CONTRAST TECHNIQUE: Multidetector CT imaging of the pelvis was performed using the standard protocol following the bolus administration of intravenous contrast. RADIATION DOSE REDUCTION: This exam was performed according to the departmental dose-optimization program which includes automated exposure control, adjustment of the mA and/or kV according to patient size and/or use of iterative reconstruction technique. CONTRAST:  06/07/2022 OMNIPAQUE IOHEXOL 300 MG/ML  SOLN COMPARISON:  Pelvic CT 12/05/2015 and 10/29/2015. FINDINGS: Urinary Tract: The visualized distal ureters and bladder appear unremarkable. Bowel: Possible mild anorectal  wall thickening without evidence of fistula. No other bowel wall thickening, bowel distention or surrounding inflammation identified in the pelvis. The appendix appears normal. Vascular/Lymphatic: No enlarged pelvic lymph nodes identified. No significant vascular findings. Reproductive: The uterus and ovaries appear unremarkable. No adnexal mass. Other: There is a recurrent peripherally enhancing perianal fluid collection which is located posterolaterally on the left. This measures 3.8 x 2.4 cm on image 38/2 and has a maximal diameter of 5.2 cm on sagittal image 74/5. There is no gas within this collection. No other focal fluid collections identified, although there is diffuse perirectal soft tissue stranding. Musculoskeletal: No acute or worrisome osseous findings. The perianal fluid collection abuts the coccyx which demonstrates no destruction. The sacroiliac joints appear  normal, without sacroiliitis. IMPRESSION: 1. Recurrent posterior perianal peripherally enhancing fluid collection suspicious for a perirectal abscess. Perirectal inflammatory changes with possible mild anorectal wall thickening. No discrete fistula identified. 2. No intrapelvic fluid collections or inflammatory changes. The appendix appears normal. Electronically Signed   By: Carey Bullocks M.D.   On: 06/05/2022 10:35    Catarina Hartshorn, DO  [facility]  If 7PM-7AM, please contact night-coverage [email or web address] Password [facility] 06/07/2022, 6:51 AM   LOS: 2 days

## 2022-06-07 NOTE — Progress Notes (Addendum)
[facility] Progress Note     Subjective: Pain improved with saline flush and sitz baths. Purulent drainage. Fever curve improving this am. No fever since 2Am. Wbc down.   Objective: Vital signs in last 24 hours: Temp:  [98.4 F (36.9 C)-103.2 F (39.6 C)] 98.5 F (36.9 C) (09/24 1000) Pulse Rate:  [88-108] 88 (09/24 0600) Resp:  [18-30] 18 (09/24 0600) BP: (102-120)/(61-76) 120/72 (09/24 0600) SpO2:  [92 %-100 %] 99 % (09/24 0600) Weight:  [86.6 kg] 86.6 kg (09/23 1753) Last BM Date : 06/05/22 (per patient)  Intake/Output from previous day: 09/23 0701 - 09/24 0700 In: 542.6 [IV Piggyback:542.6] Out: -  Intake/Output this shift: No intake/output data recorded.  General appearance: alert and no distress Incision/Wound: perirectal drain in place, less tender, purulent drainage, bruising on buttocks bilaterally  Lab Results:  Recent Labs    06/06/22 0558 06/07/22 0619  WBC 16.9* 9.5  HGB 11.5* 11.0*  HCT 34.4* 32.1*  PLT 276 252   BMET Recent Labs    06/06/22 0558 06/07/22 0619  NA 138 138  K 3.4* 3.3*  CL 111 110  CO2 23 21*  GLUCOSE 106* 110*  BUN 7 <5*  CREATININE 0.63 0.58  CALCIUM 7.7* 7.8*   PT/INR No results for input(s): "LABPROT", "INR" in the last 72 hours.  Studies/Results: No results found.  Anti-infectives: Anti-infectives (From admission, onward)    Start     Dose/Rate Route Frequency Ordered Stop   06/06/22 1000  ciprofloxacin (CIPRO) IVPB 400 mg        400 mg 200 mL/hr over 60 Minutes Intravenous Every 12 hours 06/06/22 0524     06/06/22 1000  metroNIDAZOLE (FLAGYL) IVPB 500 mg        500 mg 100 mL/hr over 60 Minutes Intravenous Every 12 hours 06/06/22 0524     06/05/22 2345  Ampicillin-Sulbactam (UNASYN) 3 g in sodium chloride 0.9 % 100 mL IVPB        3 g 200 mL/hr over 30 Minutes Intravenous  Once 06/05/22 2330 06/06/22 0253       Assessment/Plan: Patient s/p I&D of perirectal abscess tracking to coccyx.  High fevers on admission and had another 101.5 this AM at 2. Blood culture negative. Wbc improving, pain improving. Since she had the high fever this AM want to keep her another 24 hr to make sure again no bacteremia shows up on her culture obtained yesterday.   Iv antibiotics Anemia- acute blood loss anemia, iron and saturation low and ferritin normal and TIBC low, not indicating any iron deficiency but more blood loss with inflammation  Sepsis on admission with improving fever curve, tachycardia resolved, and leukocytosis resolved.  Hypoalbuminemia checked by the Hospitalist yesterday- low due to inflammation / infection / acute phase reactant  Stop IVF Replace K orally, hypokalemia 3.3  Ambulating, SCDs lovenox ordered  Labs in AM Hopefully home tomorrow    LOS: 2 days    Virl Cagey 06/07/2022

## 2022-06-07 NOTE — Hospital Course (Signed)
Rachel Sellers is a 31 y.o. female with medical history significant of anxiety, depression, who presents to the emergency department due to perirectal pain s/p incision and drainage of perirectal abscess and chronic posterior fissure early in the day 06/05/22.  Initially, she presented to the ED yesterday due to worsening perirectal pain that has been going on for a few days.  General surgery (Dr. Constance Haw) was consulted and I&D was done and patient was discharged home from the ED with amox/clav and oxyIR.  On arrival at home, patient states that she felt some pain around the surgical site when trying to urinate, she wanted to be sure that this was not due to an impending infection, so she returned to the ED for further evaluation and management.  She denies fever, chills, chest pain, shortness of breath, headache, nausea, vomiting.  Patient denies any history of inflammatory bowel disease.

## 2022-06-08 LAB — CBC WITH DIFFERENTIAL/PLATELET
Abs Immature Granulocytes: 0.04 10*3/uL (ref 0.00–0.07)
Basophils Absolute: 0 10*3/uL (ref 0.0–0.1)
Basophils Relative: 0 %
Eosinophils Absolute: 0.5 10*3/uL (ref 0.0–0.5)
Eosinophils Relative: 8 %
HCT: 31 % — ABNORMAL LOW (ref 36.0–46.0)
Hemoglobin: 10.4 g/dL — ABNORMAL LOW (ref 12.0–15.0)
Immature Granulocytes: 1 %
Lymphocytes Relative: 18 %
Lymphs Abs: 1.1 10*3/uL (ref 0.7–4.0)
MCH: 30.1 pg (ref 26.0–34.0)
MCHC: 33.5 g/dL (ref 30.0–36.0)
MCV: 89.6 fL (ref 80.0–100.0)
Monocytes Absolute: 0.8 10*3/uL (ref 0.1–1.0)
Monocytes Relative: 13 %
Neutro Abs: 3.9 10*3/uL (ref 1.7–7.7)
Neutrophils Relative %: 60 %
Platelets: 267 10*3/uL (ref 150–400)
RBC: 3.46 MIL/uL — ABNORMAL LOW (ref 3.87–5.11)
RDW: 13.8 % (ref 11.5–15.5)
WBC: 6.4 10*3/uL (ref 4.0–10.5)
nRBC: 0 % (ref 0.0–0.2)

## 2022-06-08 LAB — BASIC METABOLIC PANEL
Anion gap: 4 — ABNORMAL LOW (ref 5–15)
BUN: 6 mg/dL (ref 6–20)
CO2: 24 mmol/L (ref 22–32)
Calcium: 7.8 mg/dL — ABNORMAL LOW (ref 8.9–10.3)
Chloride: 109 mmol/L (ref 98–111)
Creatinine, Ser: 0.64 mg/dL (ref 0.44–1.00)
GFR, Estimated: >60 mL/min (ref >60)
Glucose, Bld: 116 mg/dL — ABNORMAL HIGH (ref 70–99)
Potassium: 3.2 mmol/L — ABNORMAL LOW (ref 3.5–5.1)
Sodium: 137 mmol/L (ref 135–145)

## 2022-06-08 MED ORDER — POTASSIUM CHLORIDE 20 MEQ PO PACK: 40.0000 meq | PACK | Freq: Two times a day (BID) | ORAL | Status: DC

## 2022-06-08 MED ORDER — DOCUSATE SODIUM 100 MG PO CAPS: 100.0000 mg | ORAL_CAPSULE | Freq: Two times a day (BID) | ORAL | 0 refills | Status: DC

## 2022-06-08 NOTE — Discharge Summary (Signed)
Physician Discharge Summary  Patient ID: Rachel Sellers MRN: [id number] DOB/AGE: 1990-09-21 31 y.o.  Admit date: 06/05/2022 Discharge date: 06/08/2022  Admission Diagnoses: Perirectal abscess, sepsis, hypokalemia   Discharge Diagnoses:  Active Problems:   Hypokalemia   Discharged Condition: good  Hospital Course: Ms. Harral is a 31 yo who had a perirectal abscess I&D and then represented later that night for fever that was over 102 and tachycardia. She met sepsis criteria with her tachycardia and fever and leukocytosis. She was brought into the hospital for antibiotics, resuscitation and blood cultures. Over the course of the last few days the sepsis resolved with her fever curve and heart rate returning to normal and her leukocytosis resolving. She was having adequate drainage and ambulating and had adequate pain control at her discharge. Her blood cultures had not grown any bacteria to date.   She was sent home to complete the 5 days course of antibiotics I had given her post op, so she will have had over 7 days of antibiotics with the inpatient IV antibiotics.   She has hypokalemia treated during her stay likely related to her decreased intake and fluids given.   Consults:  hospitalist admission- taken over by surgery in AM   Significant Diagnostic Studies:   Latest Reference Range & Units 06/05/22 09:04 06/05/22 23:00 06/06/22 05:58 06/07/22 06:19 06/08/22 06:13  WBC 4.0 - 10.5 K/uL 17.3 (H) 21.2 (H) 16.9 (H) 9.5 6.4  RBC 3.87 - 5.11 MIL/uL 4.43 4.07 3.84 (L) 3.61 (L) 3.46 (L)  Hemoglobin 12.0 - 15.0 g/dL 13.4 12.3 11.5 (L) 11.0 (L) 10.4 (L)  HCT 36.0 - 46.0 % 38.6 35.4 (L) 34.4 (L) 32.1 (L) 31.0 (L)  MCV 80.0 - 100.0 fL 87.1 87.0 89.6 88.9 89.6  MCH 26.0 - 34.0 pg 30.2 30.2 29.9 30.5 30.1  MCHC 30.0 - 36.0 g/dL 34.7 34.7 33.4 34.3 33.5  RDW 11.5 - 15.5 % 13.2 13.3 13.6 13.5 13.8  Platelets 150 - 400 K/uL 331 312 276 252 267  nRBC 0.0 - 0.2 % 0.0 0.0 0.0 0.0 0.0  Neutrophils %  80 84  81 60  Lymphocytes % _0 Monocytes Relative % _1 Eosinophil % 0 _2 Basophil % 0 0  0 0  Immature Granulocytes % _3 NEUT# 1.7 - 7.7 K/uL 13.7 (H) 17.7 (H)  7.6 3.9  Lymphocyte # 0.7 - 4.0 K/uL 2.1 1.8  0.8 1.1  Monocyte # 0.1 - 1.0 K/uL 1.3 (H) 1.2 (H)  0.7 0.8  Eosinophils Absolute 0.0 - 0.5 K/uL 0.0 0.3  0.2 0.5  Basophils Absolute 0.0 - 0.1 K/uL 0.1 0.0  0.0 0.0  Abs Immature Granulocytes 0.00 - 0.07 K/uL 0.12 (H) 0.18 (H)  0.08 (H) 0.04  RBC. 3.87 - 5.11 MIL/uL    3.62 (L)   Retic Ct Pct 0.4 - 3.1 %    1.4   Retic Count, Absolute 19.0 - 186.0 K/uL    48.9   Immature Retic Fract 2.3 - 15.9 %    10.9    Treatments: IV hydration, antibiotics: Cipro and metronidazole, and sitz baths after I&D 9/25   Discharge Exam: Blood pressure 108/69, pulse 87, temperature 98.5 F (36.9 C), temperature source Oral, resp. rate 18, height _4  (1.6 m), weight 86.6 kg, last menstrual period 06/05/2022, SpO2 100 %. General appearance: alert and no distress Pelvic: perirectal drain in place posterior midline,  some opening left lateral to area where additional drainage opening up, purulent drainage, minor induration, minor tenderness   Disposition: Discharge disposition: 01-Home or Self Care       Discharge Instructions     Call MD for:  difficulty breathing, headache or visual disturbances   Complete by: As directed    Call MD for:  extreme fatigue   Complete by: As directed    Call MD for:  persistant dizziness or light-headedness   Complete by: As directed    Call MD for:  persistant nausea and vomiting   Complete by: As directed    Call MD for:  redness, tenderness, or signs of infection (pain, swelling, redness, odor or green/yellow discharge around incision site)   Complete by: As directed    Call MD for:  severe uncontrolled pain   Complete by: As directed    Call MD for:  temperature >100.4   Complete by: As directed    Increase activity slowly    Complete by: As directed       Allergies as of 06/08/2022       Reactions   Gentamicin Other (See Comments)   Renal issues.   Vancomycin Other (See Comments)   Kidney failure        Medication List     TAKE these medications    amoxicillin-clavulanate 875-125 MG tablet Commonly known as: AUGMENTIN Take 1 tablet by mouth 2 (two) times daily for 5 days.   docusate sodium 100 MG capsule Commonly known as: COLACE Take 1 capsule (100 mg total) by mouth 2 (two) times daily. While taking narcotics   ibuprofen 200 MG tablet Commonly known as: ADVIL Take 800 mg by mouth every 6 (six) hours as needed.   ondansetron 4 MG tablet Commonly known as: Zofran Take 1 tablet (4 mg total) by mouth every 8 (eight) hours as needed.   oxyCODONE 5 MG immediate release tablet Commonly known as: Roxicodone Take 1 tablet (5 mg total) by mouth every 4 (four) hours as needed for breakthrough pain or severe pain.        Follow-up Information     Virl Cagey, MD Follow up on 06/16/2022.   Specialty: General Surgery Why: 9/26 and 10/3 for drain check Contact information: [address] [place]na [address] [facility] [address] [phone]                 Signed: Virl Cagey 06/08/2022, 10:58 AM

## 2022-06-08 NOTE — Discharge Instructions (Signed)
Keep stool soft, colace sent in to pharmacy or you can get over the counter.  Sitz baths  Take augmentin. Tylenol, ibuprofen and as needed narcotics for pain.

## 2022-06-09 ENCOUNTER — Ambulatory Visit (INDEPENDENT_AMBULATORY_CARE_PROVIDER_SITE_OTHER): Payer: BLUE CROSS/BLUE SHIELD | Admitting: General Surgery

## 2022-06-09 ENCOUNTER — Encounter: Payer: Self-pay | Admitting: General Surgery

## 2022-06-09 VITALS — BP 109/76 | HR 87 | Temp 97.7°F | Resp 16 | Ht 63.0 in | Wt 189.0 lb

## 2022-06-09 DIAGNOSIS — K601 Chronic anal fissure: Secondary | ICD-10-CM

## 2022-06-09 NOTE — Patient Instructions (Signed)
Sitz baths Keep stools soft Call with changes

## 2022-06-10 NOTE — Progress Notes (Signed)
[facility]  Drainage continues. Pain improved. Fever curve improved.  BP 109/76   Pulse 87   Temp 97.7 F (36.5 C) (Oral)   Resp 16   Ht 5\' 3"  (1.6 m)   Wt 189 lb (85.7 kg)   LMP 06/05/2022 Comment: currently on menstual period  SpO2 97%   BMI 33.48 kg/m  Drain in place, no erythema, + drainage Minor induration  Patient s/p I&D perirectal abscess. Doing well. Admitted for fever over the weekend and IV antibiotics. Blood cultures negative to date.   Sitz baths Keep stools soft Call with changes   Future Appointments  Date Time Provider [place]  06/16/2022  9:00 AM Virl Cagey, MD [facility] None     Curlene Labrum, MD [facility] [address] [place], [place] [address] [phone] (office)

## 2022-06-11 LAB — CULTURE, BLOOD (ROUTINE X 2)
Culture: NO GROWTH
Culture: NO GROWTH

## 2022-06-15 ENCOUNTER — Encounter (HOSPITAL_COMMUNITY): Payer: Self-pay | Admitting: General Surgery

## 2022-06-16 ENCOUNTER — Ambulatory Visit (INDEPENDENT_AMBULATORY_CARE_PROVIDER_SITE_OTHER): Payer: BC Managed Care – PPO | Admitting: General Surgery

## 2022-06-16 ENCOUNTER — Encounter: Payer: Self-pay | Admitting: General Surgery

## 2022-06-16 VITALS — BP 119/72 | HR 73 | Temp 98.1°F | Resp 12 | Ht 63.0 in | Wt 189.0 lb

## 2022-06-16 DIAGNOSIS — L0231 Cutaneous abscess of buttock: Secondary | ICD-10-CM

## 2022-06-16 NOTE — Patient Instructions (Signed)
Flush with saline twice a day. Keep doing sitz baths.

## 2022-06-16 NOTE — Progress Notes (Signed)
[facility]  Doing well. Minimal drainage. Feels some induration but improving.  BP 119/72   Pulse 73   Temp 98.1 F (36.7 C) (Oral)   Resp 12   Ht 5\' 3"  (1.6 m)   Wt 189 lb (85.7 kg)   LMP 06/05/2022 Comment: currently on menstual period  SpO2 96%   BMI 33.48 kg/m  Penrose in place, minimal drainage, softer than prior   Patient s/p I&D of her perianal abscess. Drain removed today. Doing well.  Flush with saline twice a day. Keep doing sitz baths.  Has fissure but not really causing issues right now, having Bms and minimal discomfort, will monitor   Future Appointments  Date Time Provider [place]  06/30/2022  9:45 AM Virl Cagey, MD [facility] None   Curlene Labrum, MD [facility] [address] [place], [place] [address] [phone] (office)

## 2022-06-30 ENCOUNTER — Encounter: Payer: Self-pay | Admitting: General Surgery

## 2022-06-30 ENCOUNTER — Ambulatory Visit (INDEPENDENT_AMBULATORY_CARE_PROVIDER_SITE_OTHER): Payer: BC Managed Care – PPO | Admitting: General Surgery

## 2022-06-30 VITALS — BP 121/82 | HR 82 | Temp 98.0°F | Resp 14 | Ht 63.0 in | Wt 188.0 lb

## 2022-06-30 DIAGNOSIS — L0231 Cutaneous abscess of buttock: Secondary | ICD-10-CM

## 2022-06-30 NOTE — Progress Notes (Signed)
[facility]  Doing well. Having some soreness at the buttock area / coccyx. No swelling and no drainage. No redness. Overall doing better.  BP 121/82   Pulse 82   Temp 98 F (36.7 C) (Oral)   Resp 14   Ht 5\' 3"  (1.6 m)   Wt 188 lb (85.3 kg)   LMP 06/05/2022 Comment: currently on menstual period  SpO2 93%   BMI 33.30 kg/m  No induration or drainage Posterior I&D site healing, small area not epithelized   Patient s/p I&D of perianal abscess. Doing well. I think the soreness is from the cavity collapsing and scarring in. No signs of infection.   Stretch the area. Sitz baths as needed. Call with issues.   PRN follow up  Curlene Labrum, MD [facility] [address] [place], [place] [address] [phone] (office)

## 2022-06-30 NOTE — Patient Instructions (Signed)
Stretch the area. Sitz baths as needed.  Call with issues.

## 2022-07-15 ENCOUNTER — Encounter: Payer: Self-pay | Admitting: General Surgery

## 2022-07-15 ENCOUNTER — Ambulatory Visit (INDEPENDENT_AMBULATORY_CARE_PROVIDER_SITE_OTHER): Payer: BC Managed Care – PPO | Admitting: General Surgery

## 2022-07-15 VITALS — BP 134/92 | HR 99 | Temp 98.4°F | Resp 14 | Ht 63.0 in | Wt 192.0 lb

## 2022-07-15 DIAGNOSIS — L0231 Cutaneous abscess of buttock: Secondary | ICD-10-CM

## 2022-07-15 MED ORDER — AMOXICILLIN-POT CLAVULANATE 875-125 MG PO TABS: 1.0000 | ORAL_TABLET | Freq: Two times a day (BID) | ORAL | 0 refills | Status: AC

## 2022-07-15 MED ORDER — OXYCODONE HCL 5 MG PO TABS: 5.0000 mg | ORAL_TABLET | ORAL | 0 refills | Status: DC | PRN

## 2022-07-15 NOTE — Progress Notes (Signed)
[facility]  Patient with new swelling and pain in the left buttock area. This is different from the more posterior region we had dealt with a few months back.   Area is draining now.   I am concerned she may have a fistula that I did not appreciate during her I&D.   BP (!) 134/92   Pulse 99   Temp 98.4 F (36.9 C) (Oral)   Resp 14   Ht 5\' 3"  (1.6 m)   Wt 192 lb (87.1 kg)   SpO2 98%   BMI 34.01 kg/m  Perianal region left buttock region with prior incision healed, drainage, fluctuant area  Patient with perianal abscess, draining.   Sitz baths Augmentin If the area stops draining or pain worsens call of go to the ED. [phone]; [phone] on weekend or evening   Future Appointments  Date Time Provider [place]  07/23/2022  2:30 PM Virl Cagey, MD [facility] None  07/31/2022  2:30 PM Coral Spikes, DO [facility] [place], MD [facility] [address] [place], [place] [address] [phone] (office)

## 2022-07-15 NOTE — Patient Instructions (Signed)
Sitz baths Antibiotics If the area stops draining or pain worsens call of go to the ED. [phone]; [phone] on weekend or evening

## 2022-07-23 ENCOUNTER — Encounter: Payer: Self-pay | Admitting: General Surgery

## 2022-07-23 ENCOUNTER — Ambulatory Visit: Payer: BC Managed Care – PPO | Admitting: General Surgery

## 2022-07-23 VITALS — BP 106/71 | HR 82 | Temp 98.1°F | Resp 12 | Ht 63.0 in | Wt 197.0 lb

## 2022-07-23 DIAGNOSIS — L0231 Cutaneous abscess of buttock: Secondary | ICD-10-CM

## 2022-07-23 DIAGNOSIS — K603 Anal fistula: Secondary | ICD-10-CM | POA: Insufficient documentation

## 2022-07-23 NOTE — Patient Instructions (Signed)
Anal Fistulotomy  Anal fistulotomy is a surgical procedure to open and drain an anal fistula. An anal fistula is an abnormal tunnel that develops between the bowel and the skin near the outside of the anus, where stool (feces) comes out. During this procedure, the fistula is opened up and the contents are drained to promote healing. Tell a health care provider about: Any allergies you have. All medicines you are taking, including vitamins, herbs, eye drops, creams, and over-the-counter medicines. Any problems you or family members have had with anesthetic medicines. Any blood disorders you have. Any surgeries you have had. Any medical conditions you have. Any problems you have controlling your bowel movements (incontinence). Whether you are pregnant or may be pregnant. What are the risks? Generally, this is a safe procedure. However, problems may occur, including: Infection. Bleeding. Allergic reactions to medicines or dyes. Damage to nearby structures or organs. Incontinence or leaking of stool. Being unable to empty your bladder (urinary retention). Needing more surgery, if the fistula returns. What happens before the procedure? Medicines Ask your health care provider about: Changing or stopping your regular medicines. This is especially important if you are taking diabetes medicines or blood thinners. Taking medicines such as aspirin and ibuprofen. These medicines can thin your blood. Do not take these medicines unless your health care provider tells you to take them. Taking over-the-counter medicines, vitamins, herbs, and supplements. Surgery safety Ask your health care provider: How your surgery site will be marked. What steps will be taken to help prevent infection. These may include: Removing hair at the surgery site. Washing skin with a germ-killing soap. Taking antibiotic medicine. General instructions Do not use any products that contain nicotine or tobacco for at least 4  weeks before the procedure. These products include cigarettes, e-cigarettes, and chewing tobacco. If you need help quitting, ask your health care provider. You may have an exam or testing. You may have a blood or urine sample taken. You may be told to take a medicine that helps you have a bowel movement (laxative) or use an enema to clean your bowels before surgery. Plan to have someone take you home from the hospital or clinic. Plan to have a responsible adult care for you for at least 24 hours after you leave the hospital or clinic. This is important. What happens during the procedure? An IV will be inserted into one of your veins. You will be given one or more of the following: A medicine to help you relax (sedative). A medicine to numb the area (local anesthetic). A medicine to make you fall asleep (general anesthetic). Your surgeon will locate the internal opening of your fistula. An incision will be made in the fistula opening. The incision may extend into the muscles around the anus (sphincter muscles). The fistula will be cut open and drained. Gauze bandages (dressings) may be placed inside of the fistula. The procedure may vary among health care providers and hospitals. What happens after the procedure?  Your blood pressure, heart rate, breathing rate, and blood oxygen level will be monitored until you leave the hospital or clinic. Do not drive for 24 hours if you were given a sedative during your procedure. You may continue to receive fluids and medicines through an IV. You may have some bleeding from your incision. You may have to wear a pad to absorb blood. Summary Anal fistulotomy is a surgical procedure to open and drain an anal fistula, which is an abnormal tunnel that develops between the   bowel and the skin near the outside of the anus. Before the procedure, tell your health care provider about any medical problems you have or have had, including problems controlling bowel  movements. You will be told what to eat and drink before the procedure, and what medicines to change or stop. Follow these instructions carefully. This is a safe procedure. However, problems may occur, including incontinence or leaking of stool and being unable to empty your bladder (urinary retention). You may have some bleeding from your incision after the procedure. You may have to wear a pad to absorb blood. This information is not intended to replace advice given to you by your health care provider. Make sure you discuss any questions you have with your health care provider. Document Revised: 02/13/2019 Document Reviewed: 02/13/2019 Elsevier Patient Education  2023 Elsevier Inc.  

## 2022-07-23 NOTE — Progress Notes (Signed)
[facility] History and Physical       Chief Complaint   Follow-up        Rachel Sellers is a 31 y.o. female.  HPI: Doing much better after her left buttock started draining and antibiotics started. She is worried about it happening again in the area as she had an abscess in this location a few years ago.   She is otherwise doing well.        Past Medical History:  Diagnosis Date   Anxiety     Bipolar 1 disorder (HCC)     Depression     OCD (obsessive compulsive disorder)     Renal disorder      kidney stones           Past Surgical History:  Procedure Laterality Date   CERVICAL CONIZATION W/BX Left 08/23/2019    Procedure: LASER ABLATION OF CERVIX;  Surgeon: Eure, Luther H, MD;  Location: [facility];  Service: Gynecology;  Laterality: Left;   INCISION AND DRAINAGE ABSCESS Left 10/30/2015    Procedure: INCISION AND DRAINAGE PERI-RECTAL ABSCESS;  Surgeon: Mark Jenkins, MD;  Location: [facility];  Service: General;  Laterality: Left;   INCISION AND DRAINAGE PERIRECTAL ABSCESS N/A 06/05/2022    Procedure: IRRIGATION AND DEBRIDEMENT PERIRECTAL ABSCESS; PENROSE DRAIN PLACED;  Surgeon: Demaris Leavell C, MD;  Location: [facility];  Service: General;  Laterality: N/A;   MULTIPLE TOOTH EXTRACTIONS   05/22/2022           Family History  Problem Relation Age of Onset   Depression Mother     Anxiety disorder Mother     Depression Sister        Social History         Tobacco Use   Smoking status: Every Day      Packs/day: 1.00      Years: 13.00      Total pack years: 13.00      Types: Cigarettes   Smokeless tobacco: Never  Vaping Use   Vaping Use: Never used  Substance Use Topics   Alcohol use: Not Currently      Comment: occassionally   Drug use: Yes      Types: Marijuana      Comment: occassionally      Medications: I have reviewed the patient's current medications. Allergies as of 07/23/2022         Reactions    Gentamicin Other (See Comments)     Renal issues.    Vancomycin Other (See Comments)    Kidney failure            Medication List           Accurate as of July 23, 2022  2:57 PM. If you have any questions, ask your nurse or doctor.              amoxicillin-clavulanate 875-125 MG tablet Commonly known as: AUGMENTIN Take 1 tablet by mouth 2 (two) times daily for 10 days.    docusate sodium 100 MG capsule Commonly known as: COLACE Take 1 capsule (100 mg total) by mouth 2 (two) times daily. While taking narcotics    ibuprofen 200 MG tablet Commonly known as: ADVIL Take 800 mg by mouth every 6 (six) hours as needed.    ondansetron 4 MG tablet Commonly known as: Zofran Take 1 tablet (4 mg total) by mouth every 8 (eight) hours as needed.    oxyCODONE 5 MG immediate release tablet Commonly   region  Blood pressure 106/71, pulse 82, temperature 98.1 F (36.7 C), temperature source Oral, resp. rate 12, height 5\' 3"  (1.6 m), weight 197 lb (89.4 kg), SpO2 95 %. Physical Exam Vitals reviewed.  HENT:     Head: Normocephalic.     Mouth/Throat:     Mouth: Mucous membranes are moist.  Eyes:     Extraocular Movements: Extraocular movements intact.  Cardiovascular:     Rate and Rhythm: Normal rate.  Pulmonary:     Effort: Pulmonary effort is normal.     Breath sounds: Normal breath sounds.  Abdominal:     General: There is no distension.     Palpations: Abdomen is soft.     Tenderness: There is no abdominal tenderness.  Genitourinary:    Comments: Perianal exam deferred today Musculoskeletal:        General: Normal range of motion.  Skin:     General: Skin is warm.  Neurological:     General: No focal deficit present.     Mental Status: She is alert.  Psychiatric:        Mood and Affect: Mood normal.        Behavior: Behavior normal.     Results:    Assessment & Plan:  Rachel Sellers is a 31 y.o. female with concern for fistula due to having repeat abscess. The are I drained did not obviously had one but this was more posterior. The left buttock abscess has now occurred twice. Discussed potential for fistula and risk of abscess occurring again. Discussed risk of exam under anesthesia, possible fistulotomy versus seton and risk of bleeding, infection, sphincter injury, incontinence, needing a seton and referral to colorectal or finding nothing.    All questions were answered to the satisfaction of the patient and family.    34 07/23/2022, 2:57 PM

## 2022-07-28 NOTE — Patient Instructions (Signed)
Rachel Sellers  07/28/2022     @PREFPERIOPPHARMACY @   Your procedure is scheduled on  07/31/2022.   Report to 08/02/2022 at  1155  P.M.   Call this number if you have problems the morning of surgery:  [phone]  If you experience any cold or flu symptoms such as cough, fever, chills, shortness of breath, etc. between now and your scheduled surgery, please notify [phone] at the above number.   Remember:  Do not eat or drink after midnight.      Take these medicines the morning of surgery with A SIP OF WATER                              zofran (if needed).     Do not wear jewelry, make-up or nail polish.  Do not wear lotions, powders, or perfumes, or deodorant.  Do not shave 48 hours prior to surgery.  Men may shave face and neck.  Do not bring valuables to the hospital.  [facility] is not responsible for any belongings or valuables.  Contacts, dentures or bridgework may not be worn into surgery.  Leave your suitcase in the car.  After surgery it may be brought to your room.  For patients admitted to the hospital, discharge time will be determined by your treatment team.  Patients discharged the day of surgery will not be allowed to drive home and must have someone with them for 24 hours.    Special instructions:   DO NOT smoke tobacco or vape for 24 hours before your procedure.  Please read over the following fact sheets that you were given. Coughing and Deep Breathing, Surgical Site Infection Prevention, Anesthesia Post-op Instructions, and Care and Recovery After Surgery      Anal Fistulotomy, Care After This sheet gives you information about how to care for yourself after your procedure. Your health care provider may also give you more specific instructions. If you have problems or questions, contact your health care provider. What can I expect after the procedure? After the procedure, it is common to have: Some pain, discomfort, and  swelling. Increased pain during bowel movements. Some bleeding from the incision area. Some leakage of stool. Follow these instructions at home: Medicines Take over-the-counter and prescription medicines only as told by your health care provider. If you were prescribed an antibiotic medicine, take it as told by your health care provider. Do not stop taking the antibiotic even if you start to feel better. Ask your health care provider if the medicine prescribed to you requires you to avoid driving or using heavy machinery. Incision care  Follow instructions from your health care provider about how to take care of your incision. Make sure you: Wash your hands with soap and water before and after you remove your gauze (dressing). If soap and water are not available, use hand sanitizer. Remove your dressing as told by your health care provider. In some cases, you may be told not to remove the dressing, but to allow it to come out with your first bowel movement after surgery. Leave stitches (sutures), skin glue, or adhesive strips in place. These skin closures may need to stay in place for 2 weeks or longer. Do not remove adhesive strips completely unless your health care provider tells you to do that. Keep the incision area clean and dry. Check your incision area every day for signs  of infection. Check for: More redness, swelling, or pain. More fluid or blood. Warmth. Pus or a bad smell. Self-care After a bowel movement, clean the incision area using one of the following methods: Gently wipe with a moist towelette. Gently wipe with mild soap and water. Take a shower. Take a sitz bath. This is a shallow, warm-water bath that attaches to the toilet bowl. You can also sit in a bathtub filled with warm water. Do not swim or use a hot tub until your health care provider approves. To reduce discomfort, you may apply ice to the incision area. To do this: Put ice in a plastic bag. Place a towel  between your skin and the bag. Leave the ice on for 20 minutes, 2-3 times a day. Activity  Rest as told by your health care provider. Avoid sitting for a long time without moving. Get up to take short walks every 1-2 hours. This is important to improve blood flow and breathing. Ask for help if you feel weak or unsteady. Do not drive for 24 hours if you were given a sedative during your procedure. Do not lift anything that is heavier than 10 lb (4.5 kg), or the limit that you are told, until your health care provider says that it is safe. Return to your normal activities as told by your health care provider. Ask your health care provider what activities are safe for you. Eating and drinking Follow instructions from your health care provider about eating or drinking restrictions. You may need to take these actions to prevent or treat constipation: Drink enough fluid to keep your urine pale yellow. Eat foods that are high in fiber, such as beans, whole grains, and fresh fruits and vegetables. Limit foods that are high in fat and processed sugars, such as fried or sweet foods. Lifestyle Do not use any products that contain nicotine or tobacco, such as cigarettes, e-cigarettes, and chewing tobacco. If you need help quitting, ask your health care provider. Do not drink alcohol if: Your health care provider tells you not to drink. You are pregnant, may be pregnant, or are planning to become pregnant. If you drink alcohol: Limit how much you use to: 0-1 drink a day for women. 0-2 drinks a day for men. Be aware of how much alcohol is in your drink. In the [place] one drink equals one 12 oz bottle of beer (355 mL), one 5 oz glass of wine (148 mL), or one 1 oz glass of hard liquor (44 mL). General instructions If you have bleeding from the incision area, wear a pad to absorb blood. Change it often. Keep all follow-up visits as told by your health care provider. This is important. Contact a health  care provider if: You have any of these signs of infection: More redness, swelling, or pain around your incision area. Warmth coming from your incision area, or your incision area is firm. Pus or a bad smell coming from your incision area. A fever or chills. You develop swelling or tenderness in your groin area. You cannot control your bowel movements (incontinence), or you are leaking stool. You have trouble urinating. You have pain that does not get better with medicine. Get help right away if: You have severe pain in your abdomen or incision area. You have sudden chest pain. You become weak or you faint. You have more fluid or blood coming from your incision. You have bleeding from your incision that soaks 2 or more pads during 24 hours.  Summary After anal fistulotomy, it is common to have increased pain during bowel movements and some bleeding. If a dressing was placed in your incision during surgery, remove it as told by your health care provider. It is important to keep the incision area clean and dry after each bowel movement. If you have bleeding from the incision area, wear a pad to absorb blood. Change it often. This information is not intended to replace advice given to you by your health care provider. Make sure you discuss any questions you have with your health care provider. Document Revised: 02/13/2019 Document Reviewed: 02/13/2019 Elsevier Patient Education  2023 [employer] General Anesthesia, Adult, Care After The following information offers guidance on how to care for yourself after your procedure. Your health care provider may also give you more specific instructions. If you have problems or questions, contact your health care provider. What can I expect after the procedure? After the procedure, it is common for people to: Have pain or discomfort at the IV site. Have nausea or vomiting. Have a sore throat or hoarseness. Have trouble concentrating. Feel cold or  chills. Feel weak, sleepy, or tired (fatigue). Have soreness and body aches. These can affect parts of the body that were not involved in surgery. Follow these instructions at home: For the time period you were told by your health care provider:  Rest. Do not participate in activities where you could fall or become injured. Do not drive or use machinery. Do not drink alcohol. Do not take sleeping pills or medicines that cause drowsiness. Do not make important decisions or sign legal documents. Do not take care of children on your own. General instructions Drink enough fluid to keep your urine pale yellow. If you have sleep apnea, surgery and certain medicines can increase your risk for breathing problems. Follow instructions from your health care provider about wearing your sleep device: Anytime you are sleeping, including during daytime naps. While taking prescription pain medicines, sleeping medicines, or medicines that make you drowsy. Return to your normal activities as told by your health care provider. Ask your health care provider what activities are safe for you. Take over-the-counter and prescription medicines only as told by your health care provider. Do not use any products that contain nicotine or tobacco. These products include cigarettes, chewing tobacco, and vaping devices, such as e-cigarettes. These can delay incision healing after surgery. If you need help quitting, ask your health care provider. Contact a health care provider if: You have nausea or vomiting that does not get better with medicine. You vomit every time you eat or drink. You have pain that does not get better with medicine. You cannot urinate or have bloody urine. You develop a skin rash. You have a fever. Get help right away if: You have trouble breathing. You have chest pain. You vomit blood. These symptoms may be an emergency. Get help right away. Call 911. Do not wait to see if the symptoms will  go away. Do not drive yourself to the hospital. Summary After the procedure, it is common to have a sore throat, hoarseness, nausea, vomiting, or to feel weak, sleepy, or fatigue. For the time period you were told by your health care provider, do not drive or use machinery. Get help right away if you have difficulty breathing, have chest pain, or vomit blood. These symptoms may be an emergency. This information is not intended to replace advice given to you by your health care provider. Make sure you  discuss any questions you have with your health care provider. Document Revised: 11/28/2021 Document Reviewed: 11/28/2021 Elsevier Patient Education  2023 [employer]

## 2022-07-28 NOTE — H&P (Signed)
[facility] History and Physical       Chief Complaint   Follow-up        Rachel Sellers is a 31 y.o. female.  HPI: Doing much better after her left buttock started draining and antibiotics started. She is worried about it happening again in the area as she had an abscess in this location a few years ago.   She is otherwise doing well.        Past Medical History:  Diagnosis Date   Anxiety     Bipolar 1 disorder ([place])     Depression     OCD (obsessive compulsive disorder)     Renal disorder      kidney stones           Past Surgical History:  Procedure Laterality Date   CERVICAL CONIZATION W/BX Left 08/23/2019    Procedure: LASER ABLATION OF CERVIX;  Surgeon: Florian Buff, MD;  Location: [facility];  Service: Gynecology;  Laterality: Left;   INCISION AND DRAINAGE ABSCESS Left 10/30/2015    Procedure: INCISION AND DRAINAGE PERI-RECTAL ABSCESS;  Surgeon: Aviva Signs, MD;  Location: [facility];  Service: General;  Laterality: Left;   INCISION AND DRAINAGE PERIRECTAL ABSCESS N/A 06/05/2022    Procedure: IRRIGATION AND DEBRIDEMENT PERIRECTAL ABSCESS; PENROSE DRAIN PLACED;  Surgeon: Virl Cagey, MD;  Location: [facility];  Service: General;  Laterality: N/A;   MULTIPLE TOOTH EXTRACTIONS   05/22/2022           Family History  Problem Relation Age of Onset   Depression Mother     Anxiety disorder Mother     Depression Sister        Social History         Tobacco Use   Smoking status: Every Day      Packs/day: 1.00      Years: 13.00      Total pack years: 13.00      Types: Cigarettes   Smokeless tobacco: Never  Vaping Use   Vaping Use: Never used  Substance Use Topics   Alcohol use: Not Currently      Comment: occassionally   Drug use: Yes      Types: Marijuana      Comment: occassionally      Medications: I have reviewed the patient's current medications. Allergies as of 07/23/2022         Reactions    Gentamicin Other (See Comments)     Renal issues.    Vancomycin Other (See Comments)    Kidney failure            Medication List           Accurate as of July 23, 2022  2:57 PM. If you have any questions, ask your nurse or doctor.              amoxicillin-clavulanate 875-125 MG tablet Commonly known as: AUGMENTIN Take 1 tablet by mouth 2 (two) times daily for 10 days.    docusate sodium 100 MG capsule Commonly known as: COLACE Take 1 capsule (100 mg total) by mouth 2 (two) times daily. While taking narcotics    ibuprofen 200 MG tablet Commonly known as: ADVIL Take 800 mg by mouth every 6 (six) hours as needed.    ondansetron 4 MG tablet Commonly known as: Zofran Take 1 tablet (4 mg total) by mouth every 8 (eight) hours as needed.    oxyCODONE 5 MG immediate release tablet Commonly  known as: Roxicodone Take 1 tablet (5 mg total) by mouth every 4 (four) hours as needed for breakthrough pain or severe pain.    oxyCODONE 5 MG immediate release tablet Commonly known as: Roxicodone Take 1 tablet (5 mg total) by mouth every 4 (four) hours as needed for severe pain or breakthrough pain.               ROS:  A comprehensive review of systems was negative except for: Gastrointestinal: positive for resolved pain and drainage from the perianal region   Blood pressure 106/71, pulse 82, temperature 98.1 F (36.7 C), temperature source Oral, resp. rate 12, height 5\' 3"  (1.6 m), weight 197 lb (89.4 kg), SpO2 95 %. Physical Exam Vitals reviewed.  HENT:     Head: Normocephalic.     Mouth/Throat:     Mouth: Mucous membranes are moist.  Eyes:     Extraocular Movements: Extraocular movements intact.  Cardiovascular:     Rate and Rhythm: Normal rate.  Pulmonary:     Effort: Pulmonary effort is normal.     Breath sounds: Normal breath sounds.  Abdominal:     General: There is no distension.     Palpations: Abdomen is soft.     Tenderness: There is no abdominal tenderness.  Genitourinary:    Comments:  Perianal exam deferred today Musculoskeletal:        General: Normal range of motion.  Skin:    General: Skin is warm.  Neurological:     General: No focal deficit present.     Mental Status: She is alert.  Psychiatric:        Mood and Affect: Mood normal.        Behavior: Behavior normal.        Results:       Assessment & Plan:  Rachel Sellers is a 31 y.o. female with concern for fistula due to having repeat abscess. The are I drained did not obviously had one but this was more posterior. The left buttock abscess has now occurred twice. Discussed potential for fistula and risk of abscess occurring again. Discussed risk of exam under anesthesia, possible fistulotomy versus seton and risk of bleeding, infection, sphincter injury, incontinence, needing a seton and referral to colorectal or finding nothing.      All questions were answered to the satisfaction of the patient and family.       Virl Cagey 07/23/2022, 2:57 PM

## 2022-07-30 ENCOUNTER — Encounter (HOSPITAL_COMMUNITY): Payer: Self-pay

## 2022-07-30 ENCOUNTER — Encounter (HOSPITAL_COMMUNITY)
Admission: RE | Admit: 2022-07-30 | Discharge: 2022-07-30 | Disposition: A | Discharge status: Home / Self Care | Payer: BC Managed Care – PPO | Source: Ambulatory Visit | Attending: General Surgery | Admitting: General Surgery

## 2022-07-30 VITALS — BP 113/74 | HR 84 | Temp 98.1°F | Resp 18 | Ht 63.0 in | Wt 197.0 lb

## 2022-07-30 DIAGNOSIS — D649 Anemia, unspecified: Secondary | ICD-10-CM | POA: Insufficient documentation

## 2022-07-30 DIAGNOSIS — Z01818 Encounter for other preprocedural examination: Secondary | ICD-10-CM | POA: Insufficient documentation

## 2022-07-30 DIAGNOSIS — E876 Hypokalemia: Secondary | ICD-10-CM | POA: Insufficient documentation

## 2022-07-30 DIAGNOSIS — F17210 Nicotine dependence, cigarettes, uncomplicated: Secondary | ICD-10-CM | POA: Diagnosis not present

## 2022-07-30 DIAGNOSIS — F17200 Nicotine dependence, unspecified, uncomplicated: Secondary | ICD-10-CM | POA: Insufficient documentation

## 2022-07-30 DIAGNOSIS — K603 Anal fistula: Secondary | ICD-10-CM | POA: Diagnosis not present

## 2022-07-30 LAB — CBC WITH DIFFERENTIAL/PLATELET
Abs Immature Granulocytes: 0.02 10*3/uL (ref 0.00–0.07)
Basophils Absolute: 0 10*3/uL (ref 0.0–0.1)
Basophils Relative: 0 %
Eosinophils Absolute: 0.2 10*3/uL (ref 0.0–0.5)
Eosinophils Relative: 2 %
HCT: 40.9 % (ref 36.0–46.0)
Hemoglobin: 13.8 g/dL (ref 12.0–15.0)
Immature Granulocytes: 0 %
Lymphocytes Relative: 37 %
Lymphs Abs: 3.4 10*3/uL (ref 0.7–4.0)
MCH: 30 pg (ref 26.0–34.0)
MCHC: 33.7 g/dL (ref 30.0–36.0)
MCV: 88.9 fL (ref 80.0–100.0)
Monocytes Absolute: 0.6 10*3/uL (ref 0.1–1.0)
Monocytes Relative: 7 %
Neutro Abs: 4.9 10*3/uL (ref 1.7–7.7)
Neutrophils Relative %: 54 %
Platelets: 326 10*3/uL (ref 150–400)
RBC: 4.6 MIL/uL (ref 3.87–5.11)
RDW: 13.6 % (ref 11.5–15.5)
WBC: 9.1 10*3/uL (ref 4.0–10.5)
nRBC: 0 % (ref 0.0–0.2)

## 2022-07-30 LAB — BASIC METABOLIC PANEL
Anion gap: 9 (ref 5–15)
BUN: 13 mg/dL (ref 6–20)
CO2: 22 mmol/L (ref 22–32)
Calcium: 9 mg/dL (ref 8.9–10.3)
Chloride: 106 mmol/L (ref 98–111)
Creatinine, Ser: 0.61 mg/dL (ref 0.44–1.00)
GFR, Estimated: >60 mL/min (ref >60)
Glucose, Bld: 94 mg/dL (ref 70–99)
Potassium: 3.8 mmol/L (ref 3.5–5.1)
Sodium: 137 mmol/L (ref 135–145)

## 2022-07-30 LAB — POCT PREGNANCY, URINE: Preg Test, Ur: NEGATIVE

## 2022-07-31 ENCOUNTER — Encounter (HOSPITAL_COMMUNITY)
Admission: RE | Disposition: A | Discharge status: Home / Self Care | Payer: Self-pay | Source: Home / Self Care | Attending: General Surgery

## 2022-07-31 ENCOUNTER — Ambulatory Visit: Payer: BC Managed Care – PPO | Admitting: Family Medicine

## 2022-07-31 ENCOUNTER — Ambulatory Visit (HOSPITAL_COMMUNITY)
Admission: RE | Admit: 2022-07-31 | Discharge: 2022-07-31 | Disposition: A | Discharge status: Home / Self Care | Payer: BC Managed Care – PPO | Attending: General Surgery | Admitting: General Surgery

## 2022-07-31 ENCOUNTER — Ambulatory Visit (HOSPITAL_COMMUNITY): Payer: BC Managed Care – PPO | Admitting: Certified Registered Nurse Anesthetist

## 2022-07-31 ENCOUNTER — Other Ambulatory Visit: Payer: Self-pay

## 2022-07-31 ENCOUNTER — Encounter (HOSPITAL_COMMUNITY): Payer: Self-pay | Admitting: General Surgery

## 2022-07-31 DIAGNOSIS — K603 Anal fistula: Secondary | ICD-10-CM | POA: Insufficient documentation

## 2022-07-31 DIAGNOSIS — L0231 Cutaneous abscess of buttock: Secondary | ICD-10-CM | POA: Diagnosis not present

## 2022-07-31 DIAGNOSIS — K60329 Anal fistula, complex, unspecified: Secondary | ICD-10-CM | POA: Diagnosis present

## 2022-07-31 DIAGNOSIS — F17210 Nicotine dependence, cigarettes, uncomplicated: Secondary | ICD-10-CM | POA: Insufficient documentation

## 2022-07-31 HISTORY — PX: ANAL FISTULOTOMY: SHX6423

## 2022-07-31 SURGERY — ANAL FISTULOTOMY
Anesthesia: General | Site: Rectum

## 2022-07-31 MED ORDER — SODIUM CHLORIDE 0.9 % IR SOLN
Status: DC | PRN
  Administered 2022-07-31: 1000 mL

## 2022-07-31 MED ORDER — PROPOFOL 10 MG/ML IV BOLUS
INTRAVENOUS | Status: DC | PRN
  Administered 2022-07-31: 50 mg via INTRAVENOUS
  Administered 2022-07-31: 200 mg via INTRAVENOUS

## 2022-07-31 MED ORDER — LIDOCAINE VISCOUS HCL 2 % MT SOLN
OROMUCOSAL | Status: AC
  Filled 2022-07-31: qty 15

## 2022-07-31 MED ORDER — LIDOCAINE VISCOUS HCL 2 % MT SOLN
OROMUCOSAL | Status: DC | PRN
  Administered 2022-07-31: 1 via OROMUCOSAL

## 2022-07-31 MED ORDER — HYDROCODONE-ACETAMINOPHEN 7.5-325 MG PO TABS
1.0000 | ORAL_TABLET | Freq: Once | ORAL | Status: AC | PRN
  Administered 2022-07-31: 1 via ORAL
  Filled 2022-07-31: qty 1

## 2022-07-31 MED ORDER — FENTANYL CITRATE (PF) 100 MCG/2ML IJ SOLN
INTRAMUSCULAR | Status: AC
  Filled 2022-07-31: qty 2

## 2022-07-31 MED ORDER — BUPIVACAINE LIPOSOME 1.3 % IJ SUSP
INTRAMUSCULAR | Status: DC | PRN
  Administered 2022-07-31: 20 mL

## 2022-07-31 MED ORDER — CHLORHEXIDINE GLUCONATE CLOTH 2 % EX PADS: 6.0000 | MEDICATED_PAD | Freq: Once | CUTANEOUS | Status: DC

## 2022-07-31 MED ORDER — METHYLENE BLUE 1 % INJ SOLN
INTRAVENOUS | Status: DC | PRN
  Administered 2022-07-31: 1 mL

## 2022-07-31 MED ORDER — ONDANSETRON HCL 4 MG/2ML IJ SOLN: 4.0000 mg | Freq: Once | INTRAMUSCULAR | Status: DC | PRN

## 2022-07-31 MED ORDER — CEFAZOLIN SODIUM-DEXTROSE 2-4 GM/100ML-% IV SOLN
INTRAVENOUS | Status: AC
  Filled 2022-07-31: qty 100

## 2022-07-31 MED ORDER — METHYLENE BLUE 1 % INJ SOLN
INTRAVENOUS | Status: AC
  Filled 2022-07-31: qty 10

## 2022-07-31 MED ORDER — FENTANYL CITRATE PF 50 MCG/ML IJ SOSY
25.0000 ug | PREFILLED_SYRINGE | INTRAMUSCULAR | Status: DC | PRN
  Administered 2022-07-31 (×2): 50 ug via INTRAVENOUS
  Filled 2022-07-31 (×2): qty 1

## 2022-07-31 MED ORDER — LACTATED RINGERS IV SOLN: INTRAVENOUS | Status: DC | PRN

## 2022-07-31 MED ORDER — CEFAZOLIN SODIUM-DEXTROSE 2-4 GM/100ML-% IV SOLN
2.0000 g | INTRAVENOUS | Status: AC
  Administered 2022-07-31: 2 g via INTRAVENOUS

## 2022-07-31 MED ORDER — BUPIVACAINE LIPOSOME 1.3 % IJ SUSP
INTRAMUSCULAR | Status: AC
  Filled 2022-07-31: qty 20

## 2022-07-31 MED ORDER — KETAMINE HCL 50 MG/5ML IJ SOSY
PREFILLED_SYRINGE | INTRAMUSCULAR | Status: AC
  Filled 2022-07-31: qty 5

## 2022-07-31 MED ORDER — PROPOFOL 10 MG/ML IV BOLUS
INTRAVENOUS | Status: AC
  Filled 2022-07-31: qty 20

## 2022-07-31 MED ORDER — OXYCODONE HCL 5 MG PO TABS: 5.0000 mg | ORAL_TABLET | ORAL | 0 refills | Status: DC | PRN

## 2022-07-31 MED ORDER — MIDAZOLAM HCL 2 MG/2ML IJ SOLN
INTRAMUSCULAR | Status: DC | PRN
  Administered 2022-07-31: 2 mg via INTRAVENOUS

## 2022-07-31 MED ORDER — MIDAZOLAM HCL 2 MG/2ML IJ SOLN
INTRAMUSCULAR | Status: AC
  Filled 2022-07-31: qty 2

## 2022-07-31 MED ORDER — DEXMEDETOMIDINE HCL IN NACL 80 MCG/20ML IV SOLN
INTRAVENOUS | Status: DC | PRN
  Administered 2022-07-31 (×2): 20 ug via BUCCAL

## 2022-07-31 MED ORDER — ONDANSETRON HCL 4 MG/2ML IJ SOLN
INTRAMUSCULAR | Status: DC | PRN
  Administered 2022-07-31: 4 mg via INTRAVENOUS

## 2022-07-31 MED ORDER — DEXAMETHASONE SODIUM PHOSPHATE 10 MG/ML IJ SOLN
INTRAMUSCULAR | Status: DC | PRN
  Administered 2022-07-31: 10 mg via INTRAVENOUS

## 2022-07-31 MED ORDER — FENTANYL CITRATE (PF) 250 MCG/5ML IJ SOLN
INTRAMUSCULAR | Status: DC | PRN
  Administered 2022-07-31 (×3): 100 ug via INTRAVENOUS

## 2022-07-31 MED ORDER — LIDOCAINE HCL (CARDIAC) PF 100 MG/5ML IV SOSY
PREFILLED_SYRINGE | INTRAVENOUS | Status: DC | PRN
  Administered 2022-07-31: 100 mg via INTRAVENOUS

## 2022-07-31 MED ORDER — HYDROGEN PEROXIDE 3 % EX SOLN
CUTANEOUS | Status: DC | PRN
  Administered 2022-07-31: 1

## 2022-07-31 MED ORDER — KETAMINE HCL 10 MG/ML IJ SOLN
INTRAMUSCULAR | Status: DC | PRN
  Administered 2022-07-31: 30 mg via INTRAVENOUS
  Administered 2022-07-31: 20 mg via INTRAVENOUS

## 2022-07-31 SURGICAL SUPPLY — 33 items
BAG HAMPER (MISCELLANEOUS) ×1 IMPLANT
CANNULA VESSEL 3MM 2 BLNT TIP (CANNULA) ×1 IMPLANT
CLOTH BEACON ORANGE TIMEOUT ST (SAFETY) ×1 IMPLANT
COVER LIGHT HANDLE STERIS (MISCELLANEOUS) ×2 IMPLANT
ELECT REM PT RETURN 9FT ADLT (ELECTROSURGICAL) ×1
ELECTRODE REM PT RTRN 9FT ADLT (ELECTROSURGICAL) ×1 IMPLANT
GAUZE 4X4 16PLY ~~LOC~~+RFID DBL (SPONGE) IMPLANT
GAUZE SPONGE 4X4 12PLY STRL (GAUZE/BANDAGES/DRESSINGS) ×1 IMPLANT
GLOVE BIO SURGEON STRL SZ 6.5 (GLOVE) ×1 IMPLANT
GLOVE BIOGEL PI IND STRL 6.5 (GLOVE) ×1 IMPLANT
GLOVE BIOGEL PI IND STRL 7.0 (GLOVE) ×2 IMPLANT
GOWN STRL REUS W/TWL LRG LVL3 (GOWN DISPOSABLE) ×2 IMPLANT
HEMOSTAT SURGICEL 4X8 (HEMOSTASIS) ×1 IMPLANT
KIT TURNOVER CYSTO (KITS) ×1 IMPLANT
LOOP VESSEL MAXI 1X406 BLUE (MISCELLANEOUS) ×1
LOOP VESSEL MAXI BLUE (MISCELLANEOUS) IMPLANT
LOOP VESSEL MINI RED (MISCELLANEOUS) IMPLANT
MANIFOLD NEPTUNE II (INSTRUMENTS) ×1 IMPLANT
NDL HYPO 18GX1.5 BLUNT FILL (NEEDLE) IMPLANT
NDL HYPO 21X1.5 SAFETY (NEEDLE) ×1 IMPLANT
NEEDLE HYPO 18GX1.5 BLUNT FILL (NEEDLE) ×1 IMPLANT
NEEDLE HYPO 21X1.5 SAFETY (NEEDLE) ×1 IMPLANT
NS IRRIG 1000ML POUR BTL (IV SOLUTION) ×1 IMPLANT
PACK PERI GYN (CUSTOM PROCEDURE TRAY) ×1 IMPLANT
PAD ABD 5X9 TENDERSORB (GAUZE/BANDAGES/DRESSINGS) IMPLANT
PAD ARMBOARD 7.5X6 YLW CONV (MISCELLANEOUS) ×1 IMPLANT
SET BASIN LINEN APH (SET/KITS/TRAYS/PACK) ×1 IMPLANT
SPONGE SURGIFOAM ABS GEL 100 (HEMOSTASIS) IMPLANT
SURGILUBE 2OZ TUBE FLIPTOP (MISCELLANEOUS) ×1 IMPLANT
SUT SILK 0 FSL (SUTURE) ×1 IMPLANT
SUT VIC AB 2-0 CT2 27 (SUTURE) IMPLANT
SYR 20ML LL LF (SYRINGE) ×2 IMPLANT
SYR CONTROL 10ML LL (SYRINGE) IMPLANT

## 2022-07-31 NOTE — Transfer of Care (Signed)
Immediate Anesthesia Transfer of Care Note  Patient: Rachel Sellers  Procedure(s) Performed: EXAM UNDER ANESTHESIA WITH SETON PLACEMENT (Rectum)  Patient Location: PACU  Anesthesia Type:General  Level of Consciousness: awake, alert , oriented, and patient cooperative  Airway & Oxygen Therapy: Patient Spontanous Breathing  Post-op Assessment: Report given to RN, Post -op Vital signs reviewed and stable, and Patient moving all extremities X 4  Post vital signs: Reviewed and stable  Last Vitals:  Vitals Value Taken Time  BP    Temp    Pulse    Resp    SpO2      Last Pain:  Vitals:   07/31/22 1257  PainSc: 0-No pain         Complications: No notable events documented.

## 2022-07-31 NOTE — Discharge Instructions (Signed)
Seton Placement/ Anal Fistula Discharge Instructions:  ° °What is the purpose of a seton placement? °Setons are used to treat anal fistulas. A fistula is an abnormal tunnel that °connects two structures. An anal fistula is an abnormal tunnel that forms °between the anus and the surrounding skin. The fistula’s internal opening is in °the anus and the external opening is typically on the skin around the anus or °on the buttocks. ° °Activity:  °Many individuals are able to return to work and resume routine activities the °day after their procedure. Some people require a week off from work if the °surgery is more extensive. Generally, within 1-2 weeks, surgical discomfort is °Minimal. ° °Care Instructions: °You can resume your normal diet once you have sufficiently recovered from °anesthesia. You should drink lots of liquid. Water is best. Try to drink at least °6-8 glasses of liquid daily. ° °Medications: °It is very important to prevent constipation after surgery. You should take a °stool softener, such as docusate sodium (Colace), twice a day for the first two °weeks. Also take a fiber supplement such as Benefiber, Citrucel, or Metamucil, °twice a day every day. Consult your pharmacist if you need help. If your stools °become loose, you can stop taking the softeners. ° °Take tylenol and ibuprofen as needed for pain control, alternating every 4-6 hours.  °Example:  °Tylenol 1000mg @ 6am, 12noon, 6pm, 12midnight (Do not exceed 4000mg of tylenol a day).  °Ibuprofen 800mg @ 9am, 3pm, 9pm, 3am (Do not exceed 3600mg of ibuprofen a day).  °Take Roxicodone for breakthrough pain every 4 hours.  °Drink plenty of water to also prevent constipation.  °Do not drive, operate machinery, or drink alcohol when taking narcotic pain medication.  °Within a few days, your pain should be sufficiently controlled with medications such as ibuprofen or acetaminophen. ° °Dressing: °You can replace your pad as needed after surgery. Feminine  pads work best.  °It is normal to see some bloody drainage on the dressing.  °Bleeding should not be heavy or continuous.  ° °Hygiene: °Keep the surgical area clean by taking Sitz baths (or tub baths) several times °per day. These are helpful for cleaning after each bowel movement. Frequent °showering is an alternative to baths, although many patients find baths °soothing after surgery. ° Equipment for Sitz baths can be purchased at many pharmacies or °medical/surgical supply stores. °o Use warm (not hot) water for cleansing. °o Pat the area dry or use a hairdryer to evaporate any residual moisture. °o If you use a hairdryer, use the cool or warm setting, to avoid potential °heat injury to the skin. ° Sitting on a pillow or ice pack may also provide relief. Do not apply ice °directly to the skin, use a towel or pillow case as a buffer. We do not °encourage sitting on an inflatable ring (“doughnut”) as this leads to °unopposed downward pressure in the anal area. ° You should gently rotate the Seton, every other day or so, to prevent it from °getting crusted with bodily fluids. ° Occasionally, the Seton may become displaced and fall out. If this occurs, it °is not an emergency. Please call our office the next business day so that we °may evaluate. ° °What symptoms should I expect? ° It is normal to have pain for up to 1-2 weeks. Thereafter, you may notice °discomfort with prolonged sitting and certain activities. Pain should not be °constant or worsening. ° ° Placement of Setons may stimulate mucus production so the volume of °drainage   you are having may increase at first. The volume of drainage °should lessen as healing occurs.  ° °

## 2022-07-31 NOTE — Progress Notes (Signed)
Report given to Dollene Cleveland RN

## 2022-07-31 NOTE — Interval H&P Note (Signed)
History and Physical Interval Note:  07/31/2022 1:51 PM  Rachel Sellers  has presented today for surgery, with the diagnosis of PERIANAL FISTULA.  The various methods of treatment have been discussed with the patient and family. After consideration of risks, benefits and other options for treatment, the patient has consented to  Procedure(s): ANAL FISTULOTOMY (N/A) as a surgical intervention.  The patient's history has been reviewed, patient examined, no change in status, stable for surgery.  I have reviewed the patient's chart and labs.  Questions were answered to the patient's satisfaction.     Lucretia Roers

## 2022-07-31 NOTE — Anesthesia Preprocedure Evaluation (Signed)
Anesthesia Evaluation  Patient identified by MRN, date of birth, ID band Patient awake    Reviewed: Allergy & Precautions, H&P , NPO status , Patient's Chart, lab work & pertinent test results, reviewed documented beta blocker date and time   Airway Mallampati: II  TM Distance: >3 FB Neck ROM: full    Dental no notable dental hx.    Pulmonary neg pulmonary ROS, Patient abstained from smoking., former smoker   Pulmonary exam normal breath sounds clear to auscultation       Cardiovascular Exercise Tolerance: Good negative cardio ROS  Rhythm:regular Rate:Normal     Neuro/Psych  PSYCHIATRIC DISORDERS Anxiety Depression Bipolar Disorder   negative neurological ROS     GI/Hepatic negative GI ROS, Neg liver ROS,,,  Endo/Other  negative endocrine ROS    Renal/GU Renal diseasenegative Renal ROS  negative genitourinary   Musculoskeletal   Abdominal   Peds  Hematology  (+) Blood dyscrasia, anemia   Anesthesia Other Findings   Reproductive/Obstetrics negative OB ROS                              Anesthesia Physical Anesthesia Plan  ASA: 2 and emergent  Anesthesia Plan: General and General LMA   Post-op Pain Management:    Induction:   PONV Risk Score and Plan: Ondansetron  Airway Management Planned:   Additional Equipment:   Intra-op Plan:   Post-operative Plan:   Informed Consent: I have reviewed the patients History and Physical, chart, labs and discussed the procedure including the risks, benefits and alternatives for the proposed anesthesia with the patient or authorized representative who has indicated his/her understanding and acceptance.     Dental Advisory Given  Plan Discussed with: CRNA  Anesthesia Plan Comments:          Anesthesia Quick Evaluation

## 2022-07-31 NOTE — Op Note (Addendum)
[facility] Operative Note  07/31/22  Preoperative Diagnosis: Perianal fistula    Postoperative Diagnosis: Trans-sphincteric fistula    Procedure(s) Performed: Exam under anesthesia, seton placement    Surgeon: Leatrice Jewels. Henreitta Leber, MD   Assistants: No qualified resident was available    Anesthesia: General endotracheal   Anesthesiologist: Dr. Johnnette Litter   Specimens: None    Estimated Blood Loss: Minimal   Blood Replacement: None    Complications: None   Wound Class: Dirty/ infected    Operative Indications: Rachel Sellers is a 31 yo who has had perianal abscesses and one twice on the left buttock in the past few years. We discussed that given this there is likely  a fistula that I did not find when I drained a posterior abscess, but that must connect to this left sided I&D site from several years ago. We discussed exam under anesthesia, risk of bleeding, infection, needing a seton if too much muscle involved, fistulotomy. Eventual referral to Colorectal if I have to place a seton due to muscle involvement.   Findings: Fistula going from left buttock I&D site to posterior midline, trans-sphincteric fistula   Procedure: The patient was taken to the operating room and placed supine. General endotracheal anesthesia was induced. Intravenous antibiotics were administered per protocol.  She was then placed in lithotomy with all pressure points padded. The perineal area and anal were were prepared and draped in the usual sterile fashion.   At the prior I&D site on the left buttock, I injected peroxide while an anoscopy was in place. I did not see anything obvious. I did the same thing with methylene blue and again nothing obvious. Using a blunt tip catheter for injecting peroxide, I felt for the track and then used a blunt tip fistula probe and found a track going to the posterior midline.  From here I opened up some of the old abscess cavity as the skin had dimpled in in this  area, and cleared out some of the fibrinous tissue.  I followed the fistula track with cautery down the probe, but did not complete a fistulotomy as I felt like there was too much muscle involved in the fistula.  I placed a blue vessel loop in to the fistula track and secured it in three places with a 0 silk.    The I&D cavity was cauterized and a gelfoam roll with viscous lidocaine was placed in her anus. Exparel was injected in the area.   Final inspection revealed acceptable hemostasis. All counts were correct at the end of the case. The patient was awakened from anesthesia and extubated without complication.  The patient went to the PACU in stable condition.   Algis Greenhouse, MD [facility] [address] [place] [place], [place] [address] [phone] (office)

## 2022-07-31 NOTE — Anesthesia Procedure Notes (Signed)
Procedure Name: LMA Insertion Date/Time: 07/31/2022 2:45 PM  Performed by: Shanon Payor, CRNAPre-anesthesia Checklist: Patient identified, Emergency Drugs available, Suction available, Patient being monitored and Timeout performed Patient Re-evaluated:Patient Re-evaluated prior to induction Oxygen Delivery Method: Circle system utilized Preoxygenation: Pre-oxygenation with 100% oxygen Induction Type: IV induction LMA: LMA inserted LMA Size: 4.0 Number of attempts: 1 Placement Confirmation: positive ETCO2, CO2 detector and breath sounds checked- equal and bilateral Tube secured with: Tape Dental Injury: Teeth and Oropharynx as per pre-operative assessment

## 2022-07-31 NOTE — Progress Notes (Signed)
[facility]  Patient's significant other updated. Seton placed due to concern too much muscle involved in the fistula.   Rx to pharmacy. Will see in 2 weeks. Lots of sitz baths.  Algis Greenhouse, MD [facility] [address] [place], [place] [address] [phone] (office)

## 2022-08-01 NOTE — Anesthesia Postprocedure Evaluation (Signed)
Anesthesia Post Note  Patient: Rachel Sellers  Procedure(s) Performed: EXAM UNDER ANESTHESIA WITH SETON PLACEMENT (Rectum)  Patient location during evaluation: Phase II Anesthesia Type: General Level of consciousness: awake Pain management: pain level controlled Vital Signs Assessment: post-procedure vital signs reviewed and stable Respiratory status: spontaneous breathing and respiratory function stable Cardiovascular status: blood pressure returned to baseline and stable Postop Assessment: no headache and no apparent nausea or vomiting Anesthetic complications: no Comments: Late entry   No notable events documented.   Last Vitals:  Vitals:   07/31/22 1644 07/31/22 1645  BP: 104/63 104/63  Pulse: 96 92  Resp: 13 17  Temp:    SpO2: 99% 99%    Last Pain:  Vitals:   07/31/22 1644  PainSc: 2                  Windell Norfolk

## 2022-08-11 ENCOUNTER — Ambulatory Visit (INDEPENDENT_AMBULATORY_CARE_PROVIDER_SITE_OTHER): Payer: BC Managed Care – PPO | Admitting: General Surgery

## 2022-08-11 ENCOUNTER — Encounter: Payer: Self-pay | Admitting: General Surgery

## 2022-08-11 VITALS — BP 118/82 | HR 108 | Temp 97.9°F | Resp 14 | Ht 63.0 in | Wt 195.0 lb

## 2022-08-11 DIAGNOSIS — K603 Anal fistula: Secondary | ICD-10-CM

## 2022-08-11 MED ORDER — OXYCODONE HCL 5 MG PO TABS: 5.0000 mg | ORAL_TABLET | ORAL | 0 refills | Status: DC | PRN

## 2022-08-11 NOTE — Progress Notes (Unsigned)
[facility]  Has twinges of pain and soreness. Bms can be painful. Spasm.  BP 118/82   Pulse (!) 108   Temp 97.9 F (36.6 C) (Oral)   Resp 14   Ht 5\' 3"  (1.6 m)   Wt 195 lb (88.5 kg)   LMP 07/22/2022 (Exact Date) Comment: HCG Negative 07/30/2022  SpO2 97%   BMI 34.54 kg/m    Abscess cavity size of clove of garlic draining, seton in place. Minor fibrinous material on edges.   Patient s/p opening up prior abscess cavity and seton placement for fistula. Doing fair.   Keep doing sitz baths.  Continue to move the seton.  Pain meds refilled.  May need to send to Colorectal in next 8 weeks. Will monitor.   Future Appointments  Date Time Provider Department Center  08/26/2022  2:45 PM Lucretia Roers, MD [facility] None  09/18/2022  2:30 PM Tommie Sams, DO [facility]    Algis Greenhouse, MD [facility] [address] [place], [place] [address] [phone] (office)

## 2022-08-11 NOTE — Patient Instructions (Signed)
Keep doing sitz baths.  Continue to move the seton.

## 2022-08-26 ENCOUNTER — Other Ambulatory Visit: Payer: Self-pay

## 2022-08-26 ENCOUNTER — Ambulatory Visit (INDEPENDENT_AMBULATORY_CARE_PROVIDER_SITE_OTHER): Payer: BC Managed Care – PPO | Admitting: General Surgery

## 2022-08-26 ENCOUNTER — Encounter: Payer: Self-pay | Admitting: General Surgery

## 2022-08-26 VITALS — BP 109/67 | HR 80 | Temp 97.5°F | Resp 18 | Ht 63.0 in | Wt 199.0 lb

## 2022-08-26 DIAGNOSIS — Z09 Encounter for follow-up examination after completed treatment for conditions other than malignant neoplasm: Secondary | ICD-10-CM

## 2022-08-26 DIAGNOSIS — K603 Anal fistula: Secondary | ICD-10-CM

## 2022-08-26 MED ORDER — OXYCODONE HCL 5 MG PO TABS: 5.0000 mg | ORAL_TABLET | Freq: Four times a day (QID) | ORAL | 0 refills | Status: DC | PRN

## 2022-08-26 NOTE — Progress Notes (Signed)
[facility]  Having drainage but soreness improving.  BP 109/67   Pulse 80   Temp (!) 97.5 F (36.4 C) (Oral)   Resp 18   Ht 5\' 3"  (1.6 m)   Wt 199 lb (90.3 kg)   LMP 07/22/2022 (Exact Date) Comment: HCG Negative 07/30/2022  SpO2 96%   BMI 35.25 kg/m  Area healing, seton in place  Patient s/p excision of prior abscess cavity and seton placement through fistula. Doing well.  Continue sitz baths.  Tylenol and ibuprofen as needed for pain. Narcotic for severe breakthrough pain.  Future Appointments  Date Time Provider Department Center  09/16/2022  3:00 PM 11/15/2022, MD [facility] None  09/18/2022  2:30 PM 11/17/2022, DO [facility]   Tommie Sams, MD [facility] [address] [place], [place] [phone] (office)

## 2022-08-26 NOTE — Patient Instructions (Signed)
Continue sitz baths.  Tylenol and ibuprofen as needed for pain. Narcotic for severe breakthrough pain.

## 2022-09-16 ENCOUNTER — Ambulatory Visit (INDEPENDENT_AMBULATORY_CARE_PROVIDER_SITE_OTHER): Payer: BC Managed Care – PPO | Admitting: General Surgery

## 2022-09-16 ENCOUNTER — Encounter: Payer: Self-pay | Admitting: General Surgery

## 2022-09-16 ENCOUNTER — Other Ambulatory Visit: Payer: Self-pay

## 2022-09-16 VITALS — BP 113/69 | HR 72 | Temp 98.3°F | Resp 16 | Ht 63.0 in | Wt 194.0 lb

## 2022-09-16 DIAGNOSIS — K603 Anal fistula: Secondary | ICD-10-CM

## 2022-09-16 NOTE — Patient Instructions (Signed)
Will refer you to Rachel Sellers for intersphincteric (in the muscle) perianal fistula. Continue sitz baths as needed.  Continue to move the seton around.  Keep stools regular and soft.

## 2022-09-18 ENCOUNTER — Ambulatory Visit: Payer: BC Managed Care – PPO | Admitting: Family Medicine

## 2022-09-18 ENCOUNTER — Other Ambulatory Visit: Payer: Self-pay | Admitting: *Deleted

## 2022-09-18 ENCOUNTER — Encounter: Payer: Self-pay | Admitting: Family Medicine

## 2022-09-18 DIAGNOSIS — F32A Depression, unspecified: Secondary | ICD-10-CM

## 2022-09-18 DIAGNOSIS — F419 Anxiety disorder, unspecified: Secondary | ICD-10-CM

## 2022-09-18 DIAGNOSIS — F5104 Psychophysiologic insomnia: Secondary | ICD-10-CM

## 2022-09-18 DIAGNOSIS — K603 Anal fistula: Secondary | ICD-10-CM

## 2022-09-18 MED ORDER — SERTRALINE HCL 50 MG PO TABS: 50.0000 mg | ORAL_TABLET | Freq: Every day | ORAL | 0 refills | Status: DC

## 2022-09-18 MED ORDER — TRAZODONE HCL 50 MG PO TABS: 50.0000 mg | ORAL_TABLET | Freq: Every evening | ORAL | 0 refills | Status: DC | PRN

## 2022-09-18 NOTE — Patient Instructions (Signed)
Medications as prescribed.  Follow up in 6 weeks.  Take care  Dr. Demarious Kapur  

## 2022-09-18 NOTE — Progress Notes (Signed)
[facility]  Patient doing well with the seton in place. No major issues and says she can barely feel it now. She is having regular Bms and minimal drainage.  BP 113/69   Pulse 72   Temp 98.3 F (36.8 C) (Oral)   Resp 16   Ht 5\' 3"  (1.6 m)   Wt 194 lb (88 kg)   SpO2 96%   BMI 34.37 kg/m  Seton intact, no induration, track healing, nontender   Patient s/p seton for transsphincteric fistula. Doing well and track is forming. She is about 2 months out now. I am going to refer her to Colorectal given the muscle involvement so they can discuss options for closing the fistula with her.   Referral to Leighton Ruff who does colorectal in [place] for help with fistula management after seton   If patient has appt with Dr. Marcello Moores before 2/6, she can cancel my appt. Otherwise, I will plan to check on her then.    Future Appointments  Date Time Provider Honor  09/18/2022  2:30 PM Ronald Pippins [facility]  10/20/2022 10:45 AM Virl Cagey, MD [facility] None    Curlene Labrum, MD [facility] [address] [place], [place] [address] [phone] (office)

## 2022-09-18 NOTE — Progress Notes (Unsigned)
Subjective:  Patient ID: Rachel Sellers, female    DOB: 11-13-1990  Age: 32 y.o. MRN: [id number]  CC: Establish care.  HPI [place] is a 32 y.o. female presents to the clinic today to establish care.  Issues/concerns or below.  Patient reports ongoing issues with depression, anxiety, social issues. She states that she works from home. Does not like leaving the house. Doesn't like being around others and social situations. This has been slowly worsening. She feels very anxious (GAD 7 score of 21). She states that she has been trying to manage this but she can't. She has been on medications in the past. Has difficulty sleeping. Will discuss treatment options today.   PMH, Surgical Hx, Family Hx, Social History reviewed and updated as below.  Past medical history: Rona Ravens sphincteric anal fistula, buttock abscess, depression and anxiety, ?  Bipolar disorder.  Past Surgical History:  Procedure Laterality Date   ANAL FISTULOTOMY N/A 07/31/2022   Procedure: EXAM UNDER ANESTHESIA WITH SETON PLACEMENT;  Surgeon: Virl Cagey, MD;  Location: [facility];  Service: General;  Laterality: N/A;   CERVICAL CONIZATION W/BX Left 08/23/2019   Procedure: LASER ABLATION OF CERVIX;  Surgeon: Florian Buff, MD;  Location: [facility];  Service: Gynecology;  Laterality: Left;   INCISION AND DRAINAGE ABSCESS Left 10/30/2015   Procedure: INCISION AND DRAINAGE PERI-RECTAL ABSCESS;  Surgeon: Aviva Signs, MD;  Location: [facility];  Service: General;  Laterality: Left;   INCISION AND DRAINAGE PERIRECTAL ABSCESS N/A 06/05/2022   Procedure: IRRIGATION AND DEBRIDEMENT PERIRECTAL ABSCESS; PENROSE DRAIN PLACED;  Surgeon: Virl Cagey, MD;  Location: [facility];  Service: General;  Laterality: N/A;   MULTIPLE TOOTH EXTRACTIONS  05/22/2022   Family History  Problem Relation Age of Onset   Depression Mother    Anxiety disorder Mother    Depression Sister    Social History   Tobacco Use   Smoking status: Former     Packs/day: 1.00    Years: 13.00    Total pack years: 13.00    Types: Cigarettes    Quit date: 06/15/2022    Years since quitting: 0.2   Smokeless tobacco: Never  Substance Use Topics   Alcohol use: Not Currently    Comment: occassionally    Review of Systems Per HPI  Objective:   Today's Vitals: BP 120/80   Pulse 91   Temp 98.6 F (37 C)   Ht 5\' 3"  (1.6 m)   Wt 194 lb 6.4 oz (88.2 kg)   SpO2 98%   BMI 34.44 kg/m   Physical Exam Vitals and nursing note reviewed.  Constitutional:      General: She is not in acute distress.    Appearance: Normal appearance.  HENT:     Head: Normocephalic and atraumatic.  Eyes:     General:        Right eye: No discharge.        Left eye: No discharge.     Conjunctiva/sclera: Conjunctivae normal.  Cardiovascular:     Rate and Rhythm: Normal rate and regular rhythm.  Pulmonary:     Effort: Pulmonary effort is normal.     Breath sounds: Normal breath sounds. No wheezing, rhonchi or rales.  Neurological:     Mental Status: She is alert.  Psychiatric:     Comments: Flat affect. Anxious.     Assessment & Plan:   Problem List Items Addressed This Visit       Other  Insomnia    Treating with Trazodone.       Anxiety and depression - Primary    Uncontrolled/worsening. Starting on Zoloft.       Relevant Medications   sertraline (ZOLOFT) 50 MG tablet   traZODone (DESYREL) 50 MG tablet    Meds ordered this encounter  Medications   sertraline (ZOLOFT) 50 MG tablet    Sig: Take 1 tablet (50 mg total) by mouth daily. After 1 week, increase to 100 mg daily (2 tablets).    Dispense:  90 tablet    Refill:  0   traZODone (DESYREL) 50 MG tablet    Sig: Take 1 tablet (50 mg total) by mouth at bedtime as needed for sleep.    Dispense:  90 tablet    Refill:  0     Follow-up: Return in about 6 weeks (around 10/30/2022).  [place]

## 2022-09-20 DIAGNOSIS — G4700 Insomnia, unspecified: Secondary | ICD-10-CM | POA: Insufficient documentation

## 2022-09-20 DIAGNOSIS — F32A Depression, unspecified: Secondary | ICD-10-CM | POA: Insufficient documentation

## 2022-09-20 NOTE — Assessment & Plan Note (Signed)
Uncontrolled/worsening. Starting on Zoloft.

## 2022-09-20 NOTE — Assessment & Plan Note (Signed)
Treating with Trazodone.  

## 2022-10-05 ENCOUNTER — Ambulatory Visit: Payer: Self-pay | Admitting: General Surgery

## 2022-10-05 NOTE — H&P (Signed)
REFERRING PHYSICIAN:  Virl Cagey, MD   PROVIDER:  Monico Blitz, MD   MRN: [id number] DOB: Aug 31, 1991 DATE OF ENCOUNTER: 10/05/2022   Subjective    Chief Complaint: New Consultation (Transsphincter Fistula )       History of Present Illness: Rachel Sellers is a 32 y.o. female who is seen today as an office consultation at the request of Dr. Constance Haw for evaluation of an anal fistula.  She was noticed to have recurrent perianal abscesses and a concern for fistula was raised.  She was taken to the operating room in mid November 2023 for exam under anesthesia.  [name] was placed due to significant sphincter involvement.  She is here today for further evaluation.       Review of Systems: A complete review of systems was obtained from the patient.  I have reviewed this information and discussed as appropriate with the patient.  See HPI as well for other ROS.     Medical History: Past Medical History      Past Medical History:  Diagnosis Date   Anxiety          There is no problem list on file for this patient.     Past Surgical History       Past Surgical History:  Procedure Laterality Date   Anal Fistula Surgery    07/31/2022        Allergies       Allergies  Allergen Reactions   Gentamicin Other (See Comments)      Renal issues.   Vancomycin Other (See Comments)      Kidney failure              Current Outpatient Medications on File Prior to Visit  Medication Sig Dispense Refill   sertraline (ZOLOFT) 50 MG tablet Take by mouth       traZODone (DESYREL) 50 MG tablet Take 50 mg by mouth at bedtime as needed        No current facility-administered medications on file prior to visit.      Family History  History reviewed. No pertinent family history.      Social History        Tobacco Use  Smoking Status Every Day   Types: Cigarettes  Smokeless Tobacco Never      Social History  Social History         Socioeconomic History    Marital status: Single  Tobacco Use   Smoking status: Every Day      Types: Cigarettes   Smokeless tobacco: Never  Substance and Sexual Activity   Alcohol use: Not Currently   Drug use: Never        Objective:         Vitals:    10/05/22 1026  BP: 112/75  Pulse: 77  Temp: 36.1 C (97 F)  SpO2: 97%  Weight: 87.5 kg (193 lb)  Height: 160 cm (5\' 3" )      Exam Gen: NAD Abd: soft Rectal: Seton in place left posterior anal canal, fistulotomy external site healing appropriately     Labs, Imaging and Diagnostic Testing:     Assessment and Plan:  There are no diagnoses linked to this encounter.    32 year old female with transsphincteric anal fistula status post seton placement in November.  We discussed performing a lift procedure.  We discussed the typical postoperative pain and recovery time.  We discussed that if she stop smoking, her recurrence  rate would be approximately 20%.  It will be much higher if she continues to smoke after surgery.  She is willing to quit smoking for this.  We have discussed this in detail.  Other risks include bleeding and scarring.  All questions answered.   No follow-ups on file.     Rosario Adie, MD Colon and Rectal Surgery [facility]

## 2022-10-09 ENCOUNTER — Ambulatory Visit (INDEPENDENT_AMBULATORY_CARE_PROVIDER_SITE_OTHER): Payer: BC Managed Care – PPO | Admitting: Nurse Practitioner

## 2022-10-09 VITALS — BP 111/77 | HR 98 | Temp 98.6°F | Ht 63.0 in | Wt 194.0 lb

## 2022-10-09 DIAGNOSIS — Z124 Encounter for screening for malignant neoplasm of cervix: Secondary | ICD-10-CM

## 2022-10-09 DIAGNOSIS — F32A Depression, unspecified: Secondary | ICD-10-CM

## 2022-10-09 DIAGNOSIS — Z1151 Encounter for screening for human papillomavirus (HPV): Secondary | ICD-10-CM

## 2022-10-09 DIAGNOSIS — Z01419 Encounter for gynecological examination (general) (routine) without abnormal findings: Secondary | ICD-10-CM

## 2022-10-09 DIAGNOSIS — Z23 Encounter for immunization: Secondary | ICD-10-CM

## 2022-10-09 DIAGNOSIS — Z13220 Encounter for screening for lipoid disorders: Secondary | ICD-10-CM

## 2022-10-09 DIAGNOSIS — F419 Anxiety disorder, unspecified: Secondary | ICD-10-CM

## 2022-10-09 NOTE — Progress Notes (Unsigned)
Subjective:    Patient ID: Rachel Sellers, female    DOB: [birth year], 32 y.o.   MRN: [id number]  HPI  The patient comes in today for a wellness visit.    A review of their health history was completed.  A review of medications was also completed.  Any needed refills; no  Eating habits: fair  Falls/  MVA accidents in past few months: no  Regular exercise: no  Specialist pt sees on regular basis: no  Preventative health issues were discussed.   Additional concerns: last pap in 2020 positive w/ colpo 2021; had a laser ablation of the cervix at that time.  Is due for her Pap smear today. Same sexual female partner for the past 15 years.  Defers STD testing.  Regular cycles with slightly heavy flow lasting 3 days.  Patient is not currently on contraceptives and is fine if pregnancy occurs.  Has reduced her smoking to half pack per day.  Would like to quit due to her wishes to become pregnant.  Does not vape.  Rare use of marijuana.  No alcohol use.  No drug use.  Limited activity during the day since she works from home.  States she does have an active lifestyle.  Denies any visual issues.  Had to have her teeth removed and dentures placed.  Patient has been experiencing self-confidence issues and anxiety related to this.  Was seen by Dr. Lacinda Axon on 1/5 and started on sertraline and trazodone.  States she is starting to see some improvement.  Denies any significant adverse effects.  Has follow-up appointment as scheduled with him. Recent anal fistula repair.  Review of Systems  Constitutional:  Negative for activity change, appetite change and fatigue.  HENT:  Negative for mouth sores, sore throat and trouble swallowing.   Respiratory:  Negative for cough, chest tightness, shortness of breath and wheezing.   Cardiovascular:  Negative for chest pain.  Gastrointestinal:  Negative for abdominal distention, abdominal pain, constipation, diarrhea, nausea and vomiting.  Genitourinary:   Negative for difficulty urinating, dysuria, enuresis, frequency, genital sores, menstrual problem, pelvic pain, urgency and vaginal discharge.      10/09/2022    1:20 PM  Depression screen PHQ 2/9  Decreased Interest 2  Down, Depressed, Hopeless 1  PHQ - 2 Score 3  Altered sleeping 1  Tired, decreased energy 1  Change in appetite 1  Feeling bad or failure about yourself  1  Trouble concentrating 3  Moving slowly or fidgety/restless 3  Suicidal thoughts 0  PHQ-9 Score 13  Difficult doing work/chores Very difficult      10/09/2022    1:21 PM 09/18/2022    2:33 PM  GAD 7 : Generalized Anxiety Score  Nervous, Anxious, on Edge 3 3  Control/stop worrying 3 3  Worry too much - different things 3 3  Trouble relaxing 3 3  Restless 3 3  Easily annoyed or irritable 3 3  Afraid - awful might happen 3 3  Total GAD 7 Score 21 21  Anxiety Difficulty Extremely difficult Extremely difficult         Objective:   Physical Exam Vitals and nursing note reviewed.  Constitutional:      General: She is not in acute distress.    Appearance: She is well-developed.  Neck:     Thyroid: No thyromegaly.     Trachea: No tracheal deviation.     Comments: Thyroid non tender to palpation. No mass or goiter noted.  Cardiovascular:  Rate and Rhythm: Normal rate and regular rhythm.     Heart sounds: Normal heart sounds. No murmur heard. Pulmonary:     Effort: Pulmonary effort is normal.     Breath sounds: Normal breath sounds.  Chest:  Breasts:    Right: No swelling, inverted nipple, mass, skin change or tenderness.     Left: No swelling, inverted nipple, mass, skin change or tenderness.  Abdominal:     General: There is no distension.     Palpations: Abdomen is soft.     Tenderness: There is no abdominal tenderness.  Genitourinary:    Comments: External GU no rashes lesions or discoloration.  Vagina pink with minimal white mucoid discharge.  Cervix normal in appearance, no lesions or  erythema.  Pap smear obtained.  No CMT.  Bimanual exam no tenderness or obvious masses. Musculoskeletal:     Cervical back: Normal range of motion and neck supple.  Lymphadenopathy:     Cervical: No cervical adenopathy.     Upper Body:     Right upper body: No supraclavicular, axillary or pectoral adenopathy.     Left upper body: No supraclavicular, axillary or pectoral adenopathy.  Skin:    General: Skin is warm and dry.     Findings: No rash.  Neurological:     Mental Status: She is alert and oriented to person, place, and time.  Psychiatric:        Mood and Affect: Mood normal.        Behavior: Behavior normal.        Thought Content: Thought content normal.        Judgment: Judgment normal.    Today's Vitals   10/09/22 1308  BP: 111/77  Pulse: 98  Temp: 98.6 F (37 C)  SpO2: 96%  Weight: 194 lb (88 kg)  Height: 5\' 3"  (1.6 m)   Body mass index is 34.37 kg/m.         Assessment & Plan:   Problem List Items Addressed This Visit       Other   Anxiety and depression   Relevant Orders   TSH   Other Visit Diagnoses     Well woman exam    -  Primary   Relevant Orders   IGP, Aptima HPV   CBC with Differential   Comprehensive metabolic panel   Lipid Panel   TSH   Tdap vaccine greater than or equal to 7yo IM (Completed)   Screening for cervical cancer       Relevant Orders   IGP, Aptima HPV   Screening for HPV (human papillomavirus)       Relevant Orders   IGP, Aptima HPV   Immunization due       Relevant Orders   Tdap vaccine greater than or equal to 7yo IM (Completed)   Screening for lipid disorders       Relevant Orders   Lipid Panel      Lab work ordered. Pap smear obtained. Encouraged healthy diet and regular activity.  Continue to work on smoking cessation.  Patient to notify office if she would like to try medication such as Chantix to help with this.  Feel she will be more likely to be successful once her anxiety and depression symptoms have  improved. Continue sertraline as directed.  Follow-up with Dr. Lacinda Axon as planned. Return in about 1 year (around 10/10/2023) for physical.

## 2022-10-10 ENCOUNTER — Encounter: Payer: Self-pay | Admitting: Nurse Practitioner

## 2022-10-15 LAB — IGP, APTIMA HPV: HPV Aptima: POSITIVE — AB

## 2022-10-20 ENCOUNTER — Encounter: Payer: BC Managed Care – PPO | Admitting: General Surgery

## 2022-10-30 ENCOUNTER — Ambulatory Visit: Payer: BC Managed Care – PPO | Admitting: Family Medicine

## 2022-11-07 ENCOUNTER — Emergency Department (HOSPITAL_COMMUNITY)
Admission: EM | Admit: 2022-11-07 | Discharge: 2022-11-07 | Disposition: A | Discharge status: Home / Self Care | Payer: BC Managed Care – PPO | Source: Home / Self Care | Attending: Emergency Medicine | Admitting: Emergency Medicine

## 2022-11-07 ENCOUNTER — Encounter (HOSPITAL_COMMUNITY): Payer: Self-pay | Admitting: *Deleted

## 2022-11-07 ENCOUNTER — Other Ambulatory Visit: Payer: Self-pay

## 2022-11-07 DIAGNOSIS — M545 Low back pain, unspecified: Secondary | ICD-10-CM

## 2022-11-07 DIAGNOSIS — K610 Anal abscess: Secondary | ICD-10-CM | POA: Diagnosis not present

## 2022-11-07 DIAGNOSIS — A419 Sepsis, unspecified organism: Secondary | ICD-10-CM | POA: Diagnosis not present

## 2022-11-07 MED ORDER — CYCLOBENZAPRINE HCL 5 MG PO TABS: 5.0000 mg | ORAL_TABLET | Freq: Two times a day (BID) | ORAL | 0 refills | Status: DC | PRN

## 2022-11-07 MED ORDER — LIDOCAINE 5 % EX PTCH
1.0000 | MEDICATED_PATCH | CUTANEOUS | Status: DC
  Administered 2022-11-07: 1 via TRANSDERMAL
  Filled 2022-11-07: qty 1

## 2022-11-07 MED ORDER — KETOROLAC TROMETHAMINE 30 MG/ML IJ SOLN
30.0000 mg | Freq: Once | INTRAMUSCULAR | Status: AC
  Administered 2022-11-07: 30 mg via INTRAMUSCULAR
  Filled 2022-11-07: qty 1

## 2022-11-07 MED ORDER — KETOROLAC TROMETHAMINE 10 MG PO TABS: 10.0000 mg | ORAL_TABLET | Freq: Four times a day (QID) | ORAL | 0 refills | Status: DC | PRN

## 2022-11-07 MED ORDER — LIDOCAINE 4 % EX PTCH: 1.0000 | MEDICATED_PATCH | CUTANEOUS | 0 refills | Status: DC

## 2022-11-07 NOTE — Discharge Instructions (Addendum)
Please follow-up with your primary care doctor, any nausea chiropractor as well to help with the back pain.  If you develop severe numbness tingling in both legs, loss of bowel or bladder, please return to the ER.  Take the muscle relaxers, at nighttime to prevent any kind of sleepiness during the day.

## 2022-11-07 NOTE — ED Triage Notes (Signed)
Pt c/o pain to top of gluteal fold x 4 days. Denies injury. Pt reports she has taken Ibuprofen, Tylenol, pain pill from a friend with no relief in pain. Pt reports she has poor posture and sits a lot and feels it may be due to that. Pt reports leaning over relieves the pain for just a second, but sitting makes it worse.

## 2022-11-07 NOTE — ED Provider Notes (Signed)
[facility] Provider Note   CSN: [id number] Arrival date & time: 11/07/22  1001     History  Chief Complaint  Patient presents with   Tailbone Pain    Rachel Sellers is a 32 y.o. female, no pertinent past medical history, who presents to the ED secondary to low midline back pain that is been going on for the last couple days.  She states that she has bad posture and sits at desk, often crosses her legs, and when she noticed 1 day, that she was having some low back pain, she states this is gotten progressively worse, and so much so that she cannot sleep at night.  She is currently menstruating.  She denies any kind of radiation to bilateral legs, loss of bowel or bladder.  Fevers or chills.  She is not an IV drug user.  Has tried Tylenol and ibuprofen without relief and even a friend's pain pill.  She states it is pressurized, dull and aching at times, but then it can be sharp and stabbing at times.  Worse when going from sitting to standing or standing to sitting.  Denies any trauma.      Home Medications Prior to Admission medications   Medication Sig Start Date End Date Taking? Authorizing Provider  cyclobenzaprine (FLEXERIL) 5 MG tablet Take 1 tablet (5 mg total) by mouth 2 (two) times daily as needed for muscle spasms. 11/07/22  Yes Andi Mahaffy L, PA  ketorolac (TORADOL) 10 MG tablet Take 1 tablet (10 mg total) by mouth every 6 (six) hours as needed (do not take other NSAIDs with this medication). 11/07/22  Yes Merelin Human L, PA  lidocaine (HM LIDOCAINE PATCH) 4 % Place 1 patch onto the skin daily. 11/07/22  Yes Yiselle Babich L, PA  sertraline (ZOLOFT) 50 MG tablet Take 1 tablet (50 mg total) by mouth daily. After 1 week, increase to 100 mg daily (2 tablets). 09/18/22   Coral Spikes, DO  traZODone (DESYREL) 50 MG tablet Take 1 tablet (50 mg total) by mouth at bedtime as needed for sleep. 09/18/22   Coral Spikes, DO      Allergies     Gentamicin and Vancomycin    Review of Systems   Review of Systems  Musculoskeletal:  Positive for back pain. Negative for gait problem.    Physical Exam Updated Vital Signs BP (!) 127/97 (BP Location: Right Arm)   Pulse (!) 108   Temp 97.9 F (36.6 C) (Oral)   Resp 20   Ht '5\' 3"'$  (1.6 m)   Wt 88.5 kg   LMP 11/05/2022 (Exact Date)   SpO2 98%   BMI 34.54 kg/m  Physical Exam Constitutional:      General: She is not in acute distress.    Appearance: She is well-developed. She is not toxic-appearing.  HENT:     Head: Normocephalic and atraumatic.  Abdominal:     General: There is no distension.     Palpations: Abdomen is soft.     Tenderness: There is no abdominal tenderness.  Musculoskeletal:     Cervical back: Normal range of motion and neck supple. No spinous process tenderness or muscular tenderness.     Comments: No obvious deformity, appreciable swelling, erythema, ecchymosis, significant open wounds, or increased warmth.  Back: TTP along sacrum. No SI joint ttp. No lumbar or thoracic midline ttp or paraspinal ttp. No palpable step off or crepitus.  Lower extremities: ranging @  all major joints. No focal bony tenderness   Skin:    General: Skin is warm and dry.     Findings: No rash.  Neurological:     Mental Status: She is alert.     Comments: Sensation grossly intact to bilateral lower extremities. 5/5 symmetric strength with plantar/dorsiflexion bilaterally. Gait is intact without obvious foot drop. Negative SLR BLE     ED Results / Procedures / Treatments   Labs (all labs ordered are listed, but only abnormal results are displayed) Labs Reviewed - No data to display  EKG None  Radiology No results found.  Procedures Procedures    Medications Ordered in ED Medications  ketorolac (TORADOL) 30 MG/ML injection 30 mg (has no administration in time range)  lidocaine (LIDODERM) 5 % 1 patch (has no administration in time range)    ED Course/ Medical  Decision Making/ A&P                             Medical Decision Making Patient is a 32 year old female, here for low back pain that is been going on for the last couple days, endorses pain that is worse from standing to sitting or sitting to standing, it is along her sacrum, denies any trauma.  She does have tenderness to palpation around her sacrum, and has no neurodeficits.  No trauma thus no imaging indicated.  No red flag symptoms.  I believe that her pain is likely secondary to possible SI joint joint dysfunction causing sacral pain.  We discussed the importance of following up with primary care doctor and possible chiropractor.  Toradol, Flexeril, lidocaine patches sent to the pharmacy.  Return precautions emphasized.  No rash noted at the site.  No signs of a pilonidal cyst.  Risk Prescription drug management.   Final Clinical Impression(s) / ED Diagnoses Final diagnoses:  Acute midline low back pain without sciatica    Rx / DC Orders ED Discharge Orders          Ordered    ketorolac (TORADOL) 10 MG tablet  Every 6 hours PRN        11/07/22 1155    lidocaine (HM LIDOCAINE PATCH) 4 %  Every 24 hours        11/07/22 1155    cyclobenzaprine (FLEXERIL) 5 MG tablet  2 times daily PRN        11/07/22 1155              Sandar Krinke, [place], [place] 11/07/22 1159    Milton Ferguson, MD 11/10/22 1518

## 2022-11-09 ENCOUNTER — Encounter (HOSPITAL_COMMUNITY)
Admission: EM | Disposition: A | Discharge status: Home / Self Care | Payer: Self-pay | Source: Home / Self Care | Attending: Internal Medicine

## 2022-11-09 ENCOUNTER — Other Ambulatory Visit: Payer: Self-pay

## 2022-11-09 ENCOUNTER — Emergency Department (HOSPITAL_COMMUNITY): Payer: BC Managed Care – PPO

## 2022-11-09 ENCOUNTER — Inpatient Hospital Stay (HOSPITAL_COMMUNITY): Payer: BC Managed Care – PPO | Admitting: Anesthesiology

## 2022-11-09 ENCOUNTER — Telehealth: Payer: Self-pay

## 2022-11-09 ENCOUNTER — Inpatient Hospital Stay (HOSPITAL_COMMUNITY)
Admission: EM | Admit: 2022-11-09 | Discharge: 2022-11-11 | DRG: 854 | Disposition: A | Discharge status: Home Health | Payer: BC Managed Care – PPO | Attending: Internal Medicine | Admitting: Internal Medicine

## 2022-11-09 DIAGNOSIS — Z888 Allergy status to other drugs, medicaments and biological substances status: Secondary | ICD-10-CM | POA: Diagnosis not present

## 2022-11-09 DIAGNOSIS — R6510 Systemic inflammatory response syndrome (SIRS) of non-infectious origin without acute organ dysfunction: Secondary | ICD-10-CM | POA: Insufficient documentation

## 2022-11-09 DIAGNOSIS — K610 Anal abscess: Secondary | ICD-10-CM

## 2022-11-09 DIAGNOSIS — M869 Osteomyelitis, unspecified: Secondary | ICD-10-CM | POA: Diagnosis present

## 2022-11-09 DIAGNOSIS — Z6834 Body mass index (BMI) 34.0-34.9, adult: Secondary | ICD-10-CM | POA: Diagnosis not present

## 2022-11-09 DIAGNOSIS — F419 Anxiety disorder, unspecified: Secondary | ICD-10-CM | POA: Diagnosis present

## 2022-11-09 DIAGNOSIS — F17210 Nicotine dependence, cigarettes, uncomplicated: Secondary | ICD-10-CM | POA: Diagnosis present

## 2022-11-09 DIAGNOSIS — F32A Depression, unspecified: Secondary | ICD-10-CM | POA: Diagnosis present

## 2022-11-09 DIAGNOSIS — N390 Urinary tract infection, site not specified: Secondary | ICD-10-CM | POA: Diagnosis present

## 2022-11-09 DIAGNOSIS — A419 Sepsis, unspecified organism: Principal | ICD-10-CM | POA: Diagnosis present

## 2022-11-09 DIAGNOSIS — E876 Hypokalemia: Secondary | ICD-10-CM | POA: Diagnosis present

## 2022-11-09 DIAGNOSIS — N3000 Acute cystitis without hematuria: Secondary | ICD-10-CM

## 2022-11-09 DIAGNOSIS — K612 Anorectal abscess: Secondary | ICD-10-CM | POA: Diagnosis present

## 2022-11-09 DIAGNOSIS — M4628 Osteomyelitis of vertebra, sacral and sacrococcygeal region: Secondary | ICD-10-CM

## 2022-11-09 DIAGNOSIS — Z881 Allergy status to other antibiotic agents status: Secondary | ICD-10-CM | POA: Diagnosis not present

## 2022-11-09 DIAGNOSIS — K611 Rectal abscess: Secondary | ICD-10-CM | POA: Diagnosis not present

## 2022-11-09 DIAGNOSIS — Z79899 Other long term (current) drug therapy: Secondary | ICD-10-CM

## 2022-11-09 DIAGNOSIS — Z818 Family history of other mental and behavioral disorders: Secondary | ICD-10-CM | POA: Diagnosis not present

## 2022-11-09 HISTORY — PX: INCISION AND DRAINAGE ABSCESS: SHX5864

## 2022-11-09 LAB — COMPREHENSIVE METABOLIC PANEL
ALT: 17 U/L (ref 0–44)
AST: 18 U/L (ref 15–41)
Albumin: 3.6 g/dL (ref 3.5–5.0)
Alkaline Phosphatase: 55 U/L (ref 38–126)
Anion gap: 13 (ref 5–15)
BUN: 16 mg/dL (ref 6–20)
CO2: 23 mmol/L (ref 22–32)
Calcium: 8.4 mg/dL — ABNORMAL LOW (ref 8.9–10.3)
Chloride: 101 mmol/L (ref 98–111)
Creatinine, Ser: 0.75 mg/dL (ref 0.44–1.00)
GFR, Estimated: >60 mL/min (ref >60)
Glucose, Bld: 100 mg/dL — ABNORMAL HIGH (ref 70–99)
Potassium: 3.1 mmol/L — ABNORMAL LOW (ref 3.5–5.1)
Sodium: 137 mmol/L (ref 135–145)
Total Bilirubin: 0.6 mg/dL (ref 0.3–1.2)
Total Protein: 7.5 g/dL (ref 6.5–8.1)

## 2022-11-09 LAB — URINALYSIS, ROUTINE W REFLEX MICROSCOPIC
Bilirubin Urine: NEGATIVE
Glucose, UA: NEGATIVE mg/dL
Ketones, ur: 20 mg/dL — AB
Nitrite: NEGATIVE
Protein, ur: 100 mg/dL — AB
Specific Gravity, Urine: 1.026 (ref 1.005–1.030)
pH: 6 (ref 5.0–8.0)

## 2022-11-09 LAB — CBC WITH DIFFERENTIAL/PLATELET
Abs Immature Granulocytes: 0.16 10*3/uL — ABNORMAL HIGH (ref 0.00–0.07)
Basophils Absolute: 0 10*3/uL (ref 0.0–0.1)
Basophils Relative: 0 %
Eosinophils Absolute: 0 10*3/uL (ref 0.0–0.5)
Eosinophils Relative: 0 %
HCT: 38.3 % (ref 36.0–46.0)
Hemoglobin: 12.9 g/dL (ref 12.0–15.0)
Immature Granulocytes: 1 %
Lymphocytes Relative: 10 %
Lymphs Abs: 2 10*3/uL (ref 0.7–4.0)
MCH: 29.9 pg (ref 26.0–34.0)
MCHC: 33.7 g/dL (ref 30.0–36.0)
MCV: 88.7 fL (ref 80.0–100.0)
Monocytes Absolute: 1.5 10*3/uL — ABNORMAL HIGH (ref 0.1–1.0)
Monocytes Relative: 7 %
Neutro Abs: 16.7 10*3/uL — ABNORMAL HIGH (ref 1.7–7.7)
Neutrophils Relative %: 82 %
Platelets: 289 10*3/uL (ref 150–400)
RBC: 4.32 MIL/uL (ref 3.87–5.11)
RDW: 13.2 % (ref 11.5–15.5)
WBC: 20.4 10*3/uL — ABNORMAL HIGH (ref 4.0–10.5)
nRBC: 0 % (ref 0.0–0.2)

## 2022-11-09 LAB — HCG, SERUM, QUALITATIVE: Preg, Serum: NEGATIVE

## 2022-11-09 LAB — LACTIC ACID, PLASMA
Lactic Acid, Venous: 1.3 mmol/L (ref 0.5–1.9)
Lactic Acid, Venous: 1.3 mmol/L (ref 0.5–1.9)

## 2022-11-09 SURGERY — INCISION AND DRAINAGE, ABSCESS
Anesthesia: General

## 2022-11-09 MED ORDER — FENTANYL CITRATE (PF) 100 MCG/2ML IJ SOLN
INTRAMUSCULAR | Status: DC | PRN
  Administered 2022-11-09 (×4): 50 ug via INTRAVENOUS
  Administered 2022-11-09 (×2): 100 ug via INTRAVENOUS

## 2022-11-09 MED ORDER — ONDANSETRON HCL 4 MG PO TABS: 4.0000 mg | ORAL_TABLET | Freq: Four times a day (QID) | ORAL | Status: DC | PRN

## 2022-11-09 MED ORDER — ONDANSETRON HCL 4 MG/2ML IJ SOLN: 4.0000 mg | Freq: Once | INTRAMUSCULAR | Status: DC | PRN

## 2022-11-09 MED ORDER — MUPIROCIN 2 % EX OINT
1.0000 | TOPICAL_OINTMENT | Freq: Two times a day (BID) | CUTANEOUS | Status: DC
  Administered 2022-11-09 – 2022-11-10 (×3): 1 via NASAL
  Filled 2022-11-09: qty 22

## 2022-11-09 MED ORDER — SODIUM CHLORIDE 0.9 % IR SOLN
Status: DC | PRN
  Administered 2022-11-09: 1000 mL

## 2022-11-09 MED ORDER — HYDROMORPHONE HCL 1 MG/ML IJ SOLN
1.0000 mg | Freq: Once | INTRAMUSCULAR | Status: AC
  Administered 2022-11-09: 1 mg via INTRAVENOUS
  Filled 2022-11-09: qty 1

## 2022-11-09 MED ORDER — LIDOCAINE HCL (PF) 2 % IJ SOLN
INTRAMUSCULAR | Status: AC
  Filled 2022-11-09: qty 5

## 2022-11-09 MED ORDER — ONDANSETRON HCL 4 MG/2ML IJ SOLN
INTRAMUSCULAR | Status: AC
  Filled 2022-11-09: qty 2

## 2022-11-09 MED ORDER — FENTANYL CITRATE (PF) 100 MCG/2ML IJ SOLN
INTRAMUSCULAR | Status: AC
  Filled 2022-11-09: qty 2

## 2022-11-09 MED ORDER — ALBUTEROL SULFATE HFA 108 (90 BASE) MCG/ACT IN AERS
INHALATION_SPRAY | RESPIRATORY_TRACT | Status: DC | PRN
  Administered 2022-11-09: 3 via RESPIRATORY_TRACT

## 2022-11-09 MED ORDER — PROPOFOL 10 MG/ML IV BOLUS
INTRAVENOUS | Status: DC | PRN
  Administered 2022-11-09: 170 mg via INTRAVENOUS
  Administered 2022-11-09: 30 mg via INTRAVENOUS

## 2022-11-09 MED ORDER — METOCLOPRAMIDE HCL 5 MG/ML IJ SOLN
INTRAMUSCULAR | Status: AC
  Filled 2022-11-09: qty 2

## 2022-11-09 MED ORDER — HYDROMORPHONE HCL 1 MG/ML IJ SOLN: 0.2500 mg | INTRAMUSCULAR | Status: DC | PRN

## 2022-11-09 MED ORDER — LACTATED RINGERS IV SOLN: INTRAVENOUS | Status: DC | PRN

## 2022-11-09 MED ORDER — ALBUTEROL SULFATE HFA 108 (90 BASE) MCG/ACT IN AERS
INHALATION_SPRAY | RESPIRATORY_TRACT | Status: AC
  Filled 2022-11-09: qty 6.7

## 2022-11-09 MED ORDER — LINEZOLID 600 MG/300ML IV SOLN
600.0000 mg | Freq: Two times a day (BID) | INTRAVENOUS | Status: DC
  Administered 2022-11-09 – 2022-11-11 (×4): 600 mg via INTRAVENOUS
  Filled 2022-11-09 (×8): qty 300

## 2022-11-09 MED ORDER — SUCCINYLCHOLINE CHLORIDE 200 MG/10ML IV SOSY
PREFILLED_SYRINGE | INTRAVENOUS | Status: DC | PRN
  Administered 2022-11-09: 140 mg via INTRAVENOUS

## 2022-11-09 MED ORDER — ONDANSETRON HCL 4 MG/2ML IJ SOLN
4.0000 mg | Freq: Once | INTRAMUSCULAR | Status: AC
  Administered 2022-11-09: 4 mg via INTRAVENOUS
  Filled 2022-11-09: qty 2

## 2022-11-09 MED ORDER — IOHEXOL 300 MG/ML  SOLN
100.0000 mL | Freq: Once | INTRAMUSCULAR | Status: AC | PRN
  Administered 2022-11-09: 100 mL via INTRAVENOUS

## 2022-11-09 MED ORDER — HYDROCORTISONE SOD SUC (PF) 100 MG IJ SOLR
INTRAMUSCULAR | Status: DC | PRN
  Administered 2022-11-09: 100 mg via INTRAVENOUS

## 2022-11-09 MED ORDER — BUPIVACAINE LIPOSOME 1.3 % IJ SUSP
INTRAMUSCULAR | Status: DC | PRN
  Administered 2022-11-09: 20 mL

## 2022-11-09 MED ORDER — POTASSIUM CHLORIDE 10 MEQ/100ML IV SOLN
10.0000 meq | INTRAVENOUS | Status: AC
  Administered 2022-11-10 (×4): 10 meq via INTRAVENOUS
  Filled 2022-11-09 (×4): qty 100

## 2022-11-09 MED ORDER — SERTRALINE HCL 50 MG PO TABS
50.0000 mg | ORAL_TABLET | Freq: Every day | ORAL | Status: DC
  Administered 2022-11-10 – 2022-11-11 (×2): 50 mg via ORAL
  Filled 2022-11-09 (×2): qty 1

## 2022-11-09 MED ORDER — SUCCINYLCHOLINE CHLORIDE 200 MG/10ML IV SOSY
PREFILLED_SYRINGE | INTRAVENOUS | Status: AC
  Filled 2022-11-09: qty 10

## 2022-11-09 MED ORDER — PIPERACILLIN-TAZOBACTAM 3.375 G IVPB 30 MIN
3.3750 g | Freq: Once | INTRAVENOUS | Status: AC
  Administered 2022-11-09: 3.375 g via INTRAVENOUS
  Filled 2022-11-09: qty 50

## 2022-11-09 MED ORDER — HYDROCORTISONE SOD SUC (PF) 100 MG IJ SOLR
INTRAMUSCULAR | Status: AC
  Filled 2022-11-09: qty 2

## 2022-11-09 MED ORDER — LIDOCAINE 5 % EX PTCH
1.0000 | MEDICATED_PATCH | CUTANEOUS | Status: DC
  Administered 2022-11-09 – 2022-11-10 (×2): 1 via TRANSDERMAL
  Filled 2022-11-09 (×2): qty 1

## 2022-11-09 MED ORDER — METOCLOPRAMIDE HCL 5 MG/ML IJ SOLN
INTRAMUSCULAR | Status: DC | PRN
  Administered 2022-11-09: 10 mg via INTRAVENOUS

## 2022-11-09 MED ORDER — ONDANSETRON HCL 4 MG/2ML IJ SOLN
INTRAMUSCULAR | Status: DC | PRN
  Administered 2022-11-09: 4 mg via INTRAVENOUS

## 2022-11-09 MED ORDER — POTASSIUM CHLORIDE 10 MEQ/100ML IV SOLN: 10.0000 meq | INTRAVENOUS | Status: AC

## 2022-11-09 MED ORDER — MORPHINE SULFATE (PF) 2 MG/ML IV SOLN
2.0000 mg | INTRAVENOUS | Status: DC | PRN
  Administered 2022-11-10 (×3): 2 mg via INTRAVENOUS
  Filled 2022-11-09 (×3): qty 1

## 2022-11-09 MED ORDER — PROPOFOL 10 MG/ML IV BOLUS
INTRAVENOUS | Status: AC
  Filled 2022-11-09: qty 20

## 2022-11-09 MED ORDER — HEPARIN SODIUM (PORCINE) 5000 UNIT/ML IJ SOLN
5000.0000 [IU] | Freq: Three times a day (TID) | INTRAMUSCULAR | Status: DC
  Administered 2022-11-09 – 2022-11-11 (×5): 5000 [IU] via SUBCUTANEOUS
  Filled 2022-11-09 (×5): qty 1

## 2022-11-09 MED ORDER — LIDOCAINE HCL (CARDIAC) PF 50 MG/5ML IV SOSY
PREFILLED_SYRINGE | INTRAVENOUS | Status: DC | PRN
  Administered 2022-11-09: 100 mg via INTRAVENOUS

## 2022-11-09 MED ORDER — MEPERIDINE HCL 50 MG/ML IJ SOLN: 6.2500 mg | INTRAMUSCULAR | Status: DC | PRN

## 2022-11-09 MED ORDER — SODIUM CHLORIDE 0.9 % IV SOLN
2.0000 g | INTRAVENOUS | Status: DC
  Administered 2022-11-09 – 2022-11-11 (×3): 2 g via INTRAVENOUS
  Filled 2022-11-09 (×4): qty 20

## 2022-11-09 MED ORDER — MIDAZOLAM HCL 2 MG/2ML IJ SOLN
INTRAMUSCULAR | Status: DC | PRN
  Administered 2022-11-09: 2 mg via INTRAVENOUS

## 2022-11-09 MED ORDER — ONDANSETRON HCL 4 MG/2ML IJ SOLN
4.0000 mg | Freq: Four times a day (QID) | INTRAMUSCULAR | Status: DC | PRN
  Administered 2022-11-09 – 2022-11-10 (×2): 4 mg via INTRAVENOUS
  Filled 2022-11-09 (×2): qty 2

## 2022-11-09 MED ORDER — TRAZODONE HCL 50 MG PO TABS: 50.0000 mg | ORAL_TABLET | Freq: Every evening | ORAL | Status: DC | PRN

## 2022-11-09 MED ORDER — MIDAZOLAM HCL 2 MG/2ML IJ SOLN
INTRAMUSCULAR | Status: AC
  Filled 2022-11-09: qty 2

## 2022-11-09 MED ORDER — BUPIVACAINE LIPOSOME 1.3 % IJ SUSP
INTRAMUSCULAR | Status: AC
  Filled 2022-11-09: qty 20

## 2022-11-09 MED ORDER — SUGAMMADEX SODIUM 500 MG/5ML IV SOLN
INTRAVENOUS | Status: DC | PRN
  Administered 2022-11-09: 200 mg via INTRAVENOUS

## 2022-11-09 MED ORDER — KETOROLAC TROMETHAMINE 10 MG PO TABS: 10.0000 mg | ORAL_TABLET | Freq: Four times a day (QID) | ORAL | Status: DC | PRN

## 2022-11-09 MED ORDER — LACTATED RINGERS IV BOLUS
1000.0000 mL | Freq: Once | INTRAVENOUS | Status: AC
  Administered 2022-11-09: 1000 mL via INTRAVENOUS

## 2022-11-09 MED ORDER — ACETAMINOPHEN 500 MG PO TABS
1000.0000 mg | ORAL_TABLET | Freq: Four times a day (QID) | ORAL | Status: DC
  Administered 2022-11-09 – 2022-11-11 (×6): 1000 mg via ORAL
  Filled 2022-11-09 (×8): qty 2

## 2022-11-09 MED ORDER — ROCURONIUM BROMIDE 10 MG/ML (PF) SYRINGE
PREFILLED_SYRINGE | INTRAVENOUS | Status: AC
  Filled 2022-11-09: qty 10

## 2022-11-09 MED ORDER — ROCURONIUM BROMIDE 100 MG/10ML IV SOLN
INTRAVENOUS | Status: DC | PRN
  Administered 2022-11-09: 40 mg via INTRAVENOUS

## 2022-11-09 MED ORDER — PNEUMOCOCCAL 20-VAL CONJ VACC 0.5 ML IM SUSY: 0.5000 mL | PREFILLED_SYRINGE | INTRAMUSCULAR | Status: DC

## 2022-11-09 MED ORDER — OXYCODONE HCL 5 MG PO TABS
5.0000 mg | ORAL_TABLET | ORAL | Status: DC | PRN
  Administered 2022-11-09 – 2022-11-11 (×8): 5 mg via ORAL
  Filled 2022-11-09 (×8): qty 1

## 2022-11-09 SURGICAL SUPPLY — 25 items
BNDG CONFORM 2 STRL LF (GAUZE/BANDAGES/DRESSINGS) ×1 IMPLANT
BNDG GAUZE ROLL STR 2.25X3YD (GAUZE/BANDAGES/DRESSINGS) IMPLANT
CLOTH BEACON ORANGE TIMEOUT ST (SAFETY) ×1 IMPLANT
COVER LIGHT HANDLE STERIS (MISCELLANEOUS) ×2 IMPLANT
DRAIN PENROSE 12X.25 LTX STRL (MISCELLANEOUS) IMPLANT
ELECT REM PT RETURN 9FT ADLT (ELECTROSURGICAL) ×1
ELECTRODE REM PT RTRN 9FT ADLT (ELECTROSURGICAL) ×1 IMPLANT
GAUZE SPONGE 4X4 12PLY STRL (GAUZE/BANDAGES/DRESSINGS) ×2 IMPLANT
GLOVE BIOGEL PI IND STRL 6.5 (GLOVE) ×1 IMPLANT
GLOVE BIOGEL PI IND STRL 7.0 (GLOVE) ×2 IMPLANT
GLOVE SURG SS PI 6.5 STRL IVOR (GLOVE) ×1 IMPLANT
GOWN STRL REUS W/TWL LRG LVL3 (GOWN DISPOSABLE) ×2 IMPLANT
KIT TURNOVER KIT A (KITS) ×1 IMPLANT
MANIFOLD NEPTUNE II (INSTRUMENTS) ×1 IMPLANT
NS IRRIG 1000ML POUR BTL (IV SOLUTION) ×1 IMPLANT
PACK BASIC LIMB (CUSTOM PROCEDURE TRAY) IMPLANT
PACK MINOR (CUSTOM PROCEDURE TRAY) ×1 IMPLANT
PAD ABD 5X9 TENDERSORB (GAUZE/BANDAGES/DRESSINGS) IMPLANT
PAD ARMBOARD 7.5X6 YLW CONV (MISCELLANEOUS) ×1 IMPLANT
SET BASIN LINEN APH (SET/KITS/TRAYS/PACK) ×1 IMPLANT
SOL PREP PROV IODINE SCRUB 4OZ (MISCELLANEOUS) ×1 IMPLANT
SUT ETHILON 3 0 FSL (SUTURE) IMPLANT
SWAB CULTURE ESWAB REG 1ML (MISCELLANEOUS) IMPLANT
SWAB CULTURE LIQ STUART DBL (MISCELLANEOUS) IMPLANT
SYR BULB IRRIG 60ML STRL (SYRINGE) ×1 IMPLANT

## 2022-11-09 NOTE — Assessment & Plan Note (Addendum)
-   CT scan shows a 7 x 3.5 x 3.5 cm soft tissue abscess in the medial left buttock fold with involvement of the loop wire was likely surgically placed and a second component of infection extending up toward the midline and abutting the tip of the coccyx with evidence of erosion of the coccyx - Patient has vancomycin allergy - Start Rocephin and linezolid - One-time dose of Zosyn was given in the ED - Surgery was consulted and patient is currently in the OR - Continue to monitor

## 2022-11-09 NOTE — ED Notes (Signed)
Held antibiotic for blood cultures to be drawn.

## 2022-11-09 NOTE — Brief Op Note (Signed)
11/09/2022  6:04 PM  PATIENT:  Rachel Sellers  32 y.o. female  PRE-OPERATIVE DIAGNOSIS:  PERIRECTAL ABSCESS  POST-OPERATIVE DIAGNOSIS:  PERIRECTAL ABSCESS x 2, anal fistula  PROCEDURE:  Procedure(s): INCISION AND DRAINAGE PERIRECTAL ABSCESS (N/A)  SURGEON:  Surgeon(s) and Role:    * Manual Navarra A, DO - Primary  ASSISTANTS: none   ANESTHESIA:   general  EBL:  15 mL   BLOOD ADMINISTERED:none  DRAINS: Penrose drain in the posterior midline abscess and left perirectal/gluteal abscess    LOCAL MEDICATIONS USED:  BUPIVICAINE   SPECIMEN:  Source of Specimen:  gram stain of left perirectal abscess  DISPOSITION OF SPECIMEN:   microbiology  COUNTS:  YES  DICTATION: .Note written in EPIC  PLAN OF CARE: Admit to inpatient   PATIENT DISPOSITION:  PACU - hemodynamically stable.   Delay start of Pharmacological VTE agent (>24hrs) due to surgical blood loss or risk of bleeding: no

## 2022-11-09 NOTE — Transfer of Care (Signed)
Immediate Anesthesia Transfer of Care Note  Patient: Rachel Sellers  Procedure(s) Performed: INCISION AND DRAINAGE PERIRECTAL ABSCESS  Patient Location: PACU  Anesthesia Type:General  Level of Consciousness: awake, alert , oriented, and sedated  Airway & Oxygen Therapy: Patient Spontanous Breathing and Patient connected to face mask oxygen  Post-op Assessment: Report given to RN and Post -op Vital signs reviewed and stable  Post vital signs: Reviewed and stable  Last Vitals:  Vitals Value Taken Time  BP 114/61 11/09/22 1821  Temp 100.5 11/09/22    1831  Pulse 121 11/09/22 1824  Resp 23 11/09/22 1824  SpO2 100 % 11/09/22 1824  Vitals shown include unvalidated device data.  Last Pain:  Vitals:   11/09/22 1600  TempSrc:   PainSc: 5          Complications: No notable events documented.

## 2022-11-09 NOTE — Progress Notes (Addendum)
[facility]  Spoke with the patient's significant other, Rachel Sellers, on the phone.  I explained that she tolerated the procedure without difficulty.  She had 2 separate abscess pockets that were not communicating, which I was able to drain.  It also appeared that she had another fistula tract more proximal to her previous fistula tract which currently has a seton in place.  I was unable to place a seton, as I was unable to identify the tract at the end of the procedure.  She has 2 Penrose drains sutured into her abscess cavities, and packing in place.  Packing will be removed tomorrow.  She will be in the hospital while we determine what antibiotic she needs to be discharged home with.  We will attempt to get her a more prompt follow-up with Dr. Marcello Moores for further evaluation regarding interventions for her recurrent perirectal abscesses and fistula tracts.  All questions were answered to his expressed satisfaction.  Plan: -Patient admitted to the hospitalist service -Appreciate recommendations regarding antibiotics -Regular diet -Scheduled Tylenol, as needed Roxicodone and morphine -As needed antiemetics -May change ABD pad/gauze if they become saturated -I will remove iodoform packing tomorrow at bedside -Penrose drains will remain in place at discharge  Rachel Sellers, [facility] [address] [place], [place] [address] [phone] (office)

## 2022-11-09 NOTE — Consult Note (Signed)
[facility] Consult  Reason for Consult: Perirectal abscess Referring Physician: Sherrill Raring, PA-C  Chief Complaint   Abdominal Pain     HPI: Rachel Sellers is a 32 y.o. female who presents with a 3-day history of worsening pain at her coccyx.  She presented to the emergency department on Saturday, at which time she was thought to have SI joint dysfunction versus musculoskeletal pain.  The area continued to progress and became more inflamed, tender, and swollen.  She denies any drainage from her seton catheter.  Her last bowel movement was 3 days ago.  She is also having difficulty urinating secondary to pain.  She denies any fevers or chills.  Confirms some nausea associated with pain but denies vomiting.  She has not eaten anything today and only had some sips of water.  She has a history of perirectal abscess drainage in 2017 with Dr. Arnoldo Morale and in September 2023 with Dr. Constance Haw.  She subsequently underwent anal exam under anesthesia with seton placement for an anal fistula in November with Dr. Constance Haw.  Her fistula was involving some of her sphincter muscles, which prompted Dr. Constance Haw to refer her to colorectal surgery.  She is followed with Dr. Leighton Ruff, and was scheduled for a LIFT procedure next week.    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  She has a leukocytosis of 20.4 and her lactic acid is normal.  She was tachycardic in the emergency department, last heart rate 107.  Afebrile.  Past Medical History:  Diagnosis Date   Anxiety    Bipolar 1 disorder ([place])    Depression    OCD (obsessive compulsive disorder)    Renal disorder    kidney stones    Past Surgical History:  Procedure  Laterality Date   ANAL FISTULOTOMY N/A 07/31/2022   Procedure: EXAM UNDER ANESTHESIA WITH SETON PLACEMENT;  Surgeon: Virl Cagey, MD;  Location: [facility];  Service: General;  Laterality: N/A;   CERVICAL CONIZATION W/BX Left 08/23/2019   Procedure: LASER ABLATION OF CERVIX;  Surgeon: Florian Buff, MD;  Location: [facility];  Service: Gynecology;  Laterality: Left;   INCISION AND DRAINAGE ABSCESS Left 10/30/2015   Procedure: INCISION AND DRAINAGE PERI-RECTAL ABSCESS;  Surgeon: Aviva Signs, MD;  Location: [facility];  Service: General;  Laterality: Left;   INCISION AND DRAINAGE PERIRECTAL ABSCESS N/A 06/05/2022   Procedure: IRRIGATION AND DEBRIDEMENT PERIRECTAL ABSCESS; PENROSE DRAIN PLACED;  Surgeon: Virl Cagey, MD;  Location: [facility];  Service: General;  Laterality: N/A;   MULTIPLE TOOTH EXTRACTIONS  05/22/2022    Family History  Problem Relation Age of Onset   Depression Mother    Anxiety disorder Mother    Depression Sister     Social History   Tobacco Use   Smoking status: Every Day    Packs/day: 0.50    Years: 13.00    Total pack years: 6.50    Types: Cigarettes   Smokeless tobacco: Never  Vaping Use   Vaping Use: Never used  Substance Use Topics   Alcohol use: Yes    Comment: occassionally   Drug use: Yes    Types: Marijuana    Comment: occassionally    Medications: I have reviewed  the patient's current medications.  Allergies  Allergen Reactions   Gentamicin Other (See Comments)    Renal issues.   Vancomycin Other (See Comments)    Kidney failure     ROS:  Pertinent items are noted in HPI.  Blood pressure 125/73, pulse 100, temperature 98.2 F (36.8 C), resp. rate 18, last menstrual period 11/05/2022, SpO2 97 %. Physical Exam Vitals reviewed.  Constitutional:      Appearance: She is well-developed.  HENT:     Head: Normocephalic and atraumatic.  Eyes:     Extraocular Movements: Extraocular movements intact.     Pupils: Pupils are equal, round,  and reactive to light.  Cardiovascular:     Rate and Rhythm: Normal rate.  Pulmonary:     Effort: Pulmonary effort is normal.  Abdominal:     Comments: Abdomen soft, nontender, no percussion tenderness, nontender to palpation  Genitourinary:    Comments: Significant erythema in the posterior midline and left buttock with associated induration, fluctuance, and tenderness to palpation; seton catheter in place; anal exam deferred Skin:    General: Skin is warm and dry.  Neurological:     Mental Status: She is alert.     Results: Results for orders placed or performed during the hospital encounter of 11/09/22 (from the past 48 hour(s))  Comprehensive metabolic panel     Status: Abnormal   Collection Time: 11/09/22 12:05 PM  Result Value Ref Range   Sodium 137 135 - 145 mmol/L   Potassium 3.1 (L) 3.5 - 5.1 mmol/L   Chloride 101 98 - 111 mmol/L   CO2 23 22 - 32 mmol/L   Glucose, Bld 100 (H) 70 - 99 mg/dL    Comment: Glucose reference range applies only to samples taken after fasting for at least 8 hours.   BUN 16 6 - 20 mg/dL   Creatinine, Ser 0.75 0.44 - 1.00 mg/dL   Calcium 8.4 (L) 8.9 - 10.3 mg/dL   Total Protein 7.5 6.5 - 8.1 g/dL   Albumin 3.6 3.5 - 5.0 g/dL   AST 18 15 - 41 U/L   ALT 17 0 - 44 U/L   Alkaline Phosphatase 55 38 - 126 U/L   Total Bilirubin 0.6 0.3 - 1.2 mg/dL   GFR, Estimated >60 >60 mL/min    Comment: (NOTE) Calculated using the CKD-EPI Creatinine Equation (2021)    Anion gap 13 5 - 15    Comment: Performed at [facility], [address], [place], [place] [address]  CBC with Differential     Status: Abnormal   Collection Time: 11/09/22 12:05 PM  Result Value Ref Range   WBC 20.4 (H) 4.0 - 10.5 K/uL   RBC 4.32 3.87 - 5.11 MIL/uL   Hemoglobin 12.9 12.0 - 15.0 g/dL   HCT 38.3 36.0 - 46.0 %   MCV 88.7 80.0 - 100.0 fL   MCH 29.9 26.0 - 34.0 pg   MCHC 33.7 30.0 - 36.0 g/dL   RDW 13.2 11.5 - 15.5 %   Platelets 289 150 - 400 K/uL   nRBC 0.0 0.0 - 0.2 %    Neutrophils Relative % 82 %   Neutro Abs 16.7 (H) 1.7 - 7.7 K/uL   Lymphocytes Relative 10 %   Lymphs Abs 2.0 0.7 - 4.0 K/uL   Monocytes Relative 7 %   Monocytes Absolute 1.5 (H) 0.1 - 1.0 K/uL   Eosinophils Relative 0 %   Eosinophils Absolute 0.0 0.0 - 0.5 K/uL   Basophils Relative  0 %   Basophils Absolute 0.0 0.0 - 0.1 K/uL   Immature Granulocytes 1 %   Abs Immature Granulocytes 0.16 (H) 0.00 - 0.07 K/uL    Comment: Performed at [facility], [address], [place], [place] [address]  hCG, serum, qualitative     Status: None   Collection Time: 11/09/22 12:05 PM  Result Value Ref Range   Preg, Serum NEGATIVE NEGATIVE    Comment:        THE SENSITIVITY OF THIS METHODOLOGY IS >10 mIU/mL. Performed at [facility], [address] [place], [place] [address]   Urinalysis, Routine w reflex microscopic -Urine, Clean Catch     Status: Abnormal   Collection Time: 11/09/22 12:10 PM  Result Value Ref Range   Color, Urine AMBER (A) YELLOW    Comment: BIOCHEMICALS MAY BE AFFECTED BY COLOR   APPearance CLOUDY (A) CLEAR   Specific Gravity, Urine 1.026 1.005 - 1.030   pH 6.0 5.0 - 8.0   Glucose, UA NEGATIVE NEGATIVE mg/dL   Hgb urine dipstick SMALL (A) NEGATIVE   Bilirubin Urine NEGATIVE NEGATIVE   Ketones, ur 20 (A) NEGATIVE mg/dL   Protein, ur 100 (A) NEGATIVE mg/dL   Nitrite NEGATIVE NEGATIVE   Leukocytes,Ua SMALL (A) NEGATIVE   RBC / HPF 6-10 0 - 5 RBC/hpf   WBC, UA 21-50 0 - 5 WBC/hpf   Bacteria, UA RARE (A) NONE SEEN   Squamous Epithelial / HPF 11-20 0 - 5 /HPF   Mucus PRESENT     Comment: Performed at [facility], [address] [place], [place] [address]  Lactic acid, plasma     Status: None   Collection Time: 11/09/22  1:45 PM  Result Value Ref Range   Lactic Acid, Venous 1.3 0.5 - 1.9 mmol/L    Comment: Performed at [facility], [address], [place], [place] [address]  Lactic acid, plasma     Status: None   Collection Time: 11/09/22  2:52 PM  Result Value Ref Range    Lactic Acid, Venous 1.3 0.5 - 1.9 mmol/L    Comment: Performed at [facility], [address] [place],  [address]    CT ABDOMEN PELVIS W CONTRAST  Result Date: 11/09/2022 CLINICAL DATA:  Abdominal pain, acute, nonlocalized.  Rectal pain. EXAM: CT ABDOMEN AND PELVIS WITH CONTRAST TECHNIQUE: Multidetector CT imaging of the abdomen and pelvis was performed using the standard protocol following bolus administration of intravenous contrast. RADIATION DOSE REDUCTION: This exam was performed according to the departmental dose-optimization program which includes automated exposure control, adjustment of the mA and/or kV according to patient size and/or use of iterative reconstruction technique. CONTRAST:  110m OMNIPAQUE IOHEXOL 300 MG/ML  SOLN COMPARISON:  06/05/2022 FINDINGS: Lower chest: Lung bases are clear and normal. Hepatobiliary: Liver parenchyma is normal.  No calcified gallstones. Pancreas: Normal Spleen: Normal Adrenals/Urinary Tract: Adrenal glands are normal. Kidneys are normal. No mass, stone or hydronephrosis. Bladder is normal. Stomach/Bowel: Stomach and small intestine are normal. Normal appendix. Normal colon. Vascular/Lymphatic: Aorta and IVC are normal.  No adenopathy. Reproductive: No pelvic mass. Other: There is a circular radiopaque structure within the soft tissues of the medial left buttock fold, presumably placed surgically since the prior CT of 06/05/2022. Extensive recurrent abscess within the medial fat of the left buttock emanating outward from that, measuring approximately 7 x 3.5 x 3.5 cm. Second component of soft tissue infection extending upward in the midline, abutting the tip of the coccyx. When reviewing the appearance  of the coccyx over studies going back as distant as 04-01-2010, I think there is erosion of the distal coccygeal segment suggesting coccygeal osteomyelitis. Abnormal fluid does surround the last coccygeal segment. Musculoskeletal: Otherwise there are mild  chronic degenerative changes in the lower lumbar spine. IMPRESSION: 7 x 3.5 x 3.5 cm soft tissue abscess in the medial left buttock fold emanating outward from a metallic density loop wire presumably placed surgically. Second component of soft tissue infection extending upward in the midline, abutting the tip of the coccyx. When reviewing the appearance of the coccyx over studies going back as distant as Apr 01, 2010, I think there is erosion of the distal coccygeal segment suggesting coccygeal osteomyelitis. Abnormal fluid does surround the last coccygeal segment. Electronically Signed   By: Nelson Chimes M.D.   On: 11/09/2022 13:55     Assessment & Plan:  Rachel Sellers is a 32 y.o. 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  Imaging and blood work evaluated by myself.  -I explained to the patient that she has a recurrence of her perirectal abscess, which will require surgical drainage -ED physician spoke with Dr. Marcello Moores, who recommended source control with incision and drainage at this time.  The patient can subsequently follow-up with Dr. Marcello Moores after discharge from the hospital -The risk and benefits of incision and drainage of perirectal abscess were discussed including but not limited to bleeding, infection, injury to surrounding structures, development of of new anal fistula, and need for additional procedures.  After careful consideration, [place] has decided to proceed with surgery. -I explained that she will have a Penrose drain in place at the completion of surgery like after her last incision and drainage -Patient will be admitted to hospitalist for assistance with antibiotic recommendations given question of coccygeal osteomyelitis -NPO -Zosyn in the ED -PRN pain control and antiemetics -Further recommendations to follow surgery  All questions were answered to the satisfaction of the patient.  -- Graciella Freer,  DO [facility] [address] [place], [place] [address] [phone] (office)

## 2022-11-09 NOTE — Transitions of Care (Post Inpatient/ED Visit) (Signed)
   11/09/2022  Name: Rachel Sellers MRN: [id number] DOB: May 23, 1991  Today's TOC FU Call Status: Today's TOC FU Call Status:: Successful TOC FU Call Competed TOC FU Call Complete Date: 11/07/22  Transition Care Management Follow-up Telephone Call Date of Discharge: 11/07/22 [facility] Type of Discharge: Emergency Department Reason for ED Visit: Other: (low back pain) How have you been since you were released from the hospital?: Same Any questions or concerns?: No  Items Reviewed: Did you receive and understand the discharge instructions provided?: Yes Medications obtained and verified?: Yes (Medications Reviewed) Any new allergies since your discharge?: No Dietary orders reviewed?: NA Do you have support at home?: Yes People in Home: significant other  Home Care and Equipment/Supplies: [place] Ordered?: NA Any new equipment or medical supplies ordered?: NA  Functional Questionnaire: Do you need assistance with bathing/showering or dressing?: No Do you need assistance with meal preparation?: No Do you need assistance with eating?: No Do you have difficulty maintaining continence: No Do you need assistance with getting out of bed/getting out of a chair/moving?: No Do you have difficulty managing or taking your medications?: No  Folllow up appointments reviewed: PCP Follow-up appointment confirmed?: NA Specialist Hospital Follow-up appointment confirmed?: NA Do you need transportation to your follow-up appointment?: No Do you understand care options if your condition(s) worsen?: Yes-patient verbalized understanding    [place], [place] Direct Dial [phone]

## 2022-11-09 NOTE — Assessment & Plan Note (Signed)
-   Heart rate 107-123, leukocytosis of 20.4 - No evidence of endorgan damage - Lactic acid 1.3, 1.3 - Most likely due to abscess and osteomyelitis of the coccyx, but UTI could also be contributing - Blood cultures pending - Urine culture pending

## 2022-11-09 NOTE — ED Triage Notes (Signed)
Pt reports pain in her tailbone- was given muscle relaxer. She states she has abscess on buttocks. States she is only able to urinate in warm shower. 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 She had surgery set up to try to get it removed but she is concerned for infection and thinks she needs CT scan

## 2022-11-09 NOTE — ED Provider Notes (Signed)
[facility] Provider Note   CSN: [id number] Arrival date & time: 11/09/22  [id number]     History  Chief Complaint  Patient presents with   Abdominal Pain    Rachel Sellers is a 32 y.o. female.   Abdominal Pain    Patient with medical history of depression/bipolar 1, previous I&D of perirectal abscess and anal fistula lobotomy presents to the emergency department due to rectal pain.  Patient thinks she has a recurrent rectal abscess, started having pain to the left gluteal area a few days ago, seen in the ED and diagnosed with low back pain and has been taking the Lidoderm patches and anti-inflammatories with no improvement.  She is unable to defecate or urinate secondary to the severe pain, she has not had any fevers at home.   Home Medications Prior to Admission medications   Medication Sig Start Date End Date Taking? Authorizing Provider  cyclobenzaprine (FLEXERIL) 5 MG tablet Take 1 tablet (5 mg total) by mouth 2 (two) times daily as needed for muscle spasms. 11/07/22   Small, Brooke L, PA  ketorolac (TORADOL) 10 MG tablet Take 1 tablet (10 mg total) by mouth every 6 (six) hours as needed (do not take other NSAIDs with this medication). 11/07/22   Small, Brooke L, PA  lidocaine (HM LIDOCAINE PATCH) 4 % Place 1 patch onto the skin daily. 11/07/22   Small, Brooke L, PA  sertraline (ZOLOFT) 50 MG tablet Take 1 tablet (50 mg total) by mouth daily. After 1 week, increase to 100 mg daily (2 tablets). 09/18/22   Coral Spikes, DO  traZODone (DESYREL) 50 MG tablet Take 1 tablet (50 mg total) by mouth at bedtime as needed for sleep. 09/18/22   Coral Spikes, DO      Allergies    Gentamicin and Vancomycin    Review of Systems   Review of Systems  Gastrointestinal:  Positive for abdominal pain.    Physical Exam Updated Vital Signs BP 119/76 (BP Location: Right Arm)   Pulse (!) 123   Temp 98.2 F (36.8 C) (Oral)   Resp 18   LMP 11/05/2022  (Exact Date)   SpO2 98%  Physical Exam Vitals and nursing note reviewed. Exam conducted with a chaperone present.  Constitutional:      Appearance: Normal appearance.     Comments: Patient is very uncomfortable appearing  HENT:     Head: Normocephalic and atraumatic.  Eyes:     General: No scleral icterus.       Right eye: No discharge.        Left eye: No discharge.     Extraocular Movements: Extraocular movements intact.     Pupils: Pupils are equal, round, and reactive to light.  Cardiovascular:     Rate and Rhythm: Normal rate and regular rhythm.     Pulses: Normal pulses.     Heart sounds: Normal heart sounds. No murmur heard.    No friction rub. No gallop.  Pulmonary:     Effort: Pulmonary effort is normal. No respiratory distress.     Breath sounds: Normal breath sounds.  Abdominal:     General: Abdomen is flat. Bowel sounds are normal. There is no distension.     Palpations: Abdomen is soft.     Tenderness: There is no abdominal tenderness.  Genitourinary:    Comments: Left gluteal cheek with large area of induration and overlying erythema extending into the rectum Skin:  General: Skin is warm and dry.     Coloration: Skin is not jaundiced.  Neurological:     Mental Status: She is alert. Mental status is at baseline.     Coordination: Coordination normal.     ED Results / Procedures / Treatments   Labs (all labs ordered are listed, but only abnormal results are displayed) Labs Reviewed  COMPREHENSIVE METABOLIC PANEL  CBC WITH DIFFERENTIAL/PLATELET  URINALYSIS, ROUTINE W REFLEX MICROSCOPIC  HCG, SERUM, QUALITATIVE  POC URINE PREG, ED    EKG None  Radiology No results found.  Procedures .Critical Care  Performed by: Sherrill Raring, PA-C Authorized by: Sherrill Raring, PA-C   Critical care provider statement:    Critical care time (minutes):  35   Critical care start time:  11/09/2022 3:00 PM   Critical care end time:  11/09/2022 3:35 PM   Critical care  time was exclusive of:  Separately billable procedures and treating other patients   Critical care was necessary to treat or prevent imminent or life-threatening deterioration of the following conditions:  Sepsis   Critical care was time spent personally by me on the following activities:  Development of treatment plan with patient or surrogate, discussions with consultants, evaluation of patient's response to treatment, examination of patient, ordering and review of laboratory studies, ordering and review of radiographic studies, ordering and performing treatments and interventions, pulse oximetry, re-evaluation of patient's condition and review of old charts   Care discussed with: admitting provider       Medications Ordered in ED Medications  HYDROmorphone (DILAUDID) injection 1 mg (has no administration in time range)  ondansetron (ZOFRAN) injection 4 mg (has no administration in time range)    ED Course/ Medical Decision Making/ A&P Clinical Course as of 11/09/22 1533  Mon Nov 09, 2022  1449 I consulted with general surgeon Dr. Okey Dupre.  She viewed the CT result and case - patient has required multiple interventions, she is scheduled for follow-up with colorectal surgery with [facility].  Surgeon is requesting I consult [facility] on-call physician to see if they have any specific recommendations and if they would see the patient as she may need more extensive surgical evaluation  [HS]  [place] spoke with Dr. Leighton Ruff, states that patient does not need intervention by a colorectal specialist at this time.  Requesting I&D to be performed in the OR by IR on-call physician she can follow-up with the patient in her office outpatient [HS]  1521 I spoke again with our on-call surgeon Dr. Okey Dupre requesting admission to hospitalist for likely need IV antibiotics due to the osteomyelitis with surgery to consult.  She will take patient to OR for abscess I&D. [HS]     Clinical Course User Index [HS] Sherrill Raring, PA-C                             Medical Decision Making Amount and/or Complexity of Data Reviewed Labs: ordered. Radiology: ordered.  Risk Prescription drug management. Decision regarding hospitalization.   This is a 32 year old female presenting to the emergency department due to perineal abscess/rectal pain.  Differential includes sepsis, abscess, fistula.  Laboratory workup was initiated in triage, patient has significant leukocytosis of 20, no gross electrolyte derangement or AKI.  Patient meets SIRS criteria tachycardia, suspected source of infection and white count greater than 12 so sepsis order set was initiated including lactic and cultures.  Lactic was negative, blood cultures pending.  I did empirically order Zosyn, lactated Ringer's.    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    I consulted general surgery as documented in the ED course.  Patient will go to the OR for I&D, will need admission for IV antibiotics and further management of the osteomyelitis.        Final Clinical Impression(s) / ED Diagnoses Final diagnoses:  None    Rx / DC Orders ED Discharge Orders     None         Sherrill Raring, Hershal Coria 11/09/22 1708    Milton Ferguson, MD 11/10/22 [phone]

## 2022-11-09 NOTE — Assessment & Plan Note (Signed)
-   Potassium 3.1 - Reports no normal meal about 4 days - 40 mEq IV potassium ordered - Trend in the a.m.

## 2022-11-09 NOTE — Anesthesia Procedure Notes (Signed)
Procedure Name: Intubation Date/Time: 11/09/2022 4:43 PM  Performed by: Vista Deck, CRNAPre-anesthesia Checklist: Patient identified, Patient being monitored, Timeout performed, Emergency Drugs available and Suction available Patient Re-evaluated:Patient Re-evaluated prior to induction Oxygen Delivery Method: Circle system utilized Preoxygenation: Pre-oxygenation with 100% oxygen Induction Type: IV induction, Rapid sequence and Cricoid Pressure applied Laryngoscope Size: Mac and 3 Grade View: Grade I Tube type: Oral Tube size: 7.0 mm Number of attempts: 1 Airway Equipment and Method: Stylet Placement Confirmation: ETT inserted through vocal cords under direct vision, positive ETCO2 and breath sounds checked- equal and bilateral Secured at: 22 cm Tube secured with: Tape Dental Injury: Teeth and Oropharynx as per pre-operative assessment

## 2022-11-09 NOTE — Assessment & Plan Note (Signed)
-   UA suspicious for UTI - Urine culture pending - Patient reports urine retention, and required straight cath in the ED - Continue Rocephin - Continue to monitor

## 2022-11-09 NOTE — Anesthesia Preprocedure Evaluation (Addendum)
Anesthesia Evaluation  Patient identified by MRN, date of birth, ID band Patient awake    Reviewed: Allergy & Precautions, H&P , NPO status , Patient's Chart, lab work & pertinent test results  Airway Mallampati: II  TM Distance: >3 FB Neck ROM: Full    Dental  (+) Edentulous Upper, Edentulous Lower   Pulmonary Current Smoker and Patient abstained from smoking.   Pulmonary exam normal breath sounds clear to auscultation       Cardiovascular negative cardio ROS Normal cardiovascular exam Rhythm:Regular Rate:Normal     Neuro/Psych  PSYCHIATRIC DISORDERS Anxiety Depression Bipolar Disorder   negative neurological ROS     GI/Hepatic negative GI ROS,,,(+)     substance abuse  marijuana use  Endo/Other  negative endocrine ROS    Renal/GU Renal disease  negative genitourinary   Musculoskeletal negative musculoskeletal ROS (+)    Abdominal   Peds negative pediatric ROS (+)  Hematology negative hematology ROS (+)   Anesthesia Other Findings   Reproductive/Obstetrics negative OB ROS                             Anesthesia Physical Anesthesia Plan  ASA: 3 and emergent  Anesthesia Plan: General   Post-op Pain Management: Dilaudid IV   Induction: Intravenous, Rapid sequence and Cricoid pressure planned  PONV Risk Score and Plan: 3 and Ondansetron and Metaclopromide  Airway Management Planned: Oral ETT  Additional Equipment:   Intra-op Plan:   Post-operative Plan: Extubation in OR and Possible Post-op intubation/ventilation  Informed Consent: I have reviewed the patients History and Physical, chart, labs and discussed the procedure including the risks, benefits and alternatives for the proposed anesthesia with the patient or authorized representative who has indicated his/her understanding and acceptance.     Dental advisory given  Plan Discussed with: CRNA and Surgeon  Anesthesia  Plan Comments:        Anesthesia Quick Evaluation

## 2022-11-09 NOTE — Assessment & Plan Note (Signed)
-   Continue home dose of Zoloft and trazodone

## 2022-11-09 NOTE — ED Notes (Signed)
Restarted antibiotic

## 2022-11-09 NOTE — Assessment & Plan Note (Signed)
-   As seen on CT - Currently in OR for I&D - Continue linezolid and Rocephin

## 2022-11-09 NOTE — Op Note (Signed)
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  She underwent CT abdomen pelvis which demonstrated a left buttock perirectal abscess and midline abscess abutting coccyx.  She has a significant history of perirectal abscesses requiring drainage in the past.  She is agreeable to drainage at this time.  All risks and benefits of performing this procedure were discussed with the patient including pain, infection, bleeding, damage to the surrounding structures, and need for more procedures or surgery. The patient voiced understanding of the procedure, all questions were sought and answered, and consent was obtained.  Findings: Left lateral perirectal abscess with purulent and bloody drainage; midline posterior abscess abutting coccyx with purulent and bloody drainage; no communication between the 2 abscess cavities; possible anal fistula communication between left perirectal abscess and anus, more proximal to previous seton   Procedure: The patient was taken to the operating room and placed supine. General endotracheal anesthesia was induced.  Patient received Zosyn in the ED.  She was then positioned into a lithotomy position.  An orogastric tube positioned to decompress the stomach. The anus  perineum and bilateral buttocks were prepared and draped in the usual sterile fashion. A time-out was completed verifying correct patient, procedure, site, positioning, and implant(s) and/or special equipment prior to beginning this procedure.  Internal exam demonstrated significant induration of the left lateral and posterior anus.  Chronic posterior fissure was noted.  The left lateral perirectal abscess was accessed with needle and aspirated.  Hydrogen peroxide was then instilled into the cavity.  With anoscope in place, a small opening demonstrated bubbles, concerning for for a fistula.  The area of concern was proximal to the currently in place seton at the 4 to 5 o'clock position.  Incision was then made through the skin overlying the abscess as close as possible to the anal verge and the previous seton catheter (without disrupting the previous seton catheter).  The surrounding structures were dissected using a hemostat until the abscess cavity was entered.  Pus and bloody drainage were evacuated from the cavity.  Culture was obtained.  The cavity was probed and all loculations were broken up.  The cavity projected anteriorly, laterally, and medially towards the rectum.  Attention was then turned to the posterior midline abscess cavity.  The left lateral cavity did not track posteriorly.  There did not appear to be any communications with the anus on anoscopy.  The cavity was accessed with needle and aspirated.  A posterior midline incision was then created.  Using hemostat and blunt dissection, the abscess cavity was accessed.  It was abutting the coccyx, which was palpable and smooth.  The cavity was probed and all loculations were broken up.  This cavity did not communicate with the left perirectal abscess cavity.  Pus and bloody drainage were evacuated from the cavity.  Both cavities were copiously  irrigated with normal saline.  Hemostasis was obtained with electrocautery.  Upon evaluation after both  abscesses have been drained, I was unable to reidentify the possible fistula tract.  For this reason, I chose to not place a seton catheter.  1/4 inch Penrose drain was placed in both cavities and sutured in place with 3-0 nylon.  The cavities were also packed with 1 inch iodoform gauze.  A pudendal nerve block was then performed with Exparel.  The incisions were dressed with Kerlix, ABD pad, and mesh panties.  Final inspection revealed acceptable hemostasis. All counts were correct at the end of the case. The patient was awakened from anesthesia and extubated without complication.  The patient went to the PACU in stable condition.   Graciella Freer, DO  [facility] [address] [place], [place] [address] [phone] (office)

## 2022-11-09 NOTE — H&P (Signed)
History and Physical    Patient: Rachel Sellers [id number] DOB: Jun 20, 1991 DOA: 11/09/2022 DOS: the patient was seen and examined on 11/09/2022 PCP: Coral Spikes, DO  Patient coming from: Home  Chief Complaint:  Chief Complaint  Patient presents with   Abdominal Pain   HPI: Rachel Sellers is a 32 y.o. female with medical history significant of anxiety and depression presents the ED with a chief complaint of pain, sensitivity, and nausea.  Patient reports that she had pain that started 3 days ago on her backside.  He feels pressure and achy.  On a scale of 1-10 she reports that it broke the chart.  She received multiple doses of pain medicine in the ED including a total of 3 mg of Dilaudid and reports that her pain continues to be a 7 out of 10.  She reports that Dilaudid helps temporarily, but the pain returns almost immediately.  Patient denies any fever.  She reports she has been nauseous since yesterday, but she has not actually thrown up.  Patient reports her last meal was 4 days ago.  Since then she is only had a couple of cheese its per day per her report.  She has not eaten anything today per her report.  Patient reports that she had urinary retention for 2 days.  She can only void when she is in a warm shower.  In the ED for the urine sample they had to do in and out cath.  Patient reports that it hurts to bear down when she is attempting to void, but she does not actually have dysuria.  She noticed no hematuria.  Patient has no other complaints at this time.  Patient is full code.   Review of Systems: As mentioned in the history of present illness. All other systems reviewed and are negative. Past Medical History:  Diagnosis Date   Anxiety    Bipolar 1 disorder ([place])    Depression    OCD (obsessive compulsive disorder)    Renal disorder    kidney stones   Past Surgical History:  Procedure Laterality Date   ANAL FISTULOTOMY N/A 07/31/2022   Procedure: EXAM UNDER  ANESTHESIA WITH SETON PLACEMENT;  Surgeon: Virl Cagey, MD;  Location: [facility];  Service: General;  Laterality: N/A;   CERVICAL CONIZATION W/BX Left 08/23/2019   Procedure: LASER ABLATION OF CERVIX;  Surgeon: Florian Buff, MD;  Location: [facility];  Service: Gynecology;  Laterality: Left;   INCISION AND DRAINAGE ABSCESS Left 10/30/2015   Procedure: INCISION AND DRAINAGE PERI-RECTAL ABSCESS;  Surgeon: Aviva Signs, MD;  Location: [facility];  Service: General;  Laterality: Left;   INCISION AND DRAINAGE PERIRECTAL ABSCESS N/A 06/05/2022   Procedure: IRRIGATION AND DEBRIDEMENT PERIRECTAL ABSCESS; PENROSE DRAIN PLACED;  Surgeon: Virl Cagey, MD;  Location: [facility];  Service: General;  Laterality: N/A;   MULTIPLE TOOTH EXTRACTIONS  05/22/2022   Social History:  reports that she has been smoking cigarettes. She has a 6.50 pack-year smoking history. She has never used smokeless tobacco. She reports current alcohol use. She reports current drug use. Drug: Marijuana.  Allergies  Allergen Reactions   Gentamicin Other (See Comments)    Renal issues.   Vancomycin Other (See Comments)    Kidney failure    Family History  Problem Relation Age of Onset   Depression Mother    Anxiety disorder Mother    Depression Sister     Prior to Admission medications   Medication Sig  Start Date End Date Taking? Authorizing Provider  cyclobenzaprine (FLEXERIL) 5 MG tablet Take 1 tablet (5 mg total) by mouth 2 (two) times daily as needed for muscle spasms. 11/07/22  Yes Small, Brooke L, PA  lidocaine (HM LIDOCAINE PATCH) 4 % Place 1 patch onto the skin daily. 11/07/22  Yes Small, Brooke L, PA  sertraline (ZOLOFT) 50 MG tablet Take 1 tablet (50 mg total) by mouth daily. After 1 week, increase to 100 mg daily (2 tablets). 09/18/22  Yes Cook, Jayce G, DO  traZODone (DESYREL) 50 MG tablet Take 1 tablet (50 mg total) by mouth at bedtime as needed for sleep. 09/18/22  Yes Cook, Jayce G, DO  ketorolac (TORADOL) 10 MG  tablet Take 1 tablet (10 mg total) by mouth every 6 (six) hours as needed (do not take other NSAIDs with this medication). 11/07/22   Osvaldo Shipper, PA    Physical Exam: Vitals:   11/09/22 1021 11/09/22 1219 11/09/22 1600  BP: 119/76 121/74 125/73  Pulse: (!) 123 (!) 107 100  Resp: '18 18 18  '$ Temp: 98.2 F (36.8 C) 98.1 F (36.7 C) 98.2 F (36.8 C)  TempSrc: Oral Oral   SpO2: 98% 99% 97%   1.  General: Patient lying supine in bed,  no acute distress   2. Psychiatric: Alert and oriented x 3, mood and behavior normal for situation, pleasant and cooperative with exam   3. Neurologic: Speech and language are normal, face is symmetric, moves all 4 extremities voluntarily, at baseline without acute deficits on limited exam   4. HEENMT:  Head is atraumatic, normocephalic, pupils reactive to light, neck is supple, trachea is midline, mucous membranes are moist   5. Respiratory : Lungs are clear to auscultation bilaterally without wheezing, rhonchi, rales, no cyanosis, no increase in work of breathing or accessory muscle use   6. Cardiovascular : Heart rate normal, rhythm is regular, no murmurs, rubs or gallops, no peripheral edema, peripheral pulses palpated   7. Gastrointestinal:  Abdomen is soft, nondistended, nontender to palpation bowel sounds active, no masses or organomegaly palpated   8. Skin:  Erythema and induration over her left buttocks, sensitive to palpation over left buttock, sacrum, coccyx, and medial right buttock.   9.Musculoskeletal:  No acute deformities or trauma, no asymmetry in tone, no peripheral edema, peripheral pulses palpated, no tenderness to palpation in the extremities  Data Reviewed: In the ED Temp 98.1, heart rate 107-123, respiratory rate 18, blood pressure 119/74-121/76, satting 99% Leukocytosis 20.4, hemoglobin 12.9, platelets 289 Chemistry reveals a hypokalemia at 3.1 Lactic acid 1.3, 1.3 UA is suspicious for UTI Blood cultures  pending Patient was started on Zosyn Surgery was consulted and recommended consult to colorectal surgery who reported that the patient could be admitted and managed here CT scan showed abscess and osteomyelitis Admission for osteomyelitis Assessment and Plan: SIRS (systemic inflammatory response syndrome) (HCC) - Heart rate 107-123, leukocytosis of 20.4 - No evidence of endorgan damage - Lactic acid 1.3, 1.3 - Most likely due to abscess and osteomyelitis of the coccyx, but UTI could also be contributing - Blood cultures pending - Urine culture pending  UTI (urinary tract infection) - UA suspicious for UTI - Urine culture pending - Patient reports urine retention, and required straight cath in the ED - Continue Rocephin - Continue to monitor  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[place]) - CT scan shows a 7 x 3.5 x 3.5 cm soft tissue abscess in the medial left buttock fold with involvement  of the loop wire was likely surgically placed and a second component of infection extending up toward the midline and abutting the tip of the coccyx with evidence of erosion of the coccyx - Patient has vancomycin allergy - Start Rocephin and linezolid - One-time dose of Zosyn was given in the ED - Surgery was consulted and patient is currently in the OR - Continue to monitor  Perianal abscess - As seen on CT - Currently in OR for I&D - Continue linezolid and Rocephin  Anxiety and depression - Continue home dose of Zoloft and trazodone  Hypokalemia - Potassium 3.1 - Reports no normal meal about 4 days - 40 mEq IV potassium ordered - Trend in the a.m.      Advance Care Planning:   Code Status: Full Code  Consults: Surgery  Family Communication: No family at bedside  Severity of Illness: The appropriate patient status for this patient is INPATIENT. Inpatient status is judged to be reasonable and necessary in order to provide the required intensity of service to ensure the patient's safety.  The patient's presenting symptoms, physical exam findings, and initial radiographic and laboratory data in the context of their chronic comorbidities is felt to place them at high risk for further clinical deterioration. Furthermore, it is not anticipated that the patient will be medically stable for discharge from the hospital within 2 midnights of admission.   * I certify that at the point of admission it is my clinical judgment that the patient will require inpatient hospital care spanning beyond 2 midnights from the point of admission due to high intensity of service, high risk for further deterioration and high frequency of surveillance required.*  Author: Rolla Plate, DO 11/09/2022 5:11 PM  For on call review [email or web address].

## 2022-11-09 NOTE — Anesthesia Postprocedure Evaluation (Signed)
Anesthesia Post Note  Patient: Rachel Sellers  Procedure(s) Performed: INCISION AND DRAINAGE PERIRECTAL ABSCESS  Patient location during evaluation: PACU Anesthesia Type: General Level of consciousness: awake and alert and oriented Pain management: pain level controlled Vital Signs Assessment: post-procedure vital signs reviewed and stable Respiratory status: spontaneous breathing, nonlabored ventilation, respiratory function stable and patient connected to nasal cannula oxygen Cardiovascular status: blood pressure returned to baseline and stable Postop Assessment: no apparent nausea or vomiting Anesthetic complications: no  No notable events documented.   Last Vitals:  Vitals:   11/09/22 1830 11/09/22 1845  BP: 125/73 99/61  Pulse: (!) 115 (!) 107  Resp: 18 (!) 21  Temp:    SpO2: 94% 95%    Last Pain:  Vitals:   11/09/22 1845  TempSrc:   PainSc: Asleep                 Fredick Schlosser C Jazell Rosenau

## 2022-11-10 ENCOUNTER — Other Ambulatory Visit: Payer: Self-pay

## 2022-11-10 ENCOUNTER — Ambulatory Visit: Payer: BC Managed Care – PPO | Admitting: Family Medicine

## 2022-11-10 DIAGNOSIS — K611 Rectal abscess: Secondary | ICD-10-CM

## 2022-11-10 DIAGNOSIS — M4628 Osteomyelitis of vertebra, sacral and sacrococcygeal region: Secondary | ICD-10-CM | POA: Diagnosis not present

## 2022-11-10 LAB — CBC WITH DIFFERENTIAL/PLATELET
Abs Immature Granulocytes: 0.2 10*3/uL — ABNORMAL HIGH (ref 0.00–0.07)
Basophils Absolute: 0.1 10*3/uL (ref 0.0–0.1)
Basophils Relative: 0 %
Eosinophils Absolute: 0 10*3/uL (ref 0.0–0.5)
Eosinophils Relative: 0 %
HCT: 32 % — ABNORMAL LOW (ref 36.0–46.0)
Hemoglobin: 10.8 g/dL — ABNORMAL LOW (ref 12.0–15.0)
Immature Granulocytes: 1 %
Lymphocytes Relative: 11 %
Lymphs Abs: 2.5 10*3/uL (ref 0.7–4.0)
MCH: 29.9 pg (ref 26.0–34.0)
MCHC: 33.8 g/dL (ref 30.0–36.0)
MCV: 88.6 fL (ref 80.0–100.0)
Monocytes Absolute: 1.2 10*3/uL — ABNORMAL HIGH (ref 0.1–1.0)
Monocytes Relative: 6 %
Neutro Abs: 18.1 10*3/uL — ABNORMAL HIGH (ref 1.7–7.7)
Neutrophils Relative %: 82 %
Platelets: 263 10*3/uL (ref 150–400)
RBC: 3.61 MIL/uL — ABNORMAL LOW (ref 3.87–5.11)
RDW: 13.4 % (ref 11.5–15.5)
WBC: 22.1 10*3/uL — ABNORMAL HIGH (ref 4.0–10.5)
nRBC: 0 % (ref 0.0–0.2)

## 2022-11-10 LAB — COMPREHENSIVE METABOLIC PANEL
ALT: 14 U/L (ref 0–44)
AST: 19 U/L (ref 15–41)
Albumin: 2.9 g/dL — ABNORMAL LOW (ref 3.5–5.0)
Alkaline Phosphatase: 48 U/L (ref 38–126)
Anion gap: 7 (ref 5–15)
BUN: 11 mg/dL (ref 6–20)
CO2: 23 mmol/L (ref 22–32)
Calcium: 7.9 mg/dL — ABNORMAL LOW (ref 8.9–10.3)
Chloride: 103 mmol/L (ref 98–111)
Creatinine, Ser: 0.62 mg/dL (ref 0.44–1.00)
GFR, Estimated: >60 mL/min (ref >60)
Glucose, Bld: 119 mg/dL — ABNORMAL HIGH (ref 70–99)
Potassium: 3.2 mmol/L — ABNORMAL LOW (ref 3.5–5.1)
Sodium: 133 mmol/L — ABNORMAL LOW (ref 135–145)
Total Bilirubin: 0.7 mg/dL (ref 0.3–1.2)
Total Protein: 6.4 g/dL — ABNORMAL LOW (ref 6.5–8.1)

## 2022-11-10 LAB — MAGNESIUM: Magnesium: 1.7 mg/dL (ref 1.7–2.4)

## 2022-11-10 LAB — C-REACTIVE PROTEIN: CRP: 28.3 mg/dL — ABNORMAL HIGH (ref <1.0)

## 2022-11-10 LAB — SEDIMENTATION RATE: Sed Rate: 67 mm/hr — ABNORMAL HIGH (ref 0–22)

## 2022-11-10 MED ORDER — METRONIDAZOLE 500 MG PO TABS
500.0000 mg | ORAL_TABLET | Freq: Two times a day (BID) | ORAL | Status: DC
  Administered 2022-11-10 – 2022-11-11 (×2): 500 mg via ORAL
  Filled 2022-11-10 (×2): qty 1

## 2022-11-10 MED ORDER — POLYETHYLENE GLYCOL 3350 17 G PO PACK
17.0000 g | PACK | Freq: Every day | ORAL | Status: DC
  Administered 2022-11-10 – 2022-11-11 (×2): 17 g via ORAL
  Filled 2022-11-10 (×2): qty 1

## 2022-11-10 MED ORDER — MAGNESIUM SULFATE 2 GM/50ML IV SOLN
2.0000 g | Freq: Once | INTRAVENOUS | Status: AC
  Administered 2022-11-10: 2 g via INTRAVENOUS
  Filled 2022-11-10: qty 50

## 2022-11-10 MED ORDER — SENNOSIDES-DOCUSATE SODIUM 8.6-50 MG PO TABS
1.0000 | ORAL_TABLET | Freq: Two times a day (BID) | ORAL | Status: DC
  Administered 2022-11-10 – 2022-11-11 (×3): 1 via ORAL
  Filled 2022-11-10 (×3): qty 1

## 2022-11-10 MED ORDER — POTASSIUM CHLORIDE CRYS ER 20 MEQ PO TBCR
40.0000 meq | EXTENDED_RELEASE_TABLET | ORAL | Status: AC
  Administered 2022-11-10 (×2): 40 meq via ORAL
  Filled 2022-11-10 (×2): qty 2

## 2022-11-10 NOTE — TOC Initial Note (Signed)
Transition of Care [facility]) - Initial/Assessment Note    Patient Details  Name: Rachel Sellers MRN: [id number] Date of Birth: 1991/02/10  Transition of Care (TOC) CM/SW Contact:    Joaquin Courts, RN Phone Number: 11/10/2022, 10:10 AM  Clinical Narrative:                      Transition of Care Department [facility]) has reviewed patient and no TOC needs have been identified at this time. We will continue to monitor patient advancement through interdisciplinary progression rounds. If new patient transition needs arise, please place a TOC consult.    Expected Discharge Plan: Home/Self Care Barriers to Discharge: Continued Medical Work up

## 2022-11-10 NOTE — Progress Notes (Addendum)
PROGRESS NOTE    Rachel Sellers  [id number] DOB: 27-Aug-1991 DOA: 11/09/2022 PCP: Coral Spikes, DO    Brief Narrative:   Rachel Sellers is a 32 y.o. female with past medical history significant for anxiety/depression, history of perirectal abscess s/p I&D 2017 with Dr. Arnoldo Morale and 2023 with Dr. 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 Constance Haw who presented to [facility] ED on 2/26 with buttock pain with concern for recurrent abscess.  Patient reports last bowel movement 3 days prior.  Also reports difficulty urinating due to pain.  Denies fever/chills.  Patient was recently referred to colorectal surgery, Dr. Leighton Ruff and was scheduled to have a LIFT procedure next week.  In the ED, temperature 98.2 F, HR 123, RR 18, BP 119/76, SpO2 98% on room air.  WBC 20.4, hemoglobin 12.9, platelets 289.  Sodium 137, potassium 3.1, chloride 101, CO2 23, glucose 100, BUN 16, creatinine 0.75.  AST 18, ALT 17, total bilirubin 0.6.  Lactic acid 1.3.  Urinalysis with small leukocytes, negative nitrite, rare bacteria, 21-50 WBCs.  CT abdomen/pelvis with 7 x 3.5 x 3.5 cm soft tissue abscess medial left buttock fold emanating outward from a metallic density loop wire with soft tissue infection, concern for erosion of the distal coccygeal segment consistent with coccygeal osteomyelitis.  Urine culture and blood cultures obtained.  General surgery consulted.  TRH consulted for admission for further evaluation management of perirectal abscess with concern of acute osteomyelitis coccygeal region.  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  CT abdomen/pelvis with 7 x 3.5 x 3.5 cm soft tissue abscess medial left buttock with concern for erosion of the distal coccygeal segment consistent with osteomyelitis.  General  surgery was consulted and patient underwent I&D perirectal abscess by Dr. Okey Dupre on 11/09/2022. -- General surgery following, appreciate assistance -- WBC 20.4>22.1 -- Blood cultures x 2: No growth less than 24 hours -- ESR: 67 -- CRP: Pending -- Operative culture with few GPC, GPR, GNR's on Gram stain, culture with rare E. coli; susceptibilities and further pending -- Zyvox 600 mg IV every 12 hours -- Ceftriaxone 2 g IV every 24 hours -- Metronidazole '500mg'$  PO q12h -- Supportive care, pain control, CBC daily -- Discussed with infectious disease, Dr. Baxter Flattery on 2/27; recommended continue Zyvox, ceftriaxone, add metronidazole and await finalized cultures.  Anticipate 2 weeks of IV antibiotics on discharge followed by transition to orals.  Hypokalemia Potassium 3.2, will replete.  Depression/anxiety: --Sertraline 50 mg p.o. daily  Morbid obesity Body mass index is 34.13 kg/m.  Patient needs aggressive lifestyle changes/weight loss as this complicates all facets of care.  Outpatient follow-up with PCP.  May benefit from bariatric evaluation outpatient.   DVT prophylaxis: heparin injection 5,000 Units Start: 11/09/22 2200 SCDs Start: 11/09/22 1959    Code Status: Full Code Family Communication: No family present at bedside this morning  Disposition Plan:  Level of care: Med-Surg Status is: Inpatient Remains inpatient appropriate because: I antibiotics, awaiting finalized operative culture, anticipate likely need of prolonged IV antibiotic therapy given concern for osteomyelitis on CT    Consultants:  General surgery  Procedures:  I&D perirectal abscess, Dr. Okey Dupre; 2/26  Antimicrobials:  Linezolid 2/26>> Ceftriaxone 2/26>> Zosyn 2/26 - 2/26   Subjective: Patient seen examined bedside, resting comfortably.  Lying in bed.  Pain controlled  when not moving around.  No other specific complaints or concerns at this time.  Discussed with patient may need prolonged IV  antibiotic therapy given concern for osteomyelitis on CT scan.  No other specific complaints or concerns at this time.  Discussed with general surgery this afternoon.  Denies headache, no fever/chills/night sweats, no nausea/vomiting/diarrhea, no chest pain, no palpitations, no shortness of breath, no abdominal pain.  No acute events overnight per nursing staff.  Objective: Vitals:   11/09/22 2339 11/10/22 0056 11/10/22 0537 11/10/22 0756  BP: 102/63  114/66 107/66  Pulse: (!) 102  97 99  Resp: '20  18 16  '$ Temp: 98.5 F (36.9 C)  98.7 F (37.1 C) 99.2 F (37.3 C)  TempSrc:    Oral  SpO2: 98%  99% 99%  Weight:  87.4 kg    Height:  '5\' 3"'$  (1.6 m)      Intake/Output Summary (Last 24 hours) at 11/10/2022 1307 Last data filed at 11/10/2022 [id number] Gross per 24 hour  Intake 1940 ml  Output 590 ml  Net 1350 ml   Filed Weights   11/10/22 0056  Weight: 87.4 kg    Examination:  Physical Exam: GEN: NAD, alert and oriented x 3, obese HEENT: NCAT, PERRL, EOMI, sclera clear, MMM PULM: CTAB w/o wheezes/crackles, normal respiratory effort, on room air CV: RRR w/o M/G/R GI: abd soft, NTND, NABS, noted perirectal surgical incision site with Penrose drains/packing in place MSK: no peripheral edema, moves all extremities independently NEURO: CN II-XII intact, no focal deficits, sensation to light touch intact PSYCH: normal mood/affect Integumentary: Surgical incision sites noted as above with Penrose drains/packing in place, otherwise no other concerning rashes/lesions/wounds on exposed skin surfaces    Data Reviewed: I have personally reviewed following labs and imaging studies  CBC: Recent Labs  Lab 11/09/22 1205 11/10/22 0510  WBC 20.4* 22.1*  NEUTROABS 16.7* 18.1*  HGB 12.9 10.8*  HCT 38.3 32.0*  MCV 88.7 88.6  PLT 289 [id number]   Basic Metabolic Panel: Recent Labs  Lab 11/09/22 1205 11/10/22 0510  NA 137 133*  K 3.1* 3.2*  CL 101 103  CO2 23 23  GLUCOSE 100* 119*  BUN 16 11   CREATININE 0.75 0.62  CALCIUM 8.4* 7.9*  MG  --  1.7   GFR: Estimated Creatinine Clearance: 105.8 mL/min (by C-G formula based on SCr of 0.62 mg/dL). Liver Function Tests: Recent Labs  Lab 11/09/22 1205 11/10/22 0510  AST 18 19  ALT 17 14  ALKPHOS 55 48  BILITOT 0.6 0.7  PROT 7.5 6.4*  ALBUMIN 3.6 2.9*   No results for input(s): "LIPASE", "AMYLASE" in the last 168 hours. No results for input(s): "AMMONIA" in the last 168 hours. Coagulation Profile: No results for input(s): "INR", "PROTIME" in the last 168 hours. Cardiac Enzymes: No results for input(s): "CKTOTAL", "CKMB", "CKMBINDEX", "TROPONINI" in the last 168 hours. BNP (last 3 results) No results for input(s): "PROBNP" in the last 8760 hours. HbA1C: No results for input(s): "HGBA1C" in the last 72 hours. CBG: No results for input(s): "GLUCAP" in the last 168 hours. Lipid Profile: No results for input(s): "CHOL", "HDL", "LDLCALC", "TRIG", "CHOLHDL", "LDLDIRECT" in the last 72 hours. Thyroid Function Tests: No results for input(s): "TSH", "T4TOTAL", "FREET4", "T3FREE", "THYROIDAB" in the last 72 hours. Anemia Panel: No results for input(s): "VITAMINB12", "FOLATE", "FERRITIN", "TIBC", "IRON", "RETICCTPCT" in the last 72 hours. Sepsis Labs: Recent Labs  Lab 11/09/22 1345 11/09/22 1452  LATICACIDVEN 1.3 1.3    Recent  Results (from the past 240 hour(s))  Blood culture (routine x 2)     Status: None (Preliminary result)   Collection Time: 11/09/22  1:45 PM   Specimen: BLOOD  Result Value Ref Range Status   Specimen Description BLOOD LEFT ARM  Final   Special Requests   Final    BOTTLES DRAWN AEROBIC AND ANAEROBIC Blood Culture adequate volume   Culture   Final    NO GROWTH < 24 HOURS Performed at [facility], [address] [place], [place] [address]    Report Status PENDING  Incomplete  Blood culture (routine x 2)     Status: None (Preliminary result)   Collection Time: 11/09/22  1:50 PM   Specimen: BLOOD   Result Value Ref Range Status   Specimen Description BLOOD RIGHT ARM  Final   Special Requests   Final    BOTTLES DRAWN AEROBIC AND ANAEROBIC Blood Culture adequate volume   Culture   Final    NO GROWTH < 24 HOURS Performed at [facility], [address] [place], [place] [address]    Report Status PENDING  Incomplete  Aerobic/Anaerobic Culture w Gram Stain (surgical/deep wound)     Status: None (Preliminary result)   Collection Time: 11/09/22  5:52 PM   Specimen: PATH Other; Body Fluid  Result Value Ref Range Status   Specimen Description   Final    ABSCESS FLUID PERIANAL Performed at [facility], [address], [place], [place] [address]    Special Requests   Final    NONE Performed at [facility], [address] [place] [address] [place], [place] [address]    Gram Stain   Final    FEW WBC PRESENT,BOTH PMN AND MONONUCLEAR FEW GRAM POSITIVE COCCI FEW GRAM POSITIVE RODS FEW GRAM NEGATIVE RODS    Culture   Final    RARE ESCHERICHIA COLI SUSCEPTIBILITIES TO FOLLOW Performed at [facility], [place] [address], [place], [place] [address]    Report Status PENDING  Incomplete         Radiology Studies: CT ABDOMEN PELVIS W CONTRAST  Result Date: 11/09/2022 CLINICAL DATA:  Abdominal pain, acute, nonlocalized.  Rectal pain. EXAM: CT ABDOMEN AND PELVIS WITH CONTRAST TECHNIQUE: Multidetector CT imaging of the abdomen and pelvis was performed using the standard protocol following bolus administration of intravenous contrast. RADIATION DOSE REDUCTION: This exam was performed according to the departmental dose-optimization program which includes automated exposure control, adjustment of the mA and/or kV according to patient size and/or use of iterative reconstruction technique. CONTRAST:  174m OMNIPAQUE IOHEXOL 300 MG/ML  SOLN COMPARISON:  06/05/2022 FINDINGS: Lower chest: Lung bases are clear and normal. Hepatobiliary: Liver parenchyma is normal.  No calcified gallstones. Pancreas: Normal Spleen:  Normal Adrenals/Urinary Tract: Adrenal glands are normal. Kidneys are normal. No mass, stone or hydronephrosis. Bladder is normal. Stomach/Bowel: Stomach and small intestine are normal. Normal appendix. Normal colon. Vascular/Lymphatic: Aorta and IVC are normal.  No adenopathy. Reproductive: No pelvic mass. Other: There is a circular radiopaque structure within the soft tissues of the medial left buttock fold, presumably placed surgically since the prior CT of 06/05/2022. Extensive recurrent abscess within the medial fat of the left buttock emanating outward from that, measuring approximately 7 x 3.5 x 3.5 cm. Second component of soft tissue infection extending upward in the midline, abutting the tip of the coccyx. When reviewing the appearance of the coccyx over studies going back as distant as 007/30/11 I think there is erosion of the distal coccygeal  segment suggesting coccygeal osteomyelitis. Abnormal fluid does surround the last coccygeal segment. Musculoskeletal: Otherwise there are mild chronic degenerative changes in the lower lumbar spine. IMPRESSION: 7 x 3.5 x 3.5 cm soft tissue abscess in the medial left buttock fold emanating outward from a metallic density loop wire presumably placed surgically. Second component of soft tissue infection extending upward in the midline, abutting the tip of the coccyx. When reviewing the appearance of the coccyx over studies going back as distant as April 10, 2010, I think there is erosion of the distal coccygeal segment suggesting coccygeal osteomyelitis. Abnormal fluid does surround the last coccygeal segment. Electronically Signed   By: Nelson Chimes M.D.   On: 11/09/2022 13:55        Scheduled Meds:  acetaminophen  1,000 mg Oral Q6H   heparin  5,000 Units Subcutaneous Q8H   lidocaine  1 patch Transdermal Q24H   mupirocin ointment  1 Application Nasal BID   pneumococcal 20-valent conjugate vaccine  0.5 mL Intramuscular Tomorrow-1000   polyethylene glycol  17 g  Oral Daily   senna-docusate  1 tablet Oral BID   sertraline  50 mg Oral Daily   Continuous Infusions:  cefTRIAXone (ROCEPHIN)  IV 2 g (11/09/22 2226)   linezolid (ZYVOX) IV 600 mg (11/10/22 1035)     LOS: 1 day    Time spent: 52 minutes spent on chart review, discussion with nursing staff, consultants, updating family and interview/physical exam; more than 50% of that time was spent in counseling and/or coordination of care.    Jarmal Lewelling J [place] DO [facility] Available via Epic secure chat 7am-7pm After these hours, please refer to coverage provider listed on [email or web address] 11/10/2022, 1:07 PM

## 2022-11-10 NOTE — Progress Notes (Signed)
Patient arrived to room 335 around 2010. She is oriented to room, call bell, and unit. Patient experienced n/v x2 during this shift. PRN Zofran given as ordered and helped decreased symptoms. Patient c/o pain during this shift x2, PRN pain medications given as ordered.

## 2022-11-10 NOTE — Progress Notes (Signed)
[facility] Progress Note  1 Day Post-Op  Subjective: Patient seen and examined.  She is resting comfortably in bed.  Her pain is slightly improved from the pain she was having prior to surgery.  She no longer has pain at rest, but she still has pain with movement.  She denies nausea and vomiting.  She confirms passing flatus but no bowel movements since surgery.  Objective: Vital signs in last 24 hours: Temp:  [98.2 F (36.8 C)-100.5 F (38.1 C)] 99.2 F (37.3 C) (02/27 0756) Pulse Rate:  [97-121] 99 (02/27 0756) Resp:  [16-26] 16 (02/27 0756) BP: (99-125)/(54-79) 107/66 (02/27 0756) SpO2:  [94 %-100 %] 99 % (02/27 0756) Weight:  [87.4 kg] 87.4 kg (02/27 0056) Last BM Date : 11/05/22  Intake/Output from previous day: 02/26 0701 - 02/27 0700 In: 1700 [I.V.:1100; IV Piggyback:600] Out: 590 [Urine:575; Blood:15] Intake/Output this shift: Total I/O In: 240 [P.O.:240] Out: -   General appearance: alert, cooperative, and no distress GI: Abdomen soft, nondistended, nontender; perirectal area with persistent erythema, induration, and tenderness (slightly improved from yesterday), 2 incision sites with penrose drains and packing in place, serosanguinous drainage on gauze  Lab Results:  Recent Labs    11/09/22 1205 11/10/22 0510  WBC 20.4* 22.1*  HGB 12.9 10.8*  HCT 38.3 32.0*  PLT 289 263   BMET Recent Labs    11/09/22 1205 11/10/22 0510  NA 137 133*  K 3.1* 3.2*  CL 101 103  CO2 23 23  GLUCOSE 100* 119*  BUN 16 11  CREATININE 0.75 0.62  CALCIUM 8.4* 7.9*   PT/INR No results for input(s): "LABPROT", "INR" in the last 72 hours.  Studies/Results: CT ABDOMEN PELVIS W CONTRAST  Result Date: 11/09/2022 CLINICAL DATA:  Abdominal pain, acute, nonlocalized.  Rectal pain. EXAM: CT ABDOMEN AND PELVIS WITH CONTRAST TECHNIQUE: Multidetector CT imaging of the abdomen and pelvis was performed using the standard protocol following bolus administration of  intravenous contrast. RADIATION DOSE REDUCTION: This exam was performed according to the departmental dose-optimization program which includes automated exposure control, adjustment of the mA and/or kV according to patient size and/or use of iterative reconstruction technique. CONTRAST:  180m OMNIPAQUE IOHEXOL 300 MG/ML  SOLN COMPARISON:  06/05/2022 FINDINGS: Lower chest: Lung bases are clear and normal. Hepatobiliary: Liver parenchyma is normal.  No calcified gallstones. Pancreas: Normal Spleen: Normal Adrenals/Urinary Tract: Adrenal glands are normal. Kidneys are normal. No mass, stone or hydronephrosis. Bladder is normal. Stomach/Bowel: Stomach and small intestine are normal. Normal appendix. Normal colon. Vascular/Lymphatic: Aorta and IVC are normal.  No adenopathy. Reproductive: No pelvic mass. Other: There is a circular radiopaque structure within the soft tissues of the medial left buttock fold, presumably placed surgically since the prior CT of 06/05/2022. Extensive recurrent abscess within the medial fat of the left buttock emanating outward from that, measuring approximately 7 x 3.5 x 3.5 cm. Second component of soft tissue infection extending upward in the midline, abutting the tip of the coccyx. When reviewing the appearance of the coccyx over studies going back as distant as 0August 11, 2011 I think there is erosion of the distal coccygeal segment suggesting coccygeal osteomyelitis. Abnormal fluid does surround the last coccygeal segment. Musculoskeletal: Otherwise there are mild chronic degenerative changes in the lower lumbar spine. IMPRESSION: 7 x 3.5 x 3.5 cm soft tissue abscess in the medial left buttock fold emanating outward from a metallic density loop wire presumably placed surgically. Second component of soft tissue infection extending upward in the midline,  abutting the tip of the coccyx. When reviewing the appearance of the coccyx over studies going back as distant as 16-Apr-2010, I think there  is erosion of the distal coccygeal segment suggesting coccygeal osteomyelitis. Abnormal fluid does surround the last coccygeal segment. Electronically Signed   By: Nelson Chimes M.D.   On: 11/09/2022 13:55    Anti-infectives: Anti-infectives (From admission, onward)    Start     Dose/Rate Route Frequency Ordered Stop   11/09/22 2100  cefTRIAXone (ROCEPHIN) 2 g in sodium chloride 0.9 % 100 mL IVPB        2 g 200 mL/hr over 30 Minutes Intravenous Every 24 hours 11/09/22 1958     11/09/22 1800  linezolid (ZYVOX) IVPB 600 mg        600 mg 300 mL/hr over 60 Minutes Intravenous Every 12 hours 11/09/22 1700     11/09/22 1315  piperacillin-tazobactam (ZOSYN) IVPB 3.375 g        3.375 g 100 mL/hr over 30 Minutes Intravenous  Once 11/09/22 1303 11/09/22 1428       Assessment/Plan:  Patient is a 32 year old female admitted with perirectal abscess and possible coccyx osteomyelitis. S/P I&D of perirectal abscess x2 with penrose drain placement 2/26  -Rocephin and Linezolid per hospitalist -Gram stain with gram positive cocci, gram positive rods, and gram negative rods -Await final culture -Regular diet -Packing removed from incision sites -Flush penrose drains BID -Sitz baths QID and PRN after Bms -Bowel regimen ordered -PRN pain control and antiemetics -Appreciate hospitalist recommendations   LOS: 1 day    Rachel Sellers A Quince Santana 11/10/2022

## 2022-11-10 NOTE — Consult Note (Signed)
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 Patient being evaluated by dr Leighton Ruff for lift procedure.she was admitted on 2/24 for progress worsening coccygeal pain that was possibly related to SI joint dysfunction. 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 Pertinent labs on admit include wbc of 20K, in setting of sirs. Sed rate of 67, crp of 28. She was taken to the OR for IX D of perianal abscess by dr pappayliou who sent off cultures, polymicrobial including e.coli. she was started on piptazo plus linezolid due to vanco allergy- associated with AKI.blood cx ngtd at 24hrs.    Past Medical History:  Diagnosis Date   Anxiety    Bipolar 1 disorder ([place])    Depression    OCD (obsessive compulsive disorder)    Renal disorder    kidney stones    Allergies:  Allergies  Allergen Reactions   Gentamicin Other (See Comments)    Renal issues.   Vancomycin Other (See Comments)    Kidney failure     MEDICATIONS:  acetaminophen  1,000 mg Oral Q6H   heparin  5,000 Units Subcutaneous Q8H   lidocaine  1 patch Transdermal Q24H   mupirocin ointment  1 Application Nasal BID   pneumococcal 20-valent conjugate vaccine  0.5 mL Intramuscular Tomorrow-1000   polyethylene glycol  17 g Oral Daily   senna-docusate  1 tablet Oral BID   sertraline   50 mg Oral Daily    Social History   Tobacco Use   Smoking status: Every Day    Packs/day: 0.50    Years: 13.00    Total pack years: 6.50    Types: Cigarettes   Smokeless tobacco: Never  Vaping Use   Vaping Use: Never used  Substance Use Topics   Alcohol use: Yes    Comment: occassionally   Drug use: Yes    Types: Marijuana    Comment: occassionally    Family History  Problem Relation Age of Onset   Depression Mother    Anxiety disorder Mother    Depression Sister     Review of Systems -  Did not interview patient  OBJECTIVE: Temp:  [98.2 F (36.8 C)-100.5 F (38.1 C)] 99.2 F (37.3 C) (02/27 0756) Pulse Rate:  [97-121] 99 (02/27 0756) Resp:  [16-26] 16 (02/27 0756) BP: (99-125)/(54-79) 107/66 (02/27 0756) SpO2:  [94 %-100 %] 99 % (02/27 0756) Weight:  [87.4 kg] 87.4 kg (02/27 0056) Did not examine patient  LABS: Results for orders placed or performed during the hospital encounter of 11/09/22 (from the past 48 hour(s))  Comprehensive metabolic panel     Status: Abnormal   Collection Time: 11/09/22 12:05 PM  Result Value Ref Range   Sodium 137 135 - 145 mmol/L   Potassium 3.1 (L) 3.5 - 5.1 mmol/L  Chloride 101 98 - 111 mmol/L   CO2 23 22 - 32 mmol/L   Glucose, Bld 100 (H) 70 - 99 mg/dL    Comment: Glucose reference range applies only to samples taken after fasting for at least 8 hours.   BUN 16 6 - 20 mg/dL   Creatinine, Ser 0.75 0.44 - 1.00 mg/dL   Calcium 8.4 (L) 8.9 - 10.3 mg/dL   Total Protein 7.5 6.5 - 8.1 g/dL   Albumin 3.6 3.5 - 5.0 g/dL   AST 18 15 - 41 U/L   ALT 17 0 - 44 U/L   Alkaline Phosphatase 55 38 - 126 U/L   Total Bilirubin 0.6 0.3 - 1.2 mg/dL   GFR, Estimated >60 >60 mL/min    Comment: (NOTE) Calculated using the CKD-EPI Creatinine Equation (2021)    Anion gap 13 5 - 15    Comment: Performed at [facility], [address], [place], [place] [address]  CBC with Differential     Status: Abnormal   Collection Time: 11/09/22 12:05 PM   Result Value Ref Range   WBC 20.4 (H) 4.0 - 10.5 K/uL   RBC 4.32 3.87 - 5.11 MIL/uL   Hemoglobin 12.9 12.0 - 15.0 g/dL   HCT 38.3 36.0 - 46.0 %   MCV 88.7 80.0 - 100.0 fL   MCH 29.9 26.0 - 34.0 pg   MCHC 33.7 30.0 - 36.0 g/dL   RDW 13.2 11.5 - 15.5 %   Platelets 289 150 - 400 K/uL   nRBC 0.0 0.0 - 0.2 %   Neutrophils Relative % 82 %   Neutro Abs 16.7 (H) 1.7 - 7.7 K/uL   Lymphocytes Relative 10 %   Lymphs Abs 2.0 0.7 - 4.0 K/uL   Monocytes Relative 7 %   Monocytes Absolute 1.5 (H) 0.1 - 1.0 K/uL   Eosinophils Relative 0 %   Eosinophils Absolute 0.0 0.0 - 0.5 K/uL   Basophils Relative 0 %   Basophils Absolute 0.0 0.0 - 0.1 K/uL   Immature Granulocytes 1 %   Abs Immature Granulocytes 0.16 (H) 0.00 - 0.07 K/uL    Comment: Performed at [facility], [address] [place], [place] [address]  hCG, serum, qualitative     Status: None   Collection Time: 11/09/22 12:05 PM  Result Value Ref Range   Preg, Serum NEGATIVE NEGATIVE    Comment:        THE SENSITIVITY OF THIS METHODOLOGY IS >10 mIU/mL. Performed at [facility], [address], [place], [place] [address]   Urinalysis, Routine w reflex microscopic -Urine, Clean Catch     Status: Abnormal   Collection Time: 11/09/22 12:10 PM  Result Value Ref Range   Color, Urine AMBER (A) YELLOW    Comment: BIOCHEMICALS MAY BE AFFECTED BY COLOR   APPearance CLOUDY (A) CLEAR   Specific Gravity, Urine 1.026 1.005 - 1.030   pH 6.0 5.0 - 8.0   Glucose, UA NEGATIVE NEGATIVE mg/dL   Hgb urine dipstick SMALL (A) NEGATIVE   Bilirubin Urine NEGATIVE NEGATIVE   Ketones, ur 20 (A) NEGATIVE mg/dL   Protein, ur 100 (A) NEGATIVE mg/dL   Nitrite NEGATIVE NEGATIVE   Leukocytes,Ua SMALL (A) NEGATIVE   RBC / HPF 6-10 0 - 5 RBC/hpf   WBC, UA 21-50 0 - 5 WBC/hpf   Bacteria, UA RARE (A) NONE SEEN   Squamous Epithelial / HPF 11-20 0 - 5 /HPF   Mucus PRESENT     Comment: Performed at [facility], [address] [place], [place] [address]  Lactic  acid, plasma     Status: None   Collection Time: 11/09/22  1:45 PM  Result Value Ref Range   Lactic Acid, Venous 1.3 0.5 - 1.9 mmol/L    Comment: Performed at [facility], [address], [place], [place] [address]  Blood culture (routine x 2)     Status: None (Preliminary result)   Collection Time: 11/09/22  1:45 PM   Specimen: BLOOD  Result Value Ref Range   Specimen Description BLOOD LEFT ARM    Special Requests      BOTTLES DRAWN AEROBIC AND ANAEROBIC Blood Culture adequate volume   Culture      NO GROWTH < 24 HOURS Performed at [facility], [address], [place], [place] [address]    Report Status PENDING   Blood culture (routine x 2)     Status: None (Preliminary result)   Collection Time: 11/09/22  1:50 PM   Specimen: BLOOD  Result Value Ref Range   Specimen Description BLOOD RIGHT ARM    Special Requests      BOTTLES DRAWN AEROBIC AND ANAEROBIC Blood Culture adequate volume   Culture      NO GROWTH < 24 HOURS Performed at [facility], [address] [place], [place] [address]    Report Status PENDING   Lactic acid, plasma     Status: None   Collection Time: 11/09/22  2:52 PM  Result Value Ref Range   Lactic Acid, Venous 1.3 0.5 - 1.9 mmol/L    Comment: Performed at [facility], [address] [place], [place] [address]  Aerobic/Anaerobic Culture w Gram Stain (surgical/deep wound)     Status: None (Preliminary result)   Collection Time: 11/09/22  5:52 PM   Specimen: PATH Other; Body Fluid  Result Value Ref Range   Specimen Description      ABSCESS FLUID PERIANAL Performed at [facility], [address] [place], [place] [address]    Special Requests      NONE Performed at [facility], [address], [place], [place] [address]    Gram Stain      FEW WBC PRESENT,BOTH PMN AND MONONUCLEAR FEW GRAM POSITIVE COCCI FEW GRAM POSITIVE RODS FEW GRAM NEGATIVE RODS    Culture      RARE ESCHERICHIA COLI SUSCEPTIBILITIES TO FOLLOW Performed at [facility], [place] [address], [place], [place] [address]    Report Status PENDING   Comprehensive metabolic panel     Status: Abnormal   Collection Time: 11/10/22  5:10 AM  Result Value Ref Range   Sodium 133 (L) 135 - 145 mmol/L   Potassium 3.2 (L) 3.5 - 5.1 mmol/L   Chloride 103 98 - 111 mmol/L   CO2 23 22 - 32 mmol/L   Glucose, Bld 119 (H) 70 - 99 mg/dL    Comment: Glucose reference range applies only to samples taken after fasting for at least 8 hours.   BUN 11 6 - 20 mg/dL   Creatinine, Ser 0.62 0.44 - 1.00 mg/dL   Calcium 7.9 (L) 8.9 - 10.3 mg/dL   Total Protein 6.4 (L) 6.5 - 8.1 g/dL   Albumin 2.9 (L) 3.5 - 5.0 g/dL   AST 19 15 - 41 U/L   ALT 14 0 - 44 U/L   Alkaline Phosphatase 48 38 - 126 U/L   Total Bilirubin 0.7 0.3 - 1.2 mg/dL   GFR, Estimated >60 >60 mL/min  Comment: (NOTE) Calculated using the CKD-EPI Creatinine Equation (2021)    Anion gap 7 5 - 15    Comment: Performed at [facility], [address] [place], [place] [address]  Magnesium     Status: None   Collection Time: 11/10/22  5:10 AM  Result Value Ref Range   Magnesium 1.7 1.7 - 2.4 mg/dL    Comment: Performed at [facility], [address] [place], [place] [address]  CBC with Differential/Platelet     Status: Abnormal   Collection Time: 11/10/22  5:10 AM  Result Value Ref Range   WBC 22.1 (H) 4.0 - 10.5 K/uL   RBC 3.61 (L) 3.87 - 5.11 MIL/uL   Hemoglobin 10.8 (L) 12.0 - 15.0 g/dL   HCT 32.0 (L) 36.0 - 46.0 %   MCV 88.6 80.0 - 100.0 fL   MCH 29.9 26.0 - 34.0 pg   MCHC 33.8 30.0 - 36.0 g/dL   RDW 13.4 11.5 - 15.5 %   Platelets 263 150 - 400 K/uL   nRBC 0.0 0.0 - 0.2 %   Neutrophils Relative % 82 %   Neutro Abs 18.1 (H) 1.7 - 7.7 K/uL   Lymphocytes Relative 11 %   Lymphs Abs 2.5 0.7 - 4.0 K/uL   Monocytes Relative 6 %   Monocytes Absolute 1.2 (H) 0.1 - 1.0 K/uL   Eosinophils Relative 0 %   Eosinophils Absolute 0.0 0.0 - 0.5 K/uL   Basophils Relative 0 %   Basophils Absolute 0.1 0.0 - 0.1 K/uL    Immature Granulocytes 1 %   Abs Immature Granulocytes 0.20 (H) 0.00 - 0.07 K/uL    Comment: Performed at [facility], [address] [place], [place] [address]  Sedimentation rate     Status: Abnormal   Collection Time: 11/10/22  8:20 AM  Result Value Ref Range   Sed Rate 67 (H) 0 - 22 mm/hr    Comment: Performed at [facility], [address] [place], [place] [address]  C-reactive protein     Status: Abnormal   Collection Time: 11/10/22  8:20 AM  Result Value Ref Range   CRP 28.3 (H) <1.0 mg/dL    Comment: Performed at [facility], [address] [place], [place] [address]    MICRO: FEW GRAM POSITIVE COCCI FEW GRAM POSITIVE RODS FEW GRAM NEGATIVE RODS   Culture RARE ESCHERICHIA COLI SUSCEPTIBILITIES TO FOLLOW   IMAGING: CT ABDOMEN PELVIS W CONTRAST  Result Date: 11/09/2022 CLINICAL DATA:  Abdominal pain, acute, nonlocalized.  Rectal pain. EXAM: CT ABDOMEN AND PELVIS WITH CONTRAST TECHNIQUE: Multidetector CT imaging of the abdomen and pelvis was performed using the standard protocol following bolus administration of intravenous contrast. RADIATION DOSE REDUCTION: This exam was performed according to the departmental dose-optimization program which includes automated exposure control, adjustment of the mA and/or kV according to patient size and/or use of iterative reconstruction technique. CONTRAST:  146m OMNIPAQUE IOHEXOL 300 MG/ML  SOLN COMPARISON:  06/05/2022 FINDINGS: Lower chest: Lung bases are clear and normal. Hepatobiliary: Liver parenchyma is normal.  No calcified gallstones. Pancreas: Normal Spleen: Normal Adrenals/Urinary Tract: Adrenal glands are normal. Kidneys are normal. No mass, stone or hydronephrosis. Bladder is normal. Stomach/Bowel: Stomach and small intestine are normal. Normal appendix. Normal colon. Vascular/Lymphatic: Aorta and IVC are normal.  No adenopathy. Reproductive: No pelvic mass. Other: There is a circular radiopaque structure within the soft  tissues of the medial left buttock fold, presumably placed surgically since the prior CT of 06/05/2022. Extensive  recurrent abscess within the medial fat of the left buttock emanating outward from that, measuring approximately 7 x 3.5 x 3.5 cm. Second component of soft tissue infection extending upward in the midline, abutting the tip of the coccyx. When reviewing the appearance of the coccyx over studies going back as distant as 10-Apr-2010, I think there is erosion of the distal coccygeal segment suggesting coccygeal osteomyelitis. Abnormal fluid does surround the last coccygeal segment. Musculoskeletal: Otherwise there are mild chronic degenerative changes in the lower lumbar spine. IMPRESSION: 7 x 3.5 x 3.5 cm soft tissue abscess in the medial left buttock fold emanating outward from a metallic density loop wire presumably placed surgically. Second component of soft tissue infection extending upward in the midline, abutting the tip of the coccyx. When reviewing the appearance of the coccyx over studies going back as distant as 04/10/2010, I think there is erosion of the distal coccygeal segment suggesting coccygeal osteomyelitis. Abnormal fluid does surround the last coccygeal segment. Electronically Signed   By: Nelson Chimes M.D.   On: 11/09/2022 13:55     Assessment/Plan:  32yo F with history of anal fistula with recurrent perirectal abscess and coccygeal osteomyelitis  - will recommend to change abtx to ceftriaxone 2gm IV daily, metronidazole '500mg'$  po bid plus linezolid '600mg'$  po bid - will follow cultures to see if still need linezolid (anti-staphylococcus coverage) - recommend to get picc line to do 2-4 wk of iv abtx then convert to oral therapy. - will provider further recs tomorrow.  Elzie Rings [facility] [phone]

## 2022-11-11 ENCOUNTER — Inpatient Hospital Stay: Payer: Self-pay

## 2022-11-11 ENCOUNTER — Encounter (HOSPITAL_COMMUNITY): Payer: Self-pay | Admitting: Family Medicine

## 2022-11-11 DIAGNOSIS — K611 Rectal abscess: Secondary | ICD-10-CM | POA: Diagnosis not present

## 2022-11-11 LAB — BASIC METABOLIC PANEL
Anion gap: 7 (ref 5–15)
BUN: 9 mg/dL (ref 6–20)
CO2: 24 mmol/L (ref 22–32)
Calcium: 7.9 mg/dL — ABNORMAL LOW (ref 8.9–10.3)
Chloride: 102 mmol/L (ref 98–111)
Creatinine, Ser: 0.57 mg/dL (ref 0.44–1.00)
GFR, Estimated: >60 mL/min (ref >60)
Glucose, Bld: 88 mg/dL (ref 70–99)
Potassium: 3.4 mmol/L — ABNORMAL LOW (ref 3.5–5.1)
Sodium: 133 mmol/L — ABNORMAL LOW (ref 135–145)

## 2022-11-11 LAB — CBC
HCT: 32.7 % — ABNORMAL LOW (ref 36.0–46.0)
Hemoglobin: 10.9 g/dL — ABNORMAL LOW (ref 12.0–15.0)
MCH: 30 pg (ref 26.0–34.0)
MCHC: 33.3 g/dL (ref 30.0–36.0)
MCV: 90.1 fL (ref 80.0–100.0)
Platelets: 276 10*3/uL (ref 150–400)
RBC: 3.63 MIL/uL — ABNORMAL LOW (ref 3.87–5.11)
RDW: 13.4 % (ref 11.5–15.5)
WBC: 13.2 10*3/uL — ABNORMAL HIGH (ref 4.0–10.5)
nRBC: 0 % (ref 0.0–0.2)

## 2022-11-11 LAB — URINE CULTURE: Culture: >=100000 — AB

## 2022-11-11 LAB — MAGNESIUM: Magnesium: 2.3 mg/dL (ref 1.7–2.4)

## 2022-11-11 MED ORDER — OXYCODONE HCL 5 MG PO TABS: 5.0000 mg | ORAL_TABLET | ORAL | 0 refills | Status: DC | PRN

## 2022-11-11 MED ORDER — SENNOSIDES-DOCUSATE SODIUM 8.6-50 MG PO TABS: 1.0000 | ORAL_TABLET | Freq: Two times a day (BID) | ORAL | 0 refills | Status: DC

## 2022-11-11 MED ORDER — SODIUM CHLORIDE 0.9% FLUSH: 10.0000 mL | Freq: Two times a day (BID) | INTRAVENOUS | Status: DC

## 2022-11-11 MED ORDER — CEFTRIAXONE IV (FOR PTA / DISCHARGE USE ONLY): 2.0000 g | INTRAVENOUS | 0 refills | Status: AC

## 2022-11-11 MED ORDER — METRONIDAZOLE 500 MG PO TABS: 500.0000 mg | ORAL_TABLET | Freq: Two times a day (BID) | ORAL | 0 refills | Status: AC

## 2022-11-11 MED ORDER — POTASSIUM CHLORIDE CRYS ER 20 MEQ PO TBCR
40.0000 meq | EXTENDED_RELEASE_TABLET | Freq: Once | ORAL | Status: AC
  Administered 2022-11-11: 40 meq via ORAL
  Filled 2022-11-11: qty 2

## 2022-11-11 MED ORDER — CHLORHEXIDINE GLUCONATE CLOTH 2 % EX PADS: 6.0000 | MEDICATED_PAD | Freq: Every day | CUTANEOUS | Status: DC

## 2022-11-11 MED ORDER — POLYETHYLENE GLYCOL 3350 17 G PO PACK: 17.0000 g | PACK | Freq: Every day | ORAL | 0 refills | Status: DC

## 2022-11-11 MED ORDER — SODIUM CHLORIDE 0.9% FLUSH: 10.0000 mL | INTRAVENOUS | Status: DC | PRN

## 2022-11-11 MED ORDER — POTASSIUM CHLORIDE CRYS ER 20 MEQ PO TBCR: 20.0000 meq | EXTENDED_RELEASE_TABLET | Freq: Two times a day (BID) | ORAL | 0 refills | Status: DC

## 2022-11-11 NOTE — Progress Notes (Signed)
[facility] Progress Note  2 Days Post-Op  Subjective: Patient seen and examined.  She is resting comfortably in bed.  She is feeling much better today, with significant improvement in her pain.  She is flushing the drains and cleaning the area off using a shower.  She tolerated diet without issue.  Objective: Vital signs in last 24 hours: Temp:  [98 F (36.7 C)-98.9 F (37.2 C)] 98.9 F (37.2 C) (02/27 2103) Pulse Rate:  [91-95] 95 (02/27 2103) Resp:  [16] 16 (02/27 2103) BP: (112-113)/(64-72) 113/64 (02/27 2103) SpO2:  [97 %] 97 % (02/27 2103) Last BM Date : 11/05/22  Intake/Output from previous day: 02/27 0701 - 02/28 0700 In: 2020 [P.O.:1320; IV Piggyback:700] Out: -  Intake/Output this shift: No intake/output data recorded.  General appearance: alert, cooperative, and no distress GI: Abdomen soft, nondistended, nontender to palpation; improving perirectal erythema, induration, and tenderness, Penrose drains in incisions with serosanguineous drainage on gauze  Lab Results:  Recent Labs    11/10/22 0510 11/11/22 0508  WBC 22.1* 13.2*  HGB 10.8* 10.9*  HCT 32.0* 32.7*  PLT 263 276   BMET Recent Labs    11/10/22 0510 11/11/22 0508  NA 133* 133*  K 3.2* 3.4*  CL 103 102  CO2 23 24  GLUCOSE 119* 88  BUN 11 9  CREATININE 0.62 0.57  CALCIUM 7.9* 7.9*   PT/INR No results for input(s): "LABPROT", "INR" in the last 72 hours.  Studies/Results: CT ABDOMEN PELVIS W CONTRAST  Result Date: 11/09/2022 CLINICAL DATA:  Abdominal pain, acute, nonlocalized.  Rectal pain. EXAM: CT ABDOMEN AND PELVIS WITH CONTRAST TECHNIQUE: Multidetector CT imaging of the abdomen and pelvis was performed using the standard protocol following bolus administration of intravenous contrast. RADIATION DOSE REDUCTION: This exam was performed according to the departmental dose-optimization program which includes automated exposure control, adjustment of the mA and/or kV according  to patient size and/or use of iterative reconstruction technique. CONTRAST:  143m OMNIPAQUE IOHEXOL 300 MG/ML  SOLN COMPARISON:  06/05/2022 FINDINGS: Lower chest: Lung bases are clear and normal. Hepatobiliary: Liver parenchyma is normal.  No calcified gallstones. Pancreas: Normal Spleen: Normal Adrenals/Urinary Tract: Adrenal glands are normal. Kidneys are normal. No mass, stone or hydronephrosis. Bladder is normal. Stomach/Bowel: Stomach and small intestine are normal. Normal appendix. Normal colon. Vascular/Lymphatic: Aorta and IVC are normal.  No adenopathy. Reproductive: No pelvic mass. Other: There is a circular radiopaque structure within the soft tissues of the medial left buttock fold, presumably placed surgically since the prior CT of 06/05/2022. Extensive recurrent abscess within the medial fat of the left buttock emanating outward from that, measuring approximately 7 x 3.5 x 3.5 cm. Second component of soft tissue infection extending upward in the midline, abutting the tip of the coccyx. When reviewing the appearance of the coccyx over studies going back as distant as 02011/07/27 I think there is erosion of the distal coccygeal segment suggesting coccygeal osteomyelitis. Abnormal fluid does surround the last coccygeal segment. Musculoskeletal: Otherwise there are mild chronic degenerative changes in the lower lumbar spine. IMPRESSION: 7 x 3.5 x 3.5 cm soft tissue abscess in the medial left buttock fold emanating outward from a metallic density loop wire presumably placed surgically. Second component of soft tissue infection extending upward in the midline, abutting the tip of the coccyx. When reviewing the appearance of the coccyx over studies going back as distant as 02011-07-27 I think there is erosion of the distal coccygeal segment suggesting coccygeal osteomyelitis. Abnormal fluid does  surround the last coccygeal segment. Electronically Signed   By: Nelson Chimes M.D.   On: 11/09/2022 13:55     Anti-infectives: Anti-infectives (From admission, onward)    Start     Dose/Rate Route Frequency Ordered Stop   11/10/22 2200  metroNIDAZOLE (FLAGYL) tablet 500 mg        500 mg Oral Every 12 hours 11/10/22 1624     11/09/22 2100  cefTRIAXone (ROCEPHIN) 2 g in sodium chloride 0.9 % 100 mL IVPB        2 g 200 mL/hr over 30 Minutes Intravenous Every 24 hours 11/09/22 1958     11/09/22 1800  linezolid (ZYVOX) IVPB 600 mg        600 mg 300 mL/hr over 60 Minutes Intravenous Every 12 hours 11/09/22 1700     11/09/22 1315  piperacillin-tazobactam (ZOSYN) IVPB 3.375 g        3.375 g 100 mL/hr over 30 Minutes Intravenous  Once 11/09/22 1303 11/09/22 1428       Assessment/Plan:  Patient is a 32 year old female who is admitted with perirectal abscess and possible coccyx osteomyelitis.  Status post I&D of perirectal abscess x 2 with Penrose drain placement on 2/26  -ID recommending PICC line placement with plan for IV antibiotics for 2 to 4 weeks to treat possible coccyx osteomyelitis -Rocephin, Flagyl, and linezolid -Await final cultures; Gram stain with gram-positive cocci, gram-positive rods, and gram-negative rods, initial culture with rare E. coli -Regular diet -Flush Penrose drains twice daily -Sitz bath's 4 times daily and as needed after Bms -Bowel regimen -PRN pain control and antiemetics -Appreciate hospitalist and ID recommendations -Discussed patient with Dr. Marcello Moores yesterday.  Dr. Marcello Moores will have her follow-up with her likely next week and discuss potential exam under anesthesia.   LOS: 2 days    Brette Cast A Chip [place] 11/11/2022

## 2022-11-11 NOTE — TOC Initial Note (Addendum)
Transition of Care [facility]) - Initial/Assessment Note    Patient Details  Name: Rachel Sellers MRN: [id number] Date of Birth: 09/30/1990  [facility] CM/SW Contact:    Salome Arnt, [place] Phone Number: 11/11/2022, 1:15 PM  Clinical Narrative:  Pt will need 2-4 weeks IV antibiotics. LCSW discussed with pt who reports her significant other can help. Pam with [employer] notified and will follow. Pam will refer to [place] for [facility]. Possible d/c tomorrow after PICC placed. TOC will follow.    Expected Discharge Plan: [place] Barriers to Discharge: Continued Medical Work up   Patient Goals and CMS Choice Patient states their goals for this hospitalization and ongoing recovery are:: to get better   Choice offered to / list presented to : Patient [place] ownership interest in [facility].provided to::  (n/a)    Expected Discharge Plan and Services In-house Referral: Clinical Social Work   Post Acute Care Choice: [place] arrangements for the past 2 months: [place]: RN   Date [place]: 11/11/22 Time [place]: 14 Representative spoke with at [place]: [place] with [employer] referred to [place]  Prior Living Arrangements/Services Living arrangements for the past 2 months: Single Family Home Lives with:: Significant Other Patient language and need for interpreter reviewed:: Yes Do you feel safe going back to the place where you live?: Yes      Need for Family Participation in Patient Care: Yes (Comment) Care giver support system in place?: Yes (comment)   Criminal Activity/Legal Involvement Pertinent to Current Situation/Hospitalization: No - Comment as needed  Activities of Daily Living Home Assistive Devices/Equipment: None ADL Screening (condition at time of admission) Patient's cognitive ability adequate to safely complete daily  activities?: Yes Is the patient deaf or have difficulty hearing?: No Does the patient have difficulty seeing, even when wearing glasses/contacts?: No Does the patient have difficulty concentrating, remembering, or making decisions?: No Patient able to express need for assistance with ADLs?: No Does the patient have difficulty dressing or bathing?: No Independently performs ADLs?: Yes (appropriate for developmental age) Does the patient have difficulty walking or climbing stairs?: No Weakness of Legs: None Weakness of Arms/Hands: None  Permission Sought/Granted                  Emotional Assessment Appearance:: Appears stated age   Affect (typically observed): Appropriate Orientation: : Oriented to Self, Oriented to Place, Oriented to  Time, Oriented to Situation Alcohol / Substance Use: Not Applicable Psych Involvement: No (comment)  Admission diagnosis:  Perianal abscess [K61.0] Osteomyelitis ([place]) [M86.9] 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[place]) [M46.28] Patient Active Problem List   Diagnosis Date Noted   Perianal abscess 11/09/2022   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[place]) 11/09/2022   UTI (urinary tract infection) 11/09/2022   SIRS (systemic inflammatory response syndrome) ([place]) 11/09/2022   Osteomyelitis ([place]) 11/09/2022   Anxiety and depression 09/20/2022   Insomnia 09/20/2022   Transsphincteric anal fistula 07/23/2022   Hypokalemia 06/06/2022   Perirectal abscess    PCP:  Coral Spikes, DO Pharmacy:   [facility] [address] [place] - [address] [place] [address] Phone: [phone] Fax: [phone]     Social Determinants of Health (SDOH) Social History: [place]  Insecurity: No Food Insecurity (11/09/2022)  Housing: Low Risk  (11/09/2022)  Transportation Needs: No Transportation Needs (11/09/2022)  Utilities: Not At Risk (11/09/2022)  Depression (PHQ2-9): High Risk (10/09/2022)  Tobacco Use: High Risk  (11/11/2022)   SDOH Interventions:     Readmission Risk Interventions     No data to display

## 2022-11-11 NOTE — Progress Notes (Addendum)
Patient prefers to rinse in the shower as opposed to sitz baths and also prefers to change her own dressings, and refused having drains flushed until ready to change the dressing. Gauze and peripads provided.

## 2022-11-11 NOTE — Progress Notes (Signed)
ID PROGRESS NOTE  Plan for 2 wks of ceftriaxone iv 2gm IV daily plus oral metronidazole '500mg'$  po bid. Can discontinue linezolid.  Will see in the ID clinic to extend oral abtx (for additional 4 wk) and assess how she is doing.  Diagnosis: Coccygeal osteomyelitis  Culture Result: ecoli  Allergies  Allergen Reactions   Gentamicin Other (See Comments)    Renal issues.   Vancomycin Other (See Comments)    Kidney failure    OPAT Orders Discharge antibiotics to be given via PICC line Discharge antibiotics: Per pharmacy protocol: ceftriaxone 2gm iv daily  Duration: 2 wk End Date: 11/22/2022  [facility] Care Per Protocol:  Home health RN for IV administration and teaching; PICC line care and labs.    Labs weekly while on IV antibiotics: __x CBC with differential _x_ BMP   _x_ Please pull PIC at completion of IV antibiotics  Fax weekly labs to [phone]  Clinic Follow Up Appt: In 2 wk  @ [place] [phone]

## 2022-11-11 NOTE — TOC Transition Note (Addendum)
Transition of Care [facility]) - CM/SW Discharge Note   Patient Details  Name: [name] MRN: [id number] Date of Birth: [birth year]  Transition of Care [facility]) CM/SW Contact:  Salome Arnt, LCSW Phone Number: 11/11/2022, 2:59 PM   Clinical Narrative:  Pt d/c today. Pam with [employer] notified and will do teaching later this afternoon. Pt and MD updated.     Final next level of care: [place] Barriers to Discharge: Barriers Resolved   Patient Goals and CMS Choice   Choice offered to / list presented to : Patient  Discharge Placement                      Patient and family notified of of transfer: 11/11/22  Discharge Plan and Services Additional resources added to the After Visit Summary for   In-house Referral: Clinical Social Work   Post Acute Care Choice: Home Health                    HH Arranged: RN   Date [facility] Agency Contacted: 11/11/22 Time HH Agency Contacted: [id number] Representative spoke with at [place]: [place] with [employer] referred to [employer]  Social Determinants of Health (SDOH) Interventions SDOH Screenings   Food Insecurity: No Food Insecurity (11/09/2022)  Housing: Low Risk  (11/09/2022)  Transportation Needs: No Transportation Needs (11/09/2022)  Utilities: Not At Risk (11/09/2022)  Depression (PHQ2-9): High Risk (10/09/2022)  Tobacco Use: High Risk (11/11/2022)     Readmission Risk Interventions     No data to display

## 2022-11-11 NOTE — Progress Notes (Signed)
Peripherally Inserted Central Catheter Placement  The IV Nurse has discussed with the patient and/or persons authorized to consent for the patient, the purpose of this procedure and the potential benefits and risks involved with this procedure.  The benefits include less needle sticks, lab draws from the catheter, and the patient may be discharged home with the catheter. Risks include, but not limited to, infection, bleeding, blood clot (thrombus formation), and puncture of an artery; nerve damage and irregular heartbeat and possibility to perform a PICC exchange if needed/ordered by physician.  Alternatives to this procedure were also discussed.  Bard Power PICC patient education guide, fact sheet on infection prevention and patient information card has been provided to patient /or left at bedside.    PICC Placement Documentation  PICC Single Lumen Q000111Q Right Basilic 39 cm (Active)  Indication for Insertion or Continuance of Line Home intravenous therapies (PICC only) 11/11/22 1326  Exposed Catheter (cm) 1 cm 11/11/22 1326  Site Assessment Clean, Dry, Intact 11/11/22 1326  Line Status Flushed;Saline locked;Blood return noted 11/11/22 1326  Dressing Type Transparent;Securing device 11/11/22 1326  Dressing Status Antimicrobial disc in place 11/11/22 1326  Safety Lock Not Applicable Q000111Q [id number]  Line Adjustment (NICU/IV Team Only) No 11/11/22 1326  Dressing Change Due 11/18/22 11/11/22 [place] 11/11/2022, 1:29 PM

## 2022-11-11 NOTE — Progress Notes (Addendum)
Nutrition Brief Note  Patient identified on the Malnutrition Screening Tool (MST) Report  Patient presents with abdominal pain. 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   2/26- status post incision and drainage of perirectal abscess   Wt Readings from Last 15 Encounters:  11/10/22 87.4 kg  11/07/22 88.5 kg  10/09/22 88 kg  09/18/22 88.2 kg  09/16/22 88 kg  08/26/22 90.3 kg  08/11/22 88.5 kg  07/30/22 89.4 kg  07/23/22 89.4 kg  07/15/22 87.1 kg  06/30/22 85.3 kg  06/16/22 85.7 kg  06/09/22 85.7 kg  06/06/22 86.6 kg  06/05/22 85.3 kg    Body mass index is 34.13 kg/m. Patient meets criteria for obese based on current BMI. Minimal weight change per history.  Current diet order is regular, patient is consuming approximately 50-75% of meals at this time. Talked with patient and encouraged her to eat protein with each meal.   Labs and medications reviewed.   No nutrition interventions warranted at this time. If nutrition issues arise, please consult RD.   Colman Cater MS,RD,CSG,LDN Office: [phone]

## 2022-11-11 NOTE — Discharge Summary (Signed)
Physician Discharge Summary  Rachel Sellers [id number] DOB: [birth year] DOA: 11/09/2022  PCP: Coral Spikes, DO  Admit date: 11/09/2022 Discharge date: 11/11/2022  Admitted From: Home Disposition:  Home  Discharge Condition:Stable CODE STATUS:FULL Diet recommendation: Regular    Brief/Interim Summary: Rachel Sellers is a 32 y.o. female with past medical history significant for anxiety/depression, history of perirectal abscess s/p I&D 2017 with Dr. Arnoldo Morale and 2023 with Dr. 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 Constance Haw who presented to [facility] ED on 2/26 with buttock pain with concern for recurrent abscess.  Patient reports last bowel movement 3 days prior.  Also reports difficulty urinating due to pain.  Denies fever/chills.  Patient was recently referred to colorectal surgery, Dr. Leighton Ruff and was scheduled to have a LIFT procedure next week.  On presentation,,she was tachycardic with elevated leukocytes,.  CT abdomen/pelvis showed 7 x 3.5 x 3.5 cm soft tissue abscess medial left buttock fold emanating outward from a metallic density loop wire with soft tissue infection, concern for erosion of the distal coccygeal segment consistent with coccygeal osteomyelitis. Urine culture and blood cultures obtained. General surgery consulted.  Underwent incision and drainage of peritoneal abscess, placement of Penrose drain.  ID was also consulted. Started on broad spectrum antibiotics.  Leukocytosis gradually improved.  Currently she is afebrile, hemodynamically stable.  General surgery cleared for discharge.  ID recommended 2 weeks of IV antibiotics along with oral Flagyl and outpatient follow-up.  PICC line placed today.Medically stable for dc  Following problems were addressed during the hospitalization:  Sepsis, POA Perirectal abscess Coccygeal osteomyelitis Patient presenting to the ED with progressive buttock pain was found to have elevated  WBC count of 20.4, tachycardic.  CT abdomen/pelvis with 7 x 3.5 x 3.5 cm soft tissue abscess medial left buttock with concern for erosion of the distal coccygeal segment consistent with osteomyelitis.  General surgery was consulted and patient underwent I&D perirectal abscess by Dr. Okey Dupre on 11/09/2022.   ID recommended 2 weeks of IV antibiotics along with oral Flagyl and outpatient follow-up.  PICC line placed today. She will follow-up with general surgery, ID as an outpatient.   Hypokalemia Supplemented   Depression/anxiety: On Sertraline 50 mg p.o. daily   Morbid obesity Body mass index is 34.13 kg/m.   Discharge Diagnoses:  Active Problems:   Perirectal abscess   Hypokalemia   Anxiety and depression   Perianal abscess   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HCC)   UTI (urinary tract infection)   SIRS (systemic inflammatory response syndrome) ([place])   Osteomyelitis [facility])    Discharge Instructions  Discharge Instructions     [facility] pharmacist to adjust dose for Vancomycin, Aminoglycosides and other anti-infective therapies as requested by physician.   Complete by: As directed    [facility] to provide Cath Flo '2mg'$    Complete by: As directed    Administer for PICC line occlusion and as ordered by physician for other access device issues.   Anaphylaxis Kit: Provided to treat any anaphylactic reaction to the medication being provided to the patient if First Dose or when requested by physician   Complete by: As directed    Epinephrine '1mg'$ /ml vial / amp: Administer 0.'3mg'$  (0.52m) subcutaneously once for moderate to severe anaphylaxis, nurse to call physician and pharmacy when reaction occurs and call 911 if needed for immediate care   Diphenhydramine '50mg'$ /ml IV vial: Administer 25-'50mg'$  IV/IM PRN for first dose reaction, rash, itching,  mild reaction, nurse to call physician and pharmacy when reaction occurs   Sodium Chloride 0.9% NS 517m IV: Administer if  needed for hypovolemic blood pressure drop or as ordered by physician after call to physician with anaphylactic reaction   Change dressing on IV access line weekly and PRN   Complete by: As directed    Diet - low sodium heart healthy   Complete by: As directed    Discharge instructions   Complete by: As directed    1)Please take prescribed medications as instructed 2)Please follow up infectious disease and general surgery as an outpatient 3)Follow up with your PCP in a week, do a CBC, BMP test during the follow-up   Flush IV access with Sodium Chloride 0.9% and Heparin 10 units/ml or 100 units/ml   Complete by: As directed    Home infusion instructions - [facility]   Complete by: As directed    Instructions: Flush IV access with Sodium Chloride 0.9% and Heparin 10units/ml or 100units/ml   Change dressing on IV access line: Weekly and PRN   Instructions Cath Flo '2mg'$ : Administer for PICC Line occlusion and as ordered by physician for other access device   [facility] pharmacist to adjust dose for: Vancomycin, Aminoglycosides and other anti-infective therapies as requested by physician   Increase activity slowly   Complete by: As directed    Method of administration may be changed at the discretion of home infusion pharmacist based upon assessment of the patient and/or caregiver's ability to self-administer the medication ordered   Complete by: As directed    No wound care   Complete by: As directed    Outpatient Parenteral Antibiotic Therapy Information Antibiotic: Ceftriaxone (Rocephin) IVPB; Indications for use: peri-rectal abscess / osteomyelitis; End Date: 11/22/2022   Complete by: As directed    Antibiotic: Ceftriaxone (Rocephin) IVPB   Indications for use: peri-rectal abscess / osteomyelitis   End Date: 11/22/2022      Allergies as of 11/11/2022       Reactions   Gentamicin Other (See Comments)   Renal issues.   Vancomycin Other (See Comments)   Kidney  failure        Medication List     TAKE these medications    cefTRIAXone  IVPB Commonly known as: ROCEPHIN Inject 2 g into the vein daily for 11 days. Indication:  peri-rectal abscess / osteomyelitis First Dose: No Last Day of Therapy:  11/22/22 Labs - Once weekly:  CBC/D and BMP, Labs - Every other week:  ESR and CRP Method of administration: IV Push Method of administration may be changed at the discretion of home infusion pharmacist based upon assessment of the patient and/or caregiver's ability to self-administer the medication ordered.   cyclobenzaprine 5 MG tablet Commonly known as: FLEXERIL Take 1 tablet (5 mg total) by mouth 2 (two) times daily as needed for muscle spasms.   ketorolac 10 MG tablet Commonly known as: TORADOL Take 1 tablet (10 mg total) by mouth every 6 (six) hours as needed (do not take other NSAIDs with this medication).   lidocaine 4 % Commonly known as: HM Lidocaine Patch Place 1 patch onto the skin daily.   metroNIDAZOLE 500 MG tablet Commonly known as: Flagyl Take 1 tablet (500 mg total) by mouth 2 (two) times daily for 14 days.   oxyCODONE 5 MG immediate release tablet Commonly known as: Oxy IR/ROXICODONE Take 1 tablet (5 mg total) by mouth every 4 (four) hours as needed for moderate  pain.   polyethylene glycol 17 g packet Commonly known as: MIRALAX / GLYCOLAX Take 17 g by mouth daily. Start taking on: November 12, 2022   potassium chloride SA 20 MEQ tablet Commonly known as: KLOR-CON M Take 1 tablet (20 mEq total) by mouth 2 (two) times daily for 3 days.   senna-docusate 8.6-50 MG tablet Commonly known as: Senokot-S Take 1 tablet by mouth 2 (two) times daily.   sertraline 50 MG tablet Commonly known as: ZOLOFT Take 1 tablet (50 mg total) by mouth daily. After 1 week, increase to 100 mg daily (2 tablets). What changed:  how much to take additional instructions   traZODone 50 MG tablet Commonly known as: DESYREL Take 1 tablet  (50 mg total) by mouth at bedtime as needed for sleep.               Discharge Care Instructions  (From admission, onward)           Start     Ordered   11/11/22 0000  Change dressing on IV access line weekly and PRN  (Home infusion instructions - [facility] )        11/11/22 1437            Follow-up Information     Pappayliou, Flint Melter, DO. Call.   Specialty: General Surgery Why: As needed Contact information: [address]na [place] [facility] [address] [phone]                Allergies  Allergen Reactions   Gentamicin Other (See Comments)    Renal issues.   Vancomycin Other (See Comments)    Kidney failure    Consultations: ID,Surgery   Procedures/Studies: [place] EKG SITE RITE  Result Date: 11/11/2022 If Site Rite image not attached, placement could not be confirmed due to current cardiac rhythm.  [place] EKG SITE RITE  Result Date: 11/11/2022 If [facility] image not attached, placement could not be confirmed due to current cardiac rhythm.  CT ABDOMEN PELVIS W CONTRAST  Result Date: 11/09/2022 CLINICAL DATA:  Abdominal pain, acute, nonlocalized.  Rectal pain. EXAM: CT ABDOMEN AND PELVIS WITH CONTRAST TECHNIQUE: Multidetector CT imaging of the abdomen and pelvis was performed using the standard protocol following bolus administration of intravenous contrast. RADIATION DOSE REDUCTION: This exam was performed according to the departmental dose-optimization program which includes automated exposure control, adjustment of the mA and/or kV according to patient size and/or use of iterative reconstruction technique. CONTRAST:  152m OMNIPAQUE IOHEXOL 300 MG/ML  SOLN COMPARISON:  06/05/2022 FINDINGS: Lower chest: Lung bases are clear and normal. Hepatobiliary: Liver parenchyma is normal.  No calcified gallstones. Pancreas: Normal Spleen: Normal Adrenals/Urinary Tract: Adrenal glands are normal. Kidneys are normal. No mass, stone or hydronephrosis.  Bladder is normal. Stomach/Bowel: Stomach and small intestine are normal. Normal appendix. Normal colon. Vascular/Lymphatic: Aorta and IVC are normal.  No adenopathy. Reproductive: No pelvic mass. Other: There is a circular radiopaque structure within the soft tissues of the medial left buttock fold, presumably placed surgically since the prior CT of 06/05/2022. Extensive recurrent abscess within the medial fat of the left buttock emanating outward from that, measuring approximately 7 x 3.5 x 3.5 cm. Second component of soft tissue infection extending upward in the midline, abutting the tip of the coccyx. When reviewing the appearance of the coccyx over studies going back as distant as 007-30-11 I think there is erosion of the distal coccygeal segment suggesting coccygeal osteomyelitis. Abnormal fluid does surround the last  coccygeal segment. Musculoskeletal: Otherwise there are mild chronic degenerative changes in the lower lumbar spine. IMPRESSION: 7 x 3.5 x 3.5 cm soft tissue abscess in the medial left buttock fold emanating outward from a metallic density loop wire presumably placed surgically. Second component of soft tissue infection extending upward in the midline, abutting the tip of the coccyx. When reviewing the appearance of the coccyx over studies going back as distant as 2010-04-24, I think there is erosion of the distal coccygeal segment suggesting coccygeal osteomyelitis. Abnormal fluid does surround the last coccygeal segment. Electronically Signed   By: Nelson Chimes M.D.   On: 11/09/2022 13:55      Subjective: Patient seen and examined at bedside today.  Hemodynamically stable for discharge.  Desperate  to go home.  Discharge Exam: Vitals:   11/10/22 1512 11/10/22 2103  BP: 112/72 113/64  Pulse: 91 95  Resp: 16 16  Temp: 98 F (36.7 C) 98.9 F (37.2 C)  SpO2: 97% 97%   Vitals:   11/10/22 0537 11/10/22 0756 11/10/22 1512 11/10/22 2103  BP: 114/66 107/66 112/72 113/64  Pulse: 97  99 91 95  Resp: '18 16 16 16  '$ Temp: 98.7 F (37.1 C) 99.2 F (37.3 C) 98 F (36.7 C) 98.9 F (37.2 C)  TempSrc:  Oral Oral Oral  SpO2: 99% 99% 97% 97%  Weight:      Height:        General: Pt is alert, awake, not in acute distress,obese Cardiovascular: RRR, S1/S2 +, no rubs, no gallops Respiratory: CTA bilaterally, no wheezing, no rhonchi Abdominal: Soft, NT, ND, bowel sounds + Extremities: no edema, no cyanosis    The results of significant diagnostics from this hospitalization (including imaging, microbiology, ancillary and laboratory) are listed below for reference.     Microbiology: Recent Results (from the past 240 hour(s))  Urine Culture (for pregnant, neutropenic or urologic patients or patients with an indwelling urinary catheter)     Status: Abnormal   Collection Time: 11/09/22 12:10 PM   Specimen: Urine, Clean Catch  Result Value Ref Range Status   Specimen Description   Final    URINE, CLEAN CATCH Performed at [facility], [address] [place], [place] [address]    Special Requests   Final    NONE Performed at [facility], [address] [place], [place] [address]    Culture (A)  Final    >=100,000 COLONIES/mL LACTOBACILLUS SPECIES Standardized susceptibility testing for this organism is not available. Performed at [facility], [place] [address] [place], [place] [address]    Report Status 11/11/2022 FINAL  Final  Blood culture (routine x 2)     Status: None (Preliminary result)   Collection Time: 11/09/22  1:45 PM   Specimen: BLOOD  Result Value Ref Range Status   Specimen Description BLOOD LEFT ARM  Final   Special Requests   Final    BOTTLES DRAWN AEROBIC AND ANAEROBIC Blood Culture adequate volume   Culture   Final    NO GROWTH 2 DAYS Performed at [facility], [address] [place], [place] [address]    Report Status PENDING  Incomplete  Blood culture (routine x 2)     Status: None (Preliminary result)   Collection Time: 11/09/22  1:50  PM   Specimen: BLOOD  Result Value Ref Range Status   Specimen Description BLOOD RIGHT ARM  Final   Special Requests   Final    BOTTLES DRAWN AEROBIC AND ANAEROBIC Blood Culture  adequate volume   Culture   Final    NO GROWTH 2 DAYS Performed at [facility], [address], [place], [place] [address]    Report Status PENDING  Incomplete  Aerobic/Anaerobic Culture w Gram Stain (surgical/deep wound)     Status: None (Preliminary result)   Collection Time: 11/09/22  5:52 PM   Specimen: PATH Other; Body Fluid  Result Value Ref Range Status   Specimen Description   Final    ABSCESS FLUID PERIANAL Performed at [facility], [address], [place], [place] [address]    Special Requests   Final    NONE Performed at [facility], [address] [place], [place] [address]    Gram Stain   Final    FEW WBC PRESENT,BOTH PMN AND MONONUCLEAR FEW GRAM POSITIVE COCCI FEW GRAM POSITIVE RODS FEW GRAM NEGATIVE RODS    Culture   Final    RARE ESCHERICHIA COLI HOLDING FOR POSSIBLE ANAEROBE Performed at [facility], [place] [address], [place], [place] [address]    Report Status PENDING  Incomplete   Organism ID, Bacteria ESCHERICHIA COLI  Final      Susceptibility   Escherichia coli - MIC*    AMPICILLIN <=2 SENSITIVE Sensitive     CEFEPIME <=0.12 SENSITIVE Sensitive     CEFTAZIDIME <=1 SENSITIVE Sensitive     CEFTRIAXONE <=0.25 SENSITIVE Sensitive     CIPROFLOXACIN <=0.25 SENSITIVE Sensitive     GENTAMICIN <=1 SENSITIVE Sensitive     IMIPENEM <=0.25 SENSITIVE Sensitive     TRIMETH/SULFA <=20 SENSITIVE Sensitive     AMPICILLIN/SULBACTAM <=2 SENSITIVE Sensitive     PIP/TAZO <=4 SENSITIVE Sensitive     * RARE ESCHERICHIA COLI     Labs: BNP (last 3 results) No results for input(s): "BNP" in the last 8760 hours. Basic Metabolic Panel: Recent Labs  Lab 11/09/22 1205 11/10/22 0510 11/11/22 0508  NA 137 133* 133*  K 3.1* 3.2* 3.4*  CL 101 103 102  CO2 '23 23 24  '$ GLUCOSE 100* 119*  88  BUN '16 11 9  '$ CREATININE 0.75 0.62 0.57  CALCIUM 8.4* 7.9* 7.9*  MG  --  1.7 2.3   Liver Function Tests: Recent Labs  Lab 11/09/22 1205 11/10/22 0510  AST 18 19  ALT 17 14  ALKPHOS 55 48  BILITOT 0.6 0.7  PROT 7.5 6.4*  ALBUMIN 3.6 2.9*   No results for input(s): "LIPASE", "AMYLASE" in the last 168 hours. No results for input(s): "AMMONIA" in the last 168 hours. CBC: Recent Labs  Lab 11/09/22 1205 11/10/22 0510 11/11/22 0508  WBC 20.4* 22.1* 13.2*  NEUTROABS 16.7* 18.1*  --   HGB 12.9 10.8* 10.9*  HCT 38.3 32.0* 32.7*  MCV 88.7 88.6 90.1  PLT 289 263 276   Cardiac Enzymes: No results for input(s): "CKTOTAL", "CKMB", "CKMBINDEX", "TROPONINI" in the last 168 hours. BNP: Invalid input(s): "POCBNP" CBG: No results for input(s): "GLUCAP" in the last 168 hours. D-Dimer No results for input(s): "DDIMER" in the last 72 hours. Hgb A1c No results for input(s): "HGBA1C" in the last 72 hours. Lipid Profile No results for input(s): "CHOL", "HDL", "LDLCALC", "TRIG", "CHOLHDL", "LDLDIRECT" in the last 72 hours. Thyroid function studies No results for input(s): "TSH", "T4TOTAL", "T3FREE", "THYROIDAB" in the last 72 hours.  Invalid input(s): "FREET3" Anemia work up No results for input(s): "VITAMINB12", "FOLATE", "FERRITIN", "TIBC", "IRON", "RETICCTPCT" in the last 72 hours. Urinalysis    Component Value Date/Time   COLORURINE AMBER (A) 11/09/2022 1210  APPEARANCEUR CLOUDY (A) 11/09/2022 1210   LABSPEC 1.026 11/09/2022 1210   PHURINE 6.0 11/09/2022 1210   GLUCOSEU NEGATIVE 11/09/2022 1210   HGBUR SMALL (A) 11/09/2022 1210   BILIRUBINUR NEGATIVE 11/09/2022 1210   KETONESUR 20 (A) 11/09/2022 1210   PROTEINUR 100 (A) 11/09/2022 1210   UROBILINOGEN 0.2 10/09/2011 1712   NITRITE NEGATIVE 11/09/2022 1210   LEUKOCYTESUR SMALL (A) 11/09/2022 1210   Sepsis Labs Recent Labs  Lab 11/09/22 1205 11/10/22 0510 11/11/22 0508  WBC 20.4* 22.1* 13.2*   Microbiology Recent  Results (from the past 240 hour(s))  Urine Culture (for pregnant, neutropenic or urologic patients or patients with an indwelling urinary catheter)     Status: Abnormal   Collection Time: 11/09/22 12:10 PM   Specimen: Urine, Clean Catch  Result Value Ref Range Status   Specimen Description   Final    URINE, CLEAN CATCH Performed at [facility], [address], [place], [place] [address]    Special Requests   Final    NONE Performed at [facility], [address], [place], [place] [address]    Culture (A)  Final    >=100,000 COLONIES/mL LACTOBACILLUS SPECIES Standardized susceptibility testing for this organism is not available. Performed at [facility], [place] [address], [place], [place] [address]    Report Status 11/11/2022 FINAL  Final  Blood culture (routine x 2)     Status: None (Preliminary result)   Collection Time: 11/09/22  1:45 PM   Specimen: BLOOD  Result Value Ref Range Status   Specimen Description BLOOD LEFT ARM  Final   Special Requests   Final    BOTTLES DRAWN AEROBIC AND ANAEROBIC Blood Culture adequate volume   Culture   Final    NO GROWTH 2 DAYS Performed at [facility], [address], [place], [place] [address]    Report Status PENDING  Incomplete  Blood culture (routine x 2)     Status: None (Preliminary result)   Collection Time: 11/09/22  1:50 PM   Specimen: BLOOD  Result Value Ref Range Status   Specimen Description BLOOD RIGHT ARM  Final   Special Requests   Final    BOTTLES DRAWN AEROBIC AND ANAEROBIC Blood Culture adequate volume   Culture   Final    NO GROWTH 2 DAYS Performed at [facility], [address], [place], [place] [address]    Report Status PENDING  Incomplete  Aerobic/Anaerobic Culture w Gram Stain (surgical/deep wound)     Status: None (Preliminary result)   Collection Time: 11/09/22  5:52 PM   Specimen: PATH Other; Body Fluid  Result Value Ref Range Status   Specimen Description   Final    ABSCESS FLUID PERIANAL Performed  at [facility], [address] [place], [place] [address]    Special Requests   Final    NONE Performed at [facility], [address], [place], [place] [address]    Gram Stain   Final    FEW WBC PRESENT,BOTH PMN AND MONONUCLEAR FEW GRAM POSITIVE COCCI FEW GRAM POSITIVE RODS FEW GRAM NEGATIVE RODS    Culture   Final    RARE ESCHERICHIA COLI HOLDING FOR POSSIBLE ANAEROBE Performed at [facility], [place] [address], [place], [place] [address]    Report Status PENDING  Incomplete   Organism ID, Bacteria ESCHERICHIA COLI  Final      Susceptibility   Escherichia coli - MIC*    AMPICILLIN <=2 SENSITIVE Sensitive  CEFEPIME <=0.12 SENSITIVE Sensitive     CEFTAZIDIME <=1 SENSITIVE Sensitive     CEFTRIAXONE <=0.25 SENSITIVE Sensitive     CIPROFLOXACIN <=0.25 SENSITIVE Sensitive     GENTAMICIN <=1 SENSITIVE Sensitive     IMIPENEM <=0.25 SENSITIVE Sensitive     TRIMETH/SULFA <=20 SENSITIVE Sensitive     AMPICILLIN/SULBACTAM <=2 SENSITIVE Sensitive     PIP/TAZO <=4 SENSITIVE Sensitive     * RARE ESCHERICHIA COLI    Please note: You were cared for by a hospitalist during your hospital stay. Once you are discharged, your primary care physician will handle any further medical issues. Please note that NO REFILLS for any discharge medications will be authorized once you are discharged, as it is imperative that you return to your primary care physician (or establish a relationship with a primary care physician if you do not have one) for your post hospital discharge needs so that they can reassess your need for medications and monitor your lab values.    Time coordinating discharge: 40 minutes  SIGNED:   Shelly Coss, MD  [facility] 11/11/2022, 2:38 PM Pager [phone]  If 7PM-7AM, please contact night-coverage [email or web address] Password [email or web address]

## 2022-11-12 ENCOUNTER — Telehealth: Payer: Self-pay

## 2022-11-12 ENCOUNTER — Telehealth: Payer: Self-pay | Admitting: Family Medicine

## 2022-11-12 LAB — AEROBIC/ANAEROBIC CULTURE W GRAM STAIN (SURGICAL/DEEP WOUND)

## 2022-11-12 NOTE — Telephone Encounter (Signed)
Coral Spikes, DO   Recommend visit in the near future to discuss

## 2022-11-12 NOTE — Telephone Encounter (Signed)
Appt scheduled for tomorrow.  °

## 2022-11-12 NOTE — Telephone Encounter (Signed)
Patient states had to have emergency surgery and had to cancel appointment on 2/27 she was last seen 09/18/2022 for anxiety and depression she states Zoloft 50 mg not helping  has fear of dying and crying a lot. Please advise

## 2022-11-12 NOTE — Transitions of Care (Post Inpatient/ED Visit) (Signed)
   11/12/2022  Name: Rachel Sellers MRN: [id number] DOB: 1991-04-25  Today's TOC FU Call Status: Today's TOC FU Call Status:: Unsuccessul Call (1st Attempt) Unsuccessful Call (1st Attempt) Date: 11/12/22  Attempted to reach the patient regarding the most recent Inpatient/ED visit.  Follow Up Plan: Additional outreach attempts will be made to reach the patient to complete the Transitions of Care (Post Inpatient/ED visit) call.   Signature Juanda Crumble, [place] Direct Dial [phone]

## 2022-11-12 NOTE — Telephone Encounter (Signed)
Left message to return call.  

## 2022-11-13 ENCOUNTER — Ambulatory Visit (INDEPENDENT_AMBULATORY_CARE_PROVIDER_SITE_OTHER): Payer: BC Managed Care – PPO | Admitting: Family Medicine

## 2022-11-13 VITALS — BP 110/69 | HR 100 | Ht 63.0 in | Wt 190.0 lb

## 2022-11-13 DIAGNOSIS — M4628 Osteomyelitis of vertebra, sacral and sacrococcygeal region: Secondary | ICD-10-CM | POA: Diagnosis not present

## 2022-11-13 DIAGNOSIS — K611 Rectal abscess: Secondary | ICD-10-CM | POA: Diagnosis not present

## 2022-11-13 DIAGNOSIS — K603 Anal fistula: Secondary | ICD-10-CM

## 2022-11-13 DIAGNOSIS — F419 Anxiety disorder, unspecified: Secondary | ICD-10-CM | POA: Diagnosis not present

## 2022-11-13 DIAGNOSIS — F32A Depression, unspecified: Secondary | ICD-10-CM | POA: Diagnosis not present

## 2022-11-13 MED ORDER — CLONAZEPAM 0.5 MG PO TABS: 0.5000 mg | ORAL_TABLET | Freq: Two times a day (BID) | ORAL | 3 refills | Status: DC | PRN

## 2022-11-13 NOTE — Patient Instructions (Signed)
Continue Zoloft.  Clonazepam as prescribed.  Referral to [facility] placed.  Call with concerns.

## 2022-11-13 NOTE — Transitions of Care (Post Inpatient/ED Visit) (Signed)
   11/13/2022  Name: Rachel Sellers MRN: [id number] DOB: [birth year]  Today's TOC FU Call Status: Today's TOC FU Call Status:: Successful TOC FU Call Competed Unsuccessful Call (1st Attempt) Date: 11/12/22 [facility] FU Call Complete Date: 11/13/22  Transition Care Management Follow-up Telephone Call Date of Discharge: 11/13/22 Discharge Facility: [facility] ([facility] Type of Discharge: Inpatient Admission Primary Inpatient Discharge Diagnosis:: anal abscess Reason for ED Visit: Other: How have you been since you were released from the hospital?: Better Any questions or concerns?: No  Items Reviewed: Did you receive and understand the discharge instructions provided?: Yes Medications obtained and verified?: Yes (Medications Reviewed) Any new allergies since your discharge?: No Dietary orders reviewed?: NA Do you have support at home?: Yes People in Home: spouse  Home Care and Equipment/Supplies: [facility] [place] Ordered?: Yes Name of [place]:: [facility] Has Agency set up a time to come to your home?: Yes [place] Visit Date: 11/12/22 Any new equipment or medical supplies ordered?: Yes Name of Medical supply agency?: [facility] Were you able to get the equipment/medical supplies?: Yes Do you have any questions related to the use of the equipment/supplies?: No  Functional Questionnaire: Do you need assistance with bathing/showering or dressing?: No Do you need assistance with meal preparation?: No Do you need assistance with eating?: No Do you have difficulty maintaining continence: No Do you need assistance with getting out of bed/getting out of a chair/moving?: No Do you have difficulty managing or taking your medications?: No  Folllow up appointments reviewed: PCP Follow-up appointment confirmed?: Yes Date of PCP follow-up appointment?: 11/13/22 Follow-up Provider: [facility] Follow-up appointment confirmed?: Yes Date of Specialist  follow-up appointment?: 11/17/22 Follow-Up Specialty Provider:: Dr Okey Dupre Do you need transportation to your follow-up appointment?: No Do you understand care options if your condition(s) worsen?: Yes-patient verbalized understanding    [place], [place] Direct Dial [phone]

## 2022-11-14 ENCOUNTER — Other Ambulatory Visit: Payer: Self-pay | Admitting: Family Medicine

## 2022-11-14 LAB — CULTURE, BLOOD (ROUTINE X 2)
Culture: NO GROWTH
Culture: NO GROWTH
Special Requests: ADEQUATE
Special Requests: ADEQUATE

## 2022-11-14 NOTE — Progress Notes (Signed)
Subjective:  Patient ID: Rachel Sellers, female    DOB: 08-May-1991  Age: 32 y.o. MRN: [id number]  CC: Chief Complaint  Patient presents with   anxiety and depresson     Anxiety voiced due to her current health condition , states has 2 drain lines and IVPB abx  Pain left buttock area, worried and unable to sleep well or no appetite - exp dizziness , nausea and depression     HPI:  32 year old female presents for evaluation of the above.  Patient recently mated to the hospital from 2/26 to 2/28.  Patient has longstanding history of recurrent abscess and fistula.  Presented to the ER and was found to have soft tissue abscess of the medial left buttock.  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  She underwent incision and drainage.  ID was consulted.  She was placed on antibiotics and was discharged home on IV antibiotics.  He has a PICC line.  Patient is being followed by Dr. Leighton Ruff, colorectal surgeon.  Patient is very overwhelmed at this point in time.  She is very frustrated with her medical issues.  Patient states that she feels very anxious.  She states that she constantly worries and is concerned that this problem is going to take her life.  She is also had dizziness, lightheadedness, and nausea.  She has difficulty thinking about anything else.  She is currently on Zoloft and uses trazodone for sleep.   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   Hypokalemia 06/06/2022   Perirectal abscess     Social Hx   Social History   Socioeconomic History   Marital status: Single    Spouse name: Not on file   Number of children: Not on  file   Years of education: Not on file   Highest education level: Not on file  Occupational History   Not on file  Tobacco Use   Smoking status: Every Day    Packs/day: 0.50    Years: 13.00    Total pack years: 6.50    Types: Cigarettes   Smokeless tobacco: Never  Vaping Use   Vaping Use: Never used  Substance and Sexual Activity   Alcohol use: Yes    Comment: occassionally   Drug use: Yes    Types: Marijuana    Comment: occassionally   Sexual activity: Yes    Birth control/protection: None  Other Topics Concern   Not on file  Social History Narrative   Not on file   Social Determinants of Health   Financial Resource Strain: Not on file  Food Insecurity: No Food Insecurity (11/09/2022)   Hunger Vital Sign    Worried About Running Out of Food in the Last Year: Never true    Ran Out of Food in the Last Year: Never true  Transportation Needs: No Transportation Needs (11/09/2022)   PRAPARE - Hydrologist (Medical): No    Lack of Transportation (Non-Medical): No  Physical Activity: Not on file  Stress: Not on file  Social Connections: Not on file    Review of Systems Per HPI  Objective:  BP 110/69   Pulse 100  Ht '5\' 3"'$  (1.6 m)   Wt 190 lb (86.2 kg)   LMP 11/05/2022 (Exact Date)   SpO2 97%   BMI 33.66 kg/m      11/13/2022    4:11 PM 11/11/2022    2:47 PM 11/10/2022    9:03 PM  BP/Weight  Systolic BP [id number]  Diastolic BP 69 71 64  Wt. (Lbs) 190    BMI 33.66 kg/m2      Physical Exam  Lab Results  Component Value Date   WBC 13.2 (H) 11/11/2022   HGB 10.9 (L) 11/11/2022   HCT 32.7 (L) 11/11/2022   PLT 276 11/11/2022   GLUCOSE 88 11/11/2022   ALT 14 11/10/2022   AST 19 11/10/2022   NA 133 (L) 11/11/2022   K 3.4 (L) 11/11/2022   CL 102 11/11/2022   CREATININE 0.57 11/11/2022   BUN 9 11/11/2022   CO2 24 11/11/2022   HGBA1C 5.8 (H) 12/05/2015     Assessment & Plan:   Problem List Items Addressed This Visit        Digestive   Perirectal abscess   Relevant Orders   Ambulatory referral to General Surgery   Transsphincteric anal fistula - Primary    Referring to [facility] for second opinion.      Relevant Orders   Ambulatory referral to General Surgery     Musculoskeletal and Integument   Acute osteomyelitis of coccyx [facility])   Relevant Orders   Ambulatory referral to General Surgery     Other   Anxiety and depression    Exacerbated/worsening.  Continue Zoloft.  Adding Klonopin.       Meds ordered this encounter  Medications   clonazePAM (KLONOPIN) 0.5 MG tablet    Sig: Take 1 tablet (0.5 mg total) by mouth 2 (two) times daily as needed for anxiety.    Dispense:  30 tablet    Refill:  [place]

## 2022-11-14 NOTE — Assessment & Plan Note (Signed)
Referring to [facility] for second opinion.

## 2022-11-14 NOTE — Assessment & Plan Note (Addendum)
Exacerbated/worsening.  Continue Zoloft.  Adding Klonopin.

## 2022-11-17 ENCOUNTER — Encounter (HOSPITAL_COMMUNITY): Payer: Self-pay | Admitting: Surgery

## 2022-11-17 ENCOUNTER — Other Ambulatory Visit (HOSPITAL_COMMUNITY): Payer: Self-pay | Admitting: General Surgery

## 2022-11-17 DIAGNOSIS — K604 Rectal fistula: Secondary | ICD-10-CM

## 2022-11-17 DIAGNOSIS — K6131 Horseshoe abscess: Secondary | ICD-10-CM

## 2022-11-18 ENCOUNTER — Other Ambulatory Visit (HOSPITAL_COMMUNITY): Payer: Self-pay | Admitting: General Surgery

## 2022-11-18 ENCOUNTER — Other Ambulatory Visit: Payer: Self-pay | Admitting: Family Medicine

## 2022-11-18 DIAGNOSIS — K6131 Horseshoe abscess: Secondary | ICD-10-CM

## 2022-11-18 DIAGNOSIS — K604 Rectal fistula: Secondary | ICD-10-CM

## 2022-11-20 ENCOUNTER — Ambulatory Visit (HOSPITAL_BASED_OUTPATIENT_CLINIC_OR_DEPARTMENT_OTHER): Admission: RE | Admit: 2022-11-20 | Payer: BC Managed Care – PPO | Source: Home / Self Care | Admitting: General Surgery

## 2022-11-20 DIAGNOSIS — Z01818 Encounter for other preprocedural examination: Secondary | ICD-10-CM

## 2022-11-20 SURGERY — LIGATION, INTERNAL FISTULA TRACT
Anesthesia: General

## 2022-11-27 ENCOUNTER — Ambulatory Visit (HOSPITAL_COMMUNITY)
Admission: RE | Admit: 2022-11-27 | Discharge: 2022-11-27 | Disposition: A | Discharge status: Home / Self Care | Payer: BC Managed Care – PPO | Source: Ambulatory Visit | Attending: General Surgery | Admitting: General Surgery

## 2022-11-27 DIAGNOSIS — K6131 Horseshoe abscess: Secondary | ICD-10-CM | POA: Insufficient documentation

## 2022-11-27 DIAGNOSIS — K604 Rectal fistula: Secondary | ICD-10-CM | POA: Insufficient documentation

## 2022-11-27 MED ORDER — GADOBUTROL 1 MMOL/ML IV SOLN
9.0000 mL | Freq: Once | INTRAVENOUS | Status: AC | PRN
  Administered 2022-11-27: 9 mL via INTRAVENOUS

## 2022-11-30 ENCOUNTER — Encounter: Payer: Self-pay | Admitting: Internal Medicine

## 2022-11-30 ENCOUNTER — Other Ambulatory Visit: Payer: Self-pay

## 2022-11-30 ENCOUNTER — Ambulatory Visit: Payer: BC Managed Care – PPO | Admitting: Internal Medicine

## 2022-11-30 VITALS — BP 155/77 | HR 82 | Temp 98.0°F | Wt 188.0 lb

## 2022-11-30 DIAGNOSIS — K611 Rectal abscess: Secondary | ICD-10-CM

## 2022-11-30 DIAGNOSIS — M869 Osteomyelitis, unspecified: Secondary | ICD-10-CM

## 2022-11-30 MED ORDER — AMOXICILLIN-POT CLAVULANATE 875-125 MG PO TABS: 1.0000 | ORAL_TABLET | Freq: Two times a day (BID) | ORAL | 1 refills | Status: DC

## 2022-12-01 LAB — C-REACTIVE PROTEIN: CRP: 9.8 mg/L — ABNORMAL HIGH (ref <8.0)

## 2022-12-01 LAB — CBC WITH DIFFERENTIAL/PLATELET
Absolute Monocytes: 652 cells/uL (ref 200–950)
Basophils Absolute: 65 cells/uL (ref 0–200)
Basophils Relative: 0.4 %
Eosinophils Absolute: 130 cells/uL (ref 15–500)
Eosinophils Relative: 0.8 %
HCT: 38.4 % (ref 35.0–45.0)
Hemoglobin: 13.2 g/dL (ref 11.7–15.5)
Lymphs Abs: 2624 cells/uL (ref 850–3900)
MCH: 30.2 pg (ref 27.0–33.0)
MCHC: 34.4 g/dL (ref 32.0–36.0)
MCV: 87.9 fL (ref 80.0–100.0)
MPV: 10 fL (ref 7.5–12.5)
Monocytes Relative: 4 %
Neutro Abs: 12828 cells/uL — ABNORMAL HIGH (ref 1500–7800)
Neutrophils Relative %: 78.7 %
Platelets: 305 10*3/uL (ref 140–400)
RBC: 4.37 10*6/uL (ref 3.80–5.10)
RDW: 13.9 % (ref 11.0–15.0)
Total Lymphocyte: 16.1 %
WBC: 16.3 10*3/uL — ABNORMAL HIGH (ref 3.8–10.8)

## 2022-12-01 LAB — SEDIMENTATION RATE: Sed Rate: 31 mm/h — ABNORMAL HIGH (ref 0–20)

## 2022-12-08 ENCOUNTER — Encounter: Payer: Self-pay | Admitting: Gastroenterology

## 2022-12-08 ENCOUNTER — Other Ambulatory Visit: Payer: Self-pay | Admitting: Family Medicine

## 2022-12-08 ENCOUNTER — Encounter: Payer: Self-pay | Admitting: Family Medicine

## 2022-12-08 DIAGNOSIS — K603 Anal fistula: Secondary | ICD-10-CM

## 2022-12-08 DIAGNOSIS — K611 Rectal abscess: Secondary | ICD-10-CM

## 2022-12-08 DIAGNOSIS — M4628 Osteomyelitis of vertebra, sacral and sacrococcygeal region: Secondary | ICD-10-CM

## 2022-12-15 NOTE — H&P (View-Only) (Signed)
GI Office Note    Referring Provider: Coral Spikes, DO Primary Care Physician:  Coral Spikes, DO  Primary Gastroenterologist: Elon Alas. Abbey Chatters, DO   Chief Complaint   Chief Complaint  Patient presents with   New Patient (Initial Visit)    Pt referred for colonoscopy   History of Present Illness   Rachel Sellers is a 32 y.o. 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   History of posterior rectal abscess that extended into the left buttocks. Had slow healing from this and had to pack the area and treated with multiple rounds of antibiotics.   September 2023 she represented to general surgery with Dr. Constance Haw for perirectal pain and pain in her tailbone. No fevers but had chills. She underwent irrigation and debridement with placement of penrose drain and developed post op sepsis and was treated inpatient with antibiotics. Drain removed about 11 days later and noted to have an anal fissure that was asymptomatic.   November 2023 she represented with new area of swelling and pain in the left buttocks that began draining. She was given antibiotics and advised sitzs baths. She underwent exam under general anesthesia in November and underwent fistulotomy with placement of seton. She was referred to colorectal surgery given muscle involvement 2 months after seton placement with noted improvement in trace.   Initial consult with Leighton Ruff, MD at [place] 10/05/22. Lift procedure discussed with patient.   She then presented to the [facility] ED 11/09/22 for concern for recurrent abscess and left side butt pain. CT abdomen/pelvis showed 7 x 3.5 x 3.5 cm soft tissue abscess medial left buttock fold emanating outward from a metallic density loop wire with soft tissue infection, concern for erosion of the distal coccygeal segment consistent with coccygeal osteomyelitis. Urine culture and blood cultures obtained.  General surgery consulted.  Underwent incision and drainage of peritoneal abscess. Another fistula was noted proximal to the seton. These appeared to be draining into the left lateral cavity. A seton was placed in this. A second fluid collection was found around the coccyx. This was accessed via a posterior midline incision. The cavity did not communicate with the perirectal abscess. This was drained and a Penrose was placed. A left lateral perirectal abscess and a midline posterior abscess abutting the coccyx were found.ID was also consulted. Started on broad spectrum antibiotics. PICC line was placed and she was continued on oral Flagyl and IV ceftriaxone. LIFT procedure cancelled given treatment for osteomyelitis.  Seen for follow up by Dr. Leighton Ruff [id number] and her left lateral seton fell out the night prior. Thought that her abscesses have 2 different etiologies which gives her concern for Chron's disease and recommended for her to undergo colonoscopy prior to performing surgery.   MRI pelvis 11/27/22: -complex left sided transsphincteric perianal fistula at 5 oclock position extending into the gluteal crease and superiorly in the left ischioanal fossa.   Labs 11/11/22: Na 133, K 3.4, Cr 0.57 Labs 11/30/22: ESR 31, CRP 9.8, WBC 16.3, Hgb 13.2, plts 305,   Today:  Reports in 2017 was the worst. She has had multiple issues with the abscesses in her rectum. States she had kidney failure.  Currently has one tube that is still in place, one fell out. Has a seton in place since November. A different drainage tube fell out a few weeks ago. Currently still has one in place and still  has drainage that is light yellow in color.   Had her PICC line removed on March 10. No fevers since then. States she is trying to get a referral to see a Museum/gallery conservator. She reports she is traumatized about this whole process. Reports a poor experience.  Denies any brbpr. Does not have solid bowel movements. Stools  vary from mushy pieces or something looser. At times has cramps like she has to go but not able to go. Has had some mucous stools as well. Has some lower abdominal cramping but nothing is severe. Pain usually comes and goes.   Denies any family history of chron's or ulcerative colitis. Diabetes and heart disease runs in her family.  No family history of colon cancer or colon polyps.   Working on quit smoking. Has not bought any in 1 week. Does have a vape.   Is waiting on her new dentures.   Current Outpatient Medications  Medication Sig Dispense Refill   amoxicillin-clavulanate (AUGMENTIN) 875-125 MG tablet Take 1 tablet by mouth 2 (two) times daily. 60 tablet 1   clonazePAM (KLONOPIN) 0.5 MG tablet Take 1 tablet (0.5 mg total) by mouth 2 (two) times daily as needed for anxiety. 30 tablet 3   sertraline (ZOLOFT) 50 MG tablet TAKE 1 TABLET BY MOUTH ONCE DAILY. AFTER 1 WEEK INCREASE TO 2 TABLETS 90 tablet 0   traZODone (DESYREL) 50 MG tablet TAKE 1 TABLET BY MOUTH AT BEDTIME AS NEEDED FOR SLEEP 90 tablet 0   ketorolac (TORADOL) 10 MG tablet Take 1 tablet (10 mg total) by mouth every 6 (six) hours as needed (do not take other NSAIDs with this medication). (Patient not taking: Reported on 12/16/2022) 20 tablet 0   potassium chloride SA (KLOR-CON M) 20 MEQ tablet Take 1 tablet (20 mEq total) by mouth 2 (two) times daily for 3 days. 6 tablet 0   No current facility-administered medications for this visit.    Past Medical History:  Diagnosis Date   Anxiety    Bipolar 1 disorder    Depression    OCD (obsessive compulsive disorder)    Renal disorder    kidney stones    Past Surgical History:  Procedure Laterality Date   ANAL FISTULOTOMY N/A 07/31/2022   Procedure: EXAM UNDER ANESTHESIA WITH SETON PLACEMENT;  Surgeon: Virl Cagey, MD;  Location: [facility];  Service: General;  Laterality: N/A;   CERVICAL CONIZATION W/BX Left 08/23/2019   Procedure: LASER ABLATION OF CERVIX;  Surgeon: Florian Buff, MD;  Location: [facility];  Service: Gynecology;  Laterality: Left;   INCISION AND DRAINAGE ABSCESS Left 10/30/2015   Procedure: INCISION AND DRAINAGE PERI-RECTAL ABSCESS;  Surgeon: Aviva Signs, MD;  Location: [facility];  Service: General;  Laterality: Left;   INCISION AND DRAINAGE ABSCESS N/A 11/09/2022   Procedure: INCISION AND DRAINAGE PERIRECTAL ABSCESS;  Surgeon: Rusty Aus, DO;  Location: [facility];  Service: General;  Laterality: N/A;   INCISION AND DRAINAGE PERIRECTAL ABSCESS N/A 06/05/2022   Procedure: IRRIGATION AND DEBRIDEMENT PERIRECTAL ABSCESS; PENROSE DRAIN PLACED;  Surgeon: Virl Cagey, MD;  Location: [facility];  Service: General;  Laterality: N/A;   MULTIPLE TOOTH EXTRACTIONS  05/22/2022    Family History  Problem Relation Age of Onset   Depression Mother    Anxiety disorder Mother    Depression Sister    Hypertension Maternal Grandmother    Heart attack Maternal Great-grandfather     Allergies as of 12/16/2022 - Review Complete 12/16/2022  Allergen Reaction Noted   Gentamicin Other (See Comments) 06/15/2019   Vancomycin Other (See Comments) 12/08/2017    Social History   Socioeconomic History   Marital status: Single    Spouse name: Not on file   Number of children: Not on file   Years of education: Not on file   Highest education level: Not on file  Occupational History   Not on file  Tobacco Use   Smoking status: Every Day    Packs/day: 0.50    Years: 13.00    Additional pack years: 0.00    Total pack years: 6.50    Types: Cigarettes   Smokeless tobacco: Never  Vaping Use   Vaping Use: Some days  Substance and Sexual Activity   Alcohol use: Yes    Comment: occassionally   Drug use: Yes    Types: Marijuana    Comment: occassionally   Sexual activity: Yes    Birth control/protection: None  Other Topics Concern   Not on file  Social History Narrative   Not on file   Social Determinants of Health   Financial Resource Strain:  Not on file  Food Insecurity: No Food Insecurity (11/09/2022)   Hunger Vital Sign    Worried About Running Out of Food in the Last Year: Never true    Ran Out of Food in the Last Year: Never true  Transportation Needs: No Transportation Needs (11/09/2022)   PRAPARE - Hydrologist (Medical): No    Lack of Transportation (Non-Medical): No  Physical Activity: Not on file  Stress: Not on file  Social Connections: Not on file  Intimate Partner Violence: Not At Risk (11/09/2022)   Humiliation, Afraid, Rape, and Kick questionnaire    Fear of Current or Ex-Partner: No    Emotionally Abused: No    Physically Abused: No    Sexually Abused: No     Review of Systems   Gen: Denies any fever, chills, fatigue, weight loss, lack of appetite.  CV: Denies chest pain, heart palpitations, peripheral edema, syncope.  Resp: Denies shortness of breath at rest or with exertion. Denies wheezing or cough.  GI: see HPI GU : Denies urinary burning, urinary frequency, urinary hesitancy MS: Denies joint pain, muscle weakness, cramps, or limitation of movement.  Derm: Denies rash, itching, dry skin Psych: + anxiety and depression.  Denies memory loss, and confusion Heme: Denies bruising, bleeding, and enlarged lymph nodes.   Physical Exam   BP 129/85   Pulse 89   Temp 98.1 F (36.7 C)   Ht 5\' 3"  (1.6 m)   Wt 191 lb 6.4 oz (86.8 kg)   LMP 12/15/2022   BMI 33.90 kg/m   General:   Alert and oriented. Pleasant and cooperative. Well-nourished and well-developed.  Head:  Normocephalic and atraumatic. Eyes:  Without icterus, sclera clear and conjunctiva pink.  Ears:  Normal auditory acuity. Mouth:  No deformity or lesions, oral mucosa pink.  Lungs:  Clear to auscultation bilaterally. No wheezes, rales, or rhonchi. No distress.  Heart:  S1, S2 present without murmurs appreciated.  Abdomen:  +BS, soft, non-tender and non-distended. No HSM noted. No guarding or rebound. No masses  appreciated.  Rectal:  seton in place. No drainage on exam today. Perrectal area soft. No drainage from penrose in place. No erythema.  Msk:  Symmetrical without gross deformities. Normal posture. Extremities:  Without edema. Neurologic:  Alert and  oriented x4;  grossly normal neurologically. Skin:  Intact without  significant lesions or rashes. Psych:  Alert and cooperative. Normal mood and affect.   Assessment   ANJULIE PEELER is a 32 y.o. female with a history of anxiety, bipolar 1, depression, OCD, and recurrent perirectal abscess and transsphincteric fistula presenting today with need to schedule colonoscopy.  Transsphincteric fistula and recurrent perianal abscess: Remote history of perirectal abscess requiring I&D in 2017. Recurrence again in September 2023 with evidence of fistula now and recurrent infections requiring multiple I&Ds. Course complicated by osteomyelitis and need for IV antibiotics. She has had consultation with colorectal surgery (Dr. Marcello Moores at [place]) for consideration of LIFT procedure but is on hold now until colonoscopy can be performed to determine if IBD (Chron's) is potential etiology for her fistula. Recent MRI pelvis revealing complex left sided transsphincteric fistula. No evidence of infection today on exam. Seton and penrose remain in place. Will proceed with colonoscopy.   PLAN   Proceed with colonoscopy with propofol by Dr. Abbey Chatters in near future: the risks, benefits, and alternatives have been discussed with the patient in detail. The patient states understanding and desires to proceed. ASA 2 UPT Follow up TBD after procedure   Venetia Night, MSN, FNP-BC, AGACNP-BC [facility]

## 2022-12-15 NOTE — Progress Notes (Unsigned)
GI Office Note    Referring Provider: Coral Spikes, DO Primary Care Physician:  Coral Spikes, DO  Primary Gastroenterologist: Elon Alas. Abbey Chatters, DO   Chief Complaint   Chief Complaint  Patient presents with   New Patient (Initial Visit)    Pt referred for colonoscopy   History of Present Illness   Rachel Sellers is a 32 y.o. 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   History of posterior rectal abscess that extended into the left buttocks. Had slow healing from this and had to pack the area and treated with multiple rounds of antibiotics.   September 2023 she represented to general surgery with Dr. Constance Haw for perirectal pain and pain in her tailbone. No fevers but had chills. She underwent irrigation and debridement with placement of penrose drain and developed post op sepsis and was treated inpatient with antibiotics. Drain removed about 11 days later and noted to have an anal fissure that was asymptomatic.   November 2023 she represented with new area of swelling and pain in the left buttocks that began draining. She was given antibiotics and advised sitzs baths. She underwent exam under general anesthesia in November and underwent fistulotomy with placement of seton. She was referred to colorectal surgery given muscle involvement 2 months after seton placement with noted improvement in trace.   Initial consult with Leighton Ruff, MD at [place] 10/05/22. Lift procedure discussed with patient.   She then presented to the [facility] ED 11/09/22 for concern for recurrent abscess and left side butt pain. CT abdomen/pelvis showed 7 x 3.5 x 3.5 cm soft tissue abscess medial left buttock fold emanating outward from a metallic density loop wire with soft tissue infection, concern for erosion of the distal coccygeal segment consistent with coccygeal osteomyelitis. Urine culture and blood cultures obtained.  General surgery consulted.  Underwent incision and drainage of peritoneal abscess. Another fistula was noted proximal to the seton. These appeared to be draining into the left lateral cavity. A seton was placed in this. A second fluid collection was found around the coccyx. This was accessed via a posterior midline incision. The cavity did not communicate with the perirectal abscess. This was drained and a Penrose was placed. A left lateral perirectal abscess and a midline posterior abscess abutting the coccyx were found.ID was also consulted. Started on broad spectrum antibiotics. PICC line was placed and she was continued on oral Flagyl and IV ceftriaxone. LIFT procedure cancelled given treatment for osteomyelitis.  Seen for follow up by Dr. Leighton Ruff [id number] and her left lateral seton fell out the night prior. Thought that her abscesses have 2 different etiologies which gives her concern for Chron's disease and recommended for her to undergo colonoscopy prior to performing surgery.   MRI pelvis 11/27/22: -complex left sided transsphincteric perianal fistula at 5 oclock position extending into the gluteal crease and superiorly in the left ischioanal fossa.   Labs 11/11/22: Na 133, K 3.4, Cr 0.57 Labs 11/30/22: ESR 31, CRP 9.8, WBC 16.3, Hgb 13.2, plts 305,   Today:  Reports in 2017 was the worst. She has had multiple issues with the abscesses in her rectum. States she had kidney failure.  Currently has one tube that is still in place, one fell out. Has a seton in place since November. A different drainage tube fell out a few weeks ago. Currently still has one in place and still  has drainage that is light yellow in color.   Had her PICC line removed on March 10. No fevers since then. States she is trying to get a referral to see a Museum/gallery conservator. She reports she is traumatized about this whole process. Reports a poor experience.  Denies any brbpr. Does not have solid bowel movements. Stools  vary from mushy pieces or something looser. At times has cramps like she has to go but not able to go. Has had some mucous stools as well. Has some lower abdominal cramping but nothing is severe. Pain usually comes and goes.   Denies any family history of chron's or ulcerative colitis. Diabetes and heart disease runs in her family.  No family history of colon cancer or colon polyps.   Working on quit smoking. Has not bought any in 1 week. Does have a vape.   Is waiting on her new dentures.   Current Outpatient Medications  Medication Sig Dispense Refill   amoxicillin-clavulanate (AUGMENTIN) 875-125 MG tablet Take 1 tablet by mouth 2 (two) times daily. 60 tablet 1   clonazePAM (KLONOPIN) 0.5 MG tablet Take 1 tablet (0.5 mg total) by mouth 2 (two) times daily as needed for anxiety. 30 tablet 3   sertraline (ZOLOFT) 50 MG tablet TAKE 1 TABLET BY MOUTH ONCE DAILY. AFTER 1 WEEK INCREASE TO 2 TABLETS 90 tablet 0   traZODone (DESYREL) 50 MG tablet TAKE 1 TABLET BY MOUTH AT BEDTIME AS NEEDED FOR SLEEP 90 tablet 0   ketorolac (TORADOL) 10 MG tablet Take 1 tablet (10 mg total) by mouth every 6 (six) hours as needed (do not take other NSAIDs with this medication). (Patient not taking: Reported on 12/16/2022) 20 tablet 0   potassium chloride SA (KLOR-CON M) 20 MEQ tablet Take 1 tablet (20 mEq total) by mouth 2 (two) times daily for 3 days. 6 tablet 0   No current facility-administered medications for this visit.    Past Medical History:  Diagnosis Date   Anxiety    Bipolar 1 disorder    Depression    OCD (obsessive compulsive disorder)    Renal disorder    kidney stones    Past Surgical History:  Procedure Laterality Date   ANAL FISTULOTOMY N/A 07/31/2022   Procedure: EXAM UNDER ANESTHESIA WITH SETON PLACEMENT;  Surgeon: Virl Cagey, MD;  Location: [facility];  Service: General;  Laterality: N/A;   CERVICAL CONIZATION W/BX Left 08/23/2019   Procedure: LASER ABLATION OF CERVIX;  Surgeon: Florian Buff, MD;  Location: [facility];  Service: Gynecology;  Laterality: Left;   INCISION AND DRAINAGE ABSCESS Left 10/30/2015   Procedure: INCISION AND DRAINAGE PERI-RECTAL ABSCESS;  Surgeon: Aviva Signs, MD;  Location: [facility];  Service: General;  Laterality: Left;   INCISION AND DRAINAGE ABSCESS N/A 11/09/2022   Procedure: INCISION AND DRAINAGE PERIRECTAL ABSCESS;  Surgeon: Rusty Aus, DO;  Location: [facility];  Service: General;  Laterality: N/A;   INCISION AND DRAINAGE PERIRECTAL ABSCESS N/A 06/05/2022   Procedure: IRRIGATION AND DEBRIDEMENT PERIRECTAL ABSCESS; PENROSE DRAIN PLACED;  Surgeon: Virl Cagey, MD;  Location: [facility];  Service: General;  Laterality: N/A;   MULTIPLE TOOTH EXTRACTIONS  05/22/2022    Family History  Problem Relation Age of Onset   Depression Mother    Anxiety disorder Mother    Depression Sister    Hypertension Maternal Grandmother    Heart attack Maternal Great-grandfather     Allergies as of 12/16/2022 - Review Complete 12/16/2022  Allergen Reaction Noted   Gentamicin Other (See Comments) 06/15/2019   Vancomycin Other (See Comments) 12/08/2017    Social History   Socioeconomic History   Marital status: Single    Spouse name: Not on file   Number of children: Not on file   Years of education: Not on file   Highest education level: Not on file  Occupational History   Not on file  Tobacco Use   Smoking status: Every Day    Packs/day: 0.50    Years: 13.00    Additional pack years: 0.00    Total pack years: 6.50    Types: Cigarettes   Smokeless tobacco: Never  Vaping Use   Vaping Use: Some days  Substance and Sexual Activity   Alcohol use: Yes    Comment: occassionally   Drug use: Yes    Types: Marijuana    Comment: occassionally   Sexual activity: Yes    Birth control/protection: None  Other Topics Concern   Not on file  Social History Narrative   Not on file   Social Determinants of Health   Financial Resource Strain:  Not on file  Food Insecurity: No Food Insecurity (11/09/2022)   Hunger Vital Sign    Worried About Running Out of Food in the Last Year: Never true    Ran Out of Food in the Last Year: Never true  Transportation Needs: No Transportation Needs (11/09/2022)   PRAPARE - Hydrologist (Medical): No    Lack of Transportation (Non-Medical): No  Physical Activity: Not on file  Stress: Not on file  Social Connections: Not on file  Intimate Partner Violence: Not At Risk (11/09/2022)   Humiliation, Afraid, Rape, and Kick questionnaire    Fear of Current or Ex-Partner: No    Emotionally Abused: No    Physically Abused: No    Sexually Abused: No     Review of Systems   Gen: Denies any fever, chills, fatigue, weight loss, lack of appetite.  CV: Denies chest pain, heart palpitations, peripheral edema, syncope.  Resp: Denies shortness of breath at rest or with exertion. Denies wheezing or cough.  GI: see HPI GU : Denies urinary burning, urinary frequency, urinary hesitancy MS: Denies joint pain, muscle weakness, cramps, or limitation of movement.  Derm: Denies rash, itching, dry skin Psych: + anxiety and depression.  Denies memory loss, and confusion Heme: Denies bruising, bleeding, and enlarged lymph nodes.   Physical Exam   BP 129/85   Pulse 89   Temp 98.1 F (36.7 C)   Ht 5\' 3"  (1.6 m)   Wt 191 lb 6.4 oz (86.8 kg)   LMP 12/15/2022   BMI 33.90 kg/m   General:   Alert and oriented. Pleasant and cooperative. Well-nourished and well-developed.  Head:  Normocephalic and atraumatic. Eyes:  Without icterus, sclera clear and conjunctiva pink.  Ears:  Normal auditory acuity. Mouth:  No deformity or lesions, oral mucosa pink.  Lungs:  Clear to auscultation bilaterally. No wheezes, rales, or rhonchi. No distress.  Heart:  S1, S2 present without murmurs appreciated.  Abdomen:  +BS, soft, non-tender and non-distended. No HSM noted. No guarding or rebound. No masses  appreciated.  Rectal:  seton in place. No drainage on exam today. Perrectal area soft. No drainage from penrose in place. No erythema.  Msk:  Symmetrical without gross deformities. Normal posture. Extremities:  Without edema. Neurologic:  Alert and  oriented x4;  grossly normal neurologically. Skin:  Intact without  significant lesions or rashes. Psych:  Alert and cooperative. Normal mood and affect.   Assessment   Rachel Sellers is a 32 y.o. female with a history of anxiety, bipolar 1, depression, OCD, and recurrent perirectal abscess and transsphincteric fistula presenting today with need to schedule colonoscopy.  Transsphincteric fistula and recurrent perianal abscess: Remote history of perirectal abscess requiring I&D in 2017. Recurrence again in September 2023 with evidence of fistula now and recurrent infections requiring multiple I&Ds. Course complicated by osteomyelitis and need for IV antibiotics. She has had consultation with colorectal surgery (Dr. Marcello Moores at [place]) for consideration of LIFT procedure but is on hold now until colonoscopy can be performed to determine if IBD (Chron's) is potential etiology for her fistula. Recent MRI pelvis revealing complex left sided transsphincteric fistula. No evidence of infection today on exam. Seton and penrose remain in place. Will proceed with colonoscopy.   PLAN   Proceed with colonoscopy with propofol by Dr. Abbey Chatters in near future: the risks, benefits, and alternatives have been discussed with the patient in detail. The patient states understanding and desires to proceed. ASA 2 UPT Follow up TBD after procedure   Venetia Night, MSN, FNP-BC, AGACNP-BC [facility]

## 2022-12-16 ENCOUNTER — Ambulatory Visit (INDEPENDENT_AMBULATORY_CARE_PROVIDER_SITE_OTHER): Payer: BC Managed Care – PPO | Admitting: Gastroenterology

## 2022-12-16 ENCOUNTER — Encounter: Payer: Self-pay | Admitting: Gastroenterology

## 2022-12-16 ENCOUNTER — Encounter: Payer: Self-pay | Admitting: *Deleted

## 2022-12-16 ENCOUNTER — Other Ambulatory Visit: Payer: Self-pay | Admitting: *Deleted

## 2022-12-16 VITALS — BP 129/85 | HR 89 | Temp 98.1°F | Ht 63.0 in | Wt 191.4 lb

## 2022-12-16 DIAGNOSIS — K610 Anal abscess: Secondary | ICD-10-CM

## 2022-12-16 DIAGNOSIS — K603 Anal fistula: Secondary | ICD-10-CM

## 2022-12-16 NOTE — Patient Instructions (Addendum)
We are scheduling you for a colonoscopy in the near future with Dr. Abbey Chatters. You will need to do a pregnancy test prior to your procedure. They will let you know when you need to complete this.   It was a pleasure to see you today. I want to create trusting relationships with patients. If you receive a survey regarding your visit,  I greatly appreciate you taking time to fill this out on paper or through your MyChart. I value your feedback.  Venetia Night, MSN, FNP-BC, AGACNP-BC [facility]

## 2022-12-17 NOTE — Telephone Encounter (Signed)
Coral Spikes, DO     Federico Maiorino, please refer to the surgeon she requested as well.

## 2022-12-21 ENCOUNTER — Other Ambulatory Visit (HOSPITAL_COMMUNITY)
Admission: RE | Admit: 2022-12-21 | Discharge: 2022-12-21 | Disposition: A | Discharge status: Home / Self Care | Payer: BC Managed Care – PPO | Source: Ambulatory Visit | Attending: Internal Medicine | Admitting: Internal Medicine

## 2022-12-21 DIAGNOSIS — K603 Anal fistula: Secondary | ICD-10-CM | POA: Insufficient documentation

## 2022-12-21 DIAGNOSIS — F17210 Nicotine dependence, cigarettes, uncomplicated: Secondary | ICD-10-CM | POA: Diagnosis not present

## 2022-12-21 DIAGNOSIS — K529 Noninfective gastroenteritis and colitis, unspecified: Secondary | ICD-10-CM | POA: Diagnosis not present

## 2022-12-21 DIAGNOSIS — K633 Ulcer of intestine: Secondary | ICD-10-CM | POA: Diagnosis not present

## 2022-12-21 DIAGNOSIS — F319 Bipolar disorder, unspecified: Secondary | ICD-10-CM | POA: Diagnosis not present

## 2022-12-21 DIAGNOSIS — F419 Anxiety disorder, unspecified: Secondary | ICD-10-CM | POA: Diagnosis not present

## 2022-12-22 ENCOUNTER — Ambulatory Visit (HOSPITAL_COMMUNITY): Payer: BC Managed Care – PPO | Admitting: Certified Registered Nurse Anesthetist

## 2022-12-22 ENCOUNTER — Ambulatory Visit (HOSPITAL_COMMUNITY)
Admission: RE | Admit: 2022-12-22 | Discharge: 2022-12-22 | Disposition: A | Discharge status: Home / Self Care | Payer: BC Managed Care – PPO | Attending: Internal Medicine | Admitting: Internal Medicine

## 2022-12-22 ENCOUNTER — Other Ambulatory Visit: Payer: Self-pay

## 2022-12-22 ENCOUNTER — Encounter (HOSPITAL_COMMUNITY): Payer: Self-pay

## 2022-12-22 ENCOUNTER — Encounter (HOSPITAL_COMMUNITY)
Admission: RE | Disposition: A | Discharge status: Home / Self Care | Payer: Self-pay | Source: Home / Self Care | Attending: Internal Medicine

## 2022-12-22 DIAGNOSIS — Z01818 Encounter for other preprocedural examination: Secondary | ICD-10-CM

## 2022-12-22 DIAGNOSIS — K529 Noninfective gastroenteritis and colitis, unspecified: Secondary | ICD-10-CM | POA: Diagnosis not present

## 2022-12-22 DIAGNOSIS — F419 Anxiety disorder, unspecified: Secondary | ICD-10-CM | POA: Insufficient documentation

## 2022-12-22 DIAGNOSIS — K603 Anal fistula: Secondary | ICD-10-CM | POA: Diagnosis not present

## 2022-12-22 DIAGNOSIS — F319 Bipolar disorder, unspecified: Secondary | ICD-10-CM | POA: Insufficient documentation

## 2022-12-22 DIAGNOSIS — K633 Ulcer of intestine: Secondary | ICD-10-CM | POA: Insufficient documentation

## 2022-12-22 DIAGNOSIS — F17210 Nicotine dependence, cigarettes, uncomplicated: Secondary | ICD-10-CM | POA: Insufficient documentation

## 2022-12-22 HISTORY — PX: COLONOSCOPY WITH PROPOFOL: SHX5780

## 2022-12-22 HISTORY — PX: BIOPSY: SHX5522

## 2022-12-22 LAB — POCT PREGNANCY, URINE: Preg Test, Ur: NEGATIVE

## 2022-12-22 SURGERY — COLONOSCOPY WITH PROPOFOL
Anesthesia: General

## 2022-12-22 MED ORDER — GABAPENTIN 100 MG PO CAPS: 300.0000 mg | ORAL_CAPSULE | ORAL | Status: DC

## 2022-12-22 MED ORDER — LIDOCAINE HCL (CARDIAC) PF 100 MG/5ML IV SOSY
PREFILLED_SYRINGE | INTRAVENOUS | Status: DC | PRN
  Administered 2022-12-22: 50 mg via INTRAVENOUS

## 2022-12-22 MED ORDER — LACTATED RINGERS IV SOLN: INTRAVENOUS | Status: DC | PRN

## 2022-12-22 MED ORDER — PROPOFOL 500 MG/50ML IV EMUL
INTRAVENOUS | Status: DC | PRN
  Administered 2022-12-22: 150 ug/kg/min via INTRAVENOUS

## 2022-12-22 MED ORDER — BUPIVACAINE LIPOSOME 1.3 % IJ SUSP: 20.0000 mL | Freq: Once | INTRAMUSCULAR | Status: DC

## 2022-12-22 MED ORDER — PROPOFOL 10 MG/ML IV BOLUS
INTRAVENOUS | Status: DC | PRN
  Administered 2022-12-22: 50 mg via INTRAVENOUS
  Administered 2022-12-22: 100 mg via INTRAVENOUS

## 2022-12-22 MED ORDER — ACETAMINOPHEN 500 MG PO TABS: 1000.0000 mg | ORAL_TABLET | ORAL | Status: DC

## 2022-12-22 MED ORDER — CELECOXIB 100 MG PO CAPS: 200.0000 mg | ORAL_CAPSULE | ORAL | Status: DC

## 2022-12-22 NOTE — Op Note (Signed)
[facility] Patient Name: Rachel Sellers Procedure Date: 12/22/2022 2:19 PM MRN: [id number] Date of Birth: Oct 04, 1990 Attending MD: Hennie Duos. Marletta Lor , [place], [id number] CSN: [id number] Age: 32 Admit Type: Outpatient Procedure:                Colonoscopy Indications:              Fistula, rule out Crohn's disease Providers:                Hennie Duos. Marletta Lor, DO, Buel Ream. Thomasena Edis RN, RN,                            [employer], Kristine L. Jessee Avers,                            Technician Referring MD:              Medicines:                See the Anesthesia note for documentation of the                            administered medications Complications:            No immediate complications. Estimated Blood Loss:     Estimated blood loss was minimal. Procedure:                Pre-Anesthesia Assessment:                           - The anesthesia plan was to use monitored                            anesthesia care (MAC).                           After obtaining informed consent, the colonoscope                            was passed under direct vision. Throughout the                            procedure, the patient's blood pressure, pulse, and                            oxygen saturations were monitored continuously. The                            [id number] ([id number]) was introduced through the                            anus and advanced to the the terminal ileum, with                            identification of the appendiceal orifice and IC                            valve. The colonoscopy was performed  without                            difficulty. The patient tolerated the procedure                            well. The quality of the bowel preparation was                            evaluated using the BBPS [facility] Bowel Preparation                            Scale) with scores of: Right Colon = 2 (minor                            amount of residual staining, small  fragments of                            stool and/or opaque liquid, but mucosa seen well),                            Transverse Colon = 3 (entire mucosa seen well with                            no residual staining, small fragments of stool or                            opaque liquid) and Left Colon = 3 (entire mucosa                            seen well with no residual staining, small                            fragments of stool or opaque liquid). The total                            BBPS score equals 8. The quality of the bowel                            preparation was good. Scope In: 2:33:01 PM Scope Out: 2:58:52 PM Scope Withdrawal Time: 0 hours 24 minutes 1 second  Total Procedure Duration: 0 hours 25 minutes 51 seconds  Findings:      The perianal exam findings include seton in place, previous fistulotomy.      IC valve difficult to intubate (?scarred), terminal ileum evaluated up       to 15-20cm. Mild inflammation characterized by shallow ulcerations was       found in the terminal ileum. Biopsies were taken with a cold forceps for       histology.      The colon (entire examined portion) appeared normal. Segmental biopsies       were taken with a cold forceps for histology throughout the entire colon. Impression:               Burnadette Pop  in place, previous fistulotomy found on                            perianal exam.                           - Mild inflammation was found in the ileum, rule                            out Crohn's disease. Biopsied.                           - The entire examined colon is normal. Biopsied. Moderate Sedation:      Per Anesthesia Care Recommendation:           - Patient has a contact number available for                            emergencies. The signs and symptoms of potential                            delayed complications were discussed with the                            patient. Return to normal activities tomorrow.                             Written discharge instructions were provided to the                            patient.                           - Resume previous diet.                           - Continue present medications.                           - Await pathology results.                           - Repeat colonoscopy date to be determined after                            pending pathology results are reviewed for                            surveillance based on pathology results. Procedure Code(s):        --- Professional ---                           [phone], Colonoscopy, flexible; with biopsy, single                            or multiple Diagnosis Code(s):        --- Professional ---  K52.9, Noninfective gastroenteritis and colitis,                            unspecified CPT copyright 2022 American Medical Association. All rights reserved. The codes documented in this report are preliminary and upon coder review may  be revised to meet current compliance requirements. Hennie Duosharles K. Marletta Lorarver, DO Hennie Duosharles K. Marletta LorCarver, DO 12/22/2022 3:12:22 PM This report has been signed electronically. Number of Addenda: 0

## 2022-12-22 NOTE — Interval H&P Note (Signed)
History and Physical Interval Note:  12/22/2022 1:59 PM  Rachel Sellers  has presented today for surgery, with the diagnosis of Transsphincteric fistula, recurrent perianal abscess.  The various methods of treatment have been discussed with the patient and family. After consideration of risks, benefits and other options for treatment, the patient has consented to  Procedure(s) with comments: COLONOSCOPY WITH PROPOFOL (N/A) - 2:00 pm as a surgical intervention.  The patient's history has been reviewed, patient examined, no change in status, stable for surgery.  I have reviewed the patient's chart and labs.  Questions were answered to the patient's satisfaction.     [name]

## 2022-12-22 NOTE — Discharge Instructions (Addendum)
  Colonoscopy Discharge Instructions  Read the instructions outlined below and refer to this sheet in the next few weeks. These discharge instructions provide you with general information on caring for yourself after you leave the hospital. Your doctor may also give you specific instructions. While your treatment has been planned according to the most current medical practices available, unavoidable complications occasionally occur.   ACTIVITY You may resume your regular activity, but move at a slower pace for the next 24 hours.  Take frequent rest periods for the next 24 hours.  Walking will help get rid of the air and reduce the bloated feeling in your belly (abdomen).  No driving for 24 hours (because of the medicine (anesthesia) used during the test).   Do not sign any important legal documents or operate any machinery for 24 hours (because of the anesthesia used during the test).  NUTRITION Drink plenty of fluids.  You may resume your normal diet as instructed by your doctor.  Begin with a light meal and progress to your normal diet. Heavy or fried foods are harder to digest and may make you feel sick to your stomach (nauseated).  Avoid alcoholic beverages for 24 hours or as instructed.  MEDICATIONS You may resume your normal medications unless your doctor tells you otherwise.  WHAT YOU CAN EXPECT TODAY Some feelings of bloating in the abdomen.  Passage of more gas than usual.  Spotting of blood in your stool or on the toilet paper.  IF YOU HAD POLYPS REMOVED DURING THE COLONOSCOPY: No aspirin products for 7 days or as instructed.  No alcohol for 7 days or as instructed.  Eat a soft diet for the next 24 hours.  FINDING OUT THE RESULTS OF YOUR TEST Not all test results are available during your visit. If your test results are not back during the visit, make an appointment with your caregiver to find out the results. Do not assume everything is normal if you have not heard from your  caregiver or the medical facility. It is important for you to follow up on all of your test results.  SEEK IMMEDIATE MEDICAL ATTENTION IF: You have more than a spotting of blood in your stool.  Your belly is swollen (abdominal distention).  You are nauseated or vomiting.  You have a temperature over 101.  You have abdominal pain or discomfort that is severe or gets worse throughout the day.   I did not find any polyps or evidence of colon cancer throughout your entire colon.  Did appear that you have mild inflammation and a few small ulcers in the end portion of your small bowel called the terminal ileum.  This may be a sign of Crohn's disease.  I took numerous biopsies of the area.    I also took samples of your entire colon as well.  Await pathology results, my office will contact you and we will determine next steps.  I hope you have a great rest of your week!  Hennie Duos. Marletta Lor, D.O. Gastroenterology and Hepatology [facility]

## 2022-12-22 NOTE — Anesthesia Preprocedure Evaluation (Signed)
Anesthesia Evaluation  Patient identified by MRN, date of birth, ID band Patient awake    Reviewed: Allergy & Precautions, H&P , NPO status , Patient's Chart, lab work & pertinent test results, reviewed documented beta blocker date and time   Airway Mallampati: II  TM Distance: >3 FB Neck ROM: full    Dental no notable dental hx.    Pulmonary neg pulmonary ROS, Current Smoker   Pulmonary exam normal breath sounds clear to auscultation       Cardiovascular Exercise Tolerance: Good negative cardio ROS  Rhythm:regular Rate:Normal     Neuro/Psych  PSYCHIATRIC DISORDERS Anxiety Depression Bipolar Disorder   negative neurological ROS  negative psych ROS   GI/Hepatic negative GI ROS, Neg liver ROS,,,  Endo/Other  negative endocrine ROS    Renal/GU Renal diseasenegative Renal ROS  negative genitourinary   Musculoskeletal   Abdominal   Peds  Hematology negative hematology ROS (+)   Anesthesia Other Findings   Reproductive/Obstetrics negative OB ROS                             Anesthesia Physical Anesthesia Plan  ASA: 2  Anesthesia Plan: General   Post-op Pain Management:    Induction:   PONV Risk Score and Plan: Propofol infusion  Airway Management Planned:   Additional Equipment:   Intra-op Plan:   Post-operative Plan:   Informed Consent: I have reviewed the patients History and Physical, chart, labs and discussed the procedure including the risks, benefits and alternatives for the proposed anesthesia with the patient or authorized representative who has indicated his/her understanding and acceptance.     Dental Advisory Given  Plan Discussed with: CRNA  Anesthesia Plan Comments:        Anesthesia Quick Evaluation

## 2022-12-22 NOTE — Transfer of Care (Signed)
Immediate Anesthesia Transfer of Care Note  Patient: Rachel Sellers  Procedure(s) Performed: COLONOSCOPY WITH PROPOFOL BIOPSY  Patient Location: Endoscopy Unit  Anesthesia Type:General  Level of Consciousness: awake, alert , and oriented  Airway & Oxygen Therapy: Patient Spontanous Breathing  Post-op Assessment: Report given to RN and Post -op Vital signs reviewed and stable  Post vital signs: Reviewed and stable  Last Vitals:  Vitals Value Taken Time  BP 98/46 12/22/22 1502  Temp 36.7 C 12/22/22 1502  Pulse 89 12/22/22 1502  Resp 14 12/22/22 1502  SpO2 99 % 12/22/22 1502    Last Pain:  Vitals:   12/22/22 1502  TempSrc: Oral  PainSc: 0-No pain         Complications: No notable events documented.

## 2022-12-24 LAB — SURGICAL PATHOLOGY

## 2022-12-25 NOTE — Anesthesia Postprocedure Evaluation (Signed)
Anesthesia Post Note  Patient: [place] N Jhaveri  Procedure(s) Performed: COLONOSCOPY WITH PROPOFOL BIOPSY  Patient location during evaluation: Phase II Anesthesia Type: General Level of consciousness: awake Pain management: pain level controlled Vital Signs Assessment: post-procedure vital signs reviewed and stable Respiratory status: spontaneous breathing and respiratory function stable Cardiovascular status: blood pressure returned to baseline and stable Postop Assessment: no headache and no apparent nausea or vomiting Anesthetic complications: no Comments: Late entry   No notable events documented.   Last Vitals:  Vitals:   12/22/22 1306 12/22/22 1502  BP: 106/69 (!) 98/46  Pulse: 67 89  Resp: 14 14  Temp: 36.9 C 36.7 C  SpO2: 98% 99%    Last Pain:  Vitals:   12/22/22 1502  TempSrc: Oral  PainSc: 0-No pain                 Windell Norfolk

## 2022-12-29 ENCOUNTER — Encounter (HOSPITAL_COMMUNITY): Payer: Self-pay | Admitting: Internal Medicine

## 2023-01-04 ENCOUNTER — Ambulatory Visit (INDEPENDENT_AMBULATORY_CARE_PROVIDER_SITE_OTHER): Payer: BC Managed Care – PPO | Admitting: Internal Medicine

## 2023-01-04 ENCOUNTER — Encounter: Payer: Self-pay | Admitting: Internal Medicine

## 2023-01-04 ENCOUNTER — Other Ambulatory Visit: Payer: Self-pay

## 2023-01-04 VITALS — BP 114/78 | HR 71 | Resp 16 | Ht 63.0 in | Wt 189.0 lb

## 2023-01-04 DIAGNOSIS — K611 Rectal abscess: Secondary | ICD-10-CM | POA: Diagnosis not present

## 2023-01-04 NOTE — Progress Notes (Signed)
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 Since we last saw her, she had colonoscopy -- path negative for UC or crohns/ did comment that there was some inflammation about TI but no ulcers or other abnormality. She is being treated for Ecoli coccygeal osteomyelitis - currently at 8 wk mark of treatment with augmentin. See colorectal surgeon tomorrow for further management, eval for need of LIFT procedure  Patient is still concern about colonoscopy findings. Still has some mucous like stool  Lab Results  Component Value Date   ESRSEDRATE 31 (H) 11/30/2022   Lab Results  Component Value Date   CRP 9.8 (H) 11/30/2022    Outpatient Encounter Medications as of 01/04/2023  Medication Sig   amoxicillin-clavulanate (AUGMENTIN) 875-125 MG tablet Take 1 tablet by mouth 2 (two) times daily.   clonazePAM (KLONOPIN) 0.5 MG tablet Take 1 tablet (0.5 mg total) by mouth 2 (two) times daily as needed for anxiety.   sertraline (ZOLOFT) 50 MG tablet TAKE 1 TABLET BY MOUTH ONCE DAILY. AFTER 1 WEEK INCREASE TO 2 TABLETS (Patient taking differently: Take 100 mg by mouth in the morning.)   traZODone (DESYREL) 50 MG tablet TAKE 1 TABLET BY MOUTH AT BEDTIME AS NEEDED FOR SLEEP (Patient taking differently: Take 50 mg by mouth at bedtime. for sleep)   ketorolac (TORADOL) 10 MG tablet Take 1 tablet (10 mg total) by mouth every 6 (six) hours as needed (do not take other NSAIDs with this medication). (Patient not taking: Reported on 12/16/2022)   potassium chloride SA (KLOR-CON M) 20 MEQ tablet Take 1 tablet (20 mEq total) by mouth 2 (two) times daily for 3 days. (Patient not taking: Reported on 12/16/2022)   No facility-administered encounter medications on file as of  01/04/2023.     Patient Active Problem List   Diagnosis Date Noted   Perianal abscess 11/09/2022   Acute osteomyelitis of coccyx 11/09/2022   UTI (urinary tract infection) 11/09/2022   SIRS (systemic inflammatory response syndrome) 11/09/2022   Osteomyelitis 11/09/2022   Anxiety and depression 09/20/2022   Insomnia 09/20/2022   Transsphincteric anal fistula 07/23/2022   Hypokalemia 06/06/2022   Perirectal abscess      Health Maintenance Due  Topic Date Due   COVID-19 Vaccine (1) Never done   Hepatitis C Screening  Never done     Review of Systems 12 point ros negative except what is mentioned above Physical Exam   BP 114/78   Pulse 71   Resp 16   Ht  (1.6 m)   Wt 189 lb (85.7 kg)   LMP 12/04/2022 (Approximate)   SpO2 100%   BMI 33.48 kg/m    Gen = a x o by 3 in nad Heent = PERRLA EOMI, no scleral icterus, MMM Abd= BS+,   CBC Lab Results  Component Value Date   WBC 16.3 (H) 11/30/2022   RBC 4.37 11/30/2022   HGB 13.2 11/30/2022   HCT 38.4 11/30/2022   PLT 305 11/30/2022   MCV 87.9 11/30/2022   MCH 30.2 11/30/2022   MCHC 34.4 11/30/2022   RDW 13.9 11/30/2022   LYMPHSABS 2,624 11/30/2022   MONOABS 1.2 (H) 11/10/2022   EOSABS 130 11/30/2022    BMET Lab Results  Component Value Date   NA  133 (L) 11/11/2022   K 3.4 (L) 11/11/2022   CL 102 11/11/2022   CO2 24 11/11/2022   GLUCOSE 88 11/11/2022   BUN 9 11/11/2022   CREATININE 0.57 11/11/2022   CALCIUM 7.9 (L) 11/11/2022   GFRNONAA >60 11/11/2022   GFRAA >60 08/21/2019      Assessment and Plan  Reviewed findings of colonoscopy with patient.  Will check sed rate and crp, if not normalized will likely extend for addn 2-4 wk of oral abtx Abn stool caliber= some of which could be due to abtx associated loose stool but wouldn't necessarily expect mucous like stool. Will discuss with patient results and abtx plan after she meets with colorectal surgeons.  Will touch base in 72hr after results  come in.

## 2023-01-05 ENCOUNTER — Ambulatory Visit: Payer: Self-pay | Admitting: General Surgery

## 2023-01-05 LAB — SEDIMENTATION RATE: Sed Rate: 28 mm/h — ABNORMAL HIGH (ref 0–20)

## 2023-01-05 LAB — C-REACTIVE PROTEIN: CRP: 15.8 mg/L — ABNORMAL HIGH (ref <8.0)

## 2023-01-05 NOTE — H&P (Signed)
REFERRING PHYSICIAN:  Romie Levee Christin*  PROVIDER:  Elenora Gamma, MD  MRN: [id number] DOB: 05-Jul-1991 DATE OF ENCOUNTER: 01/05/2023  Subjective   Chief Complaint: No chief complaint on file.     History of Present Illness:  She was noticed to have recurrent perianal abscesses and a concern for fistula was raised.  She was taken to the operating room in mid November 2023 for exam under anesthesia at [facility].  Burnadette Pop was placed due to significant sphincter involvement.  She was seen here in the office and found to have a seton in place in the left posterior anal canal.  We decided to proceed with a lift procedure.  This was scheduled but the patient then presented to [facility] with worsening pain.  CT scan showed a 7 x 4 cm erosion around the coccyx.  General surgery was consulted and she underwent incision and drainage with placement of Penrose drain and seton.  Another fistula was noted proximal to the seton.  These appeared to be draining into the left lateral cavity.  A seton was placed in this.  A second fluid collection was found around the coccyx.  This was accessed via a posterior midline incision.  The cavity did not communicate with the perirectal abscess.  This was drained and a Penrose was placed.  A left lateral perirectal abscess and a midline posterior abscess abutting the coccyx were found.  The drain subsequently fell out but she has been healing appropriately since then.  She underwent a colonoscopy which showed some terminal ileum inflammation but otherwise normal.  Biopsies of this inflammation did not show any signs consistent with inflammatory bowel disease.  MRI also performed which shows a large branching fistula on the patient's left side.  There is no tracking to the right side or evidence of horseshoe fistula.  Pt has been doing well.  She has quit smoking completely.  Past Medical History:  Diagnosis Date   Anxiety    Bipolar 1 disorder    Depression    OCD  (obsessive compulsive disorder)    Renal disorder    kidney stones   . Past Surgical History:  Procedure Laterality Date   ANAL FISTULOTOMY N/A 07/31/2022   Procedure: EXAM UNDER ANESTHESIA WITH SETON PLACEMENT;  Surgeon: Lucretia Roers, MD;  Location: [facility] ORS;  Service: General;  Laterality: N/A;   BIOPSY  12/22/2022   Procedure: BIOPSY;  Surgeon: [name], DO;  Location: [facility];  Service: Endoscopy;;   CERVICAL CONIZATION W/BX Left 08/23/2019   Procedure: LASER ABLATION OF CERVIX;  Surgeon: Lazaro Arms, MD;  Location: [facility];  Service: Gynecology;  Laterality: Left;   COLONOSCOPY WITH PROPOFOL N/A 12/22/2022   Procedure: COLONOSCOPY WITH PROPOFOL;  Surgeon: [name], DO;  Location: [facility];  Service: Endoscopy;  Laterality: N/A;  2:00 pm   INCISION AND DRAINAGE ABSCESS Left 10/30/2015   Procedure: INCISION AND DRAINAGE PERI-RECTAL ABSCESS;  Surgeon: Franky Macho, MD;  Location: [facility];  Service: General;  Laterality: Left;   INCISION AND DRAINAGE ABSCESS N/A 11/09/2022   Procedure: INCISION AND DRAINAGE PERIRECTAL ABSCESS;  Surgeon: Lewie Chamber, DO;  Location: [facility];  Service: General;  Laterality: N/A;   INCISION AND DRAINAGE PERIRECTAL ABSCESS N/A 06/05/2022   Procedure: IRRIGATION AND DEBRIDEMENT PERIRECTAL ABSCESS; PENROSE DRAIN PLACED;  Surgeon: Lucretia Roers, MD;  Location: [facility];  Service: General;  Laterality: N/A;   MULTIPLE TOOTH EXTRACTIONS  05/22/2022   Social  History   Socioeconomic History   Marital status: Single    Spouse name: Not on file   Number of children: Not on file   Years of education: Not on file   Highest education level: Not on file  Occupational History   Not on file  Tobacco Use   Smoking status: Every Day    Packs/day: 0.50    Years: 13.00    Additional pack years: 0.00    Total pack years: 6.50    Types: Cigarettes   Smokeless tobacco: Never  Vaping Use   Vaping Use: Some days  Substance and  Sexual Activity   Alcohol use: Yes    Comment: occassionally   Drug use: Yes    Types: Marijuana    Comment: occassionally   Sexual activity: Yes    Birth control/protection: None  Other Topics Concern   Not on file  Social History Narrative   Not on file   Social Determinants of Health   Financial Resource Strain: Not on file  Food Insecurity: No Food Insecurity (11/09/2022)   Hunger Vital Sign    Worried About Running Out of Food in the Last Year: Never true    Ran Out of Food in the Last Year: Never true  Transportation Needs: No Transportation Needs (11/09/2022)   PRAPARE - Administrator, Civil Service (Medical): No    Lack of Transportation (Non-Medical): No  Physical Activity: Not on file  Stress: Not on file  Social Connections: Not on file  Intimate Partner Violence: Not At Risk (11/09/2022)   Humiliation, Afraid, Rape, and Kick questionnaire    Fear of Current or Ex-Partner: No    Emotionally Abused: No    Physically Abused: No    Sexually Abused: No   Family History  Problem Relation Age of Onset   Depression Mother    Anxiety disorder Mother    Depression Sister    Hypertension Maternal Grandmother    Heart attack Maternal Great-grandfather      Current Outpatient Medications:    amoxicillin-clavulanate (AUGMENTIN) 875-125 MG tablet, Take 1 tablet by mouth 2 (two) times daily., Disp: 60 tablet, Rfl: 1   clonazePAM (KLONOPIN) 0.5 MG tablet, Take 1 tablet (0.5 mg total) by mouth 2 (two) times daily as needed for anxiety., Disp: 30 tablet, Rfl: 3   ketorolac (TORADOL) 10 MG tablet, Take 1 tablet (10 mg total) by mouth every 6 (six) hours as needed (do not take other NSAIDs with this medication). (Patient not taking: Reported on 12/16/2022), Disp: 20 tablet, Rfl: 0   potassium chloride SA (KLOR-CON M) 20 MEQ tablet, Take 1 tablet (20 mEq total) by mouth 2 (two) times daily for 3 days. (Patient not taking: Reported on 12/16/2022), Disp: 6 tablet, Rfl: 0    sertraline (ZOLOFT) 50 MG tablet, TAKE 1 TABLET BY MOUTH ONCE DAILY. AFTER 1 WEEK INCREASE TO 2 TABLETS (Patient taking differently: Take 100 mg by mouth in the morning.), Disp: 90 tablet, Rfl: 0   traZODone (DESYREL) 50 MG tablet, TAKE 1 TABLET BY MOUTH AT BEDTIME AS NEEDED FOR SLEEP (Patient taking differently: Take 50 mg by mouth at bedtime. for sleep), Disp: 90 tablet, Rfl: 0  Allergies  Allergen Reactions   Gentamicin Other (See Comments)    Renal issues.   Vancomycin Other (See Comments)    Kidney failure   Review of Systems - Negative except as stated above    Objective:     Exam Gen: NAD CV: RRR  Lungs: CTA Abd: soft Rectal: Seton in place left posterior anal canal, fistulotomy external site healing appropriately   Labs, Imaging and Diagnostic Testing: Most recent CT scan reviewed.  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  It does not cross midline  In reviewing the operative note from her last surgery, it appears she has a branching fistula in the left lateral cavity as well.    Assessment and Plan:  There are no diagnoses linked to this encounter.    33 year old fema2le with transsphincteric anal fistula status post seton placement in November 23.  She is doing well with quitting smoking.  Unfortunately she had a recurrent episode of pain and was found to have a deep perirectal abscess.  She underwent drainage of this at [facility].  I recommended an MRI and a colonoscopy for further evaluation.  MRI shows a complex branching fistula but only 1 internal opening was noted at the 5 o'clock position.  Colonoscopy was negative for inflammatory bowel disease.  We will plan on proceeding with a lift procedure.  We will need to make sure that her draining cavities are completely open as well after the procedure to avoid failure.  Even  with this, risk of failure is approximately 20%.    Vanita Panda, MD Colon and Rectal Surgery [facility]

## 2023-01-28 ENCOUNTER — Other Ambulatory Visit: Payer: Self-pay | Admitting: Family Medicine

## 2023-02-08 ENCOUNTER — Other Ambulatory Visit: Payer: Self-pay | Admitting: Family Medicine

## 2023-02-10 ENCOUNTER — Ambulatory Visit: Payer: BC Managed Care – PPO | Admitting: Gastroenterology

## 2023-02-10 MED ORDER — TRAZODONE HCL 50 MG PO TABS: 50.0000 mg | ORAL_TABLET | Freq: Every evening | ORAL | 3 refills | Status: DC | PRN

## 2023-02-10 MED ORDER — SERTRALINE HCL 100 MG PO TABS: 100.0000 mg | ORAL_TABLET | Freq: Every day | ORAL | 3 refills | Status: DC

## 2023-02-16 ENCOUNTER — Encounter (HOSPITAL_BASED_OUTPATIENT_CLINIC_OR_DEPARTMENT_OTHER): Payer: Self-pay | Admitting: General Surgery

## 2023-02-16 NOTE — Progress Notes (Signed)
Spoke w/ via phone for pre-op interview--- [place] [facility] needs dos----  UPT             Lab results------Current EKG in Epic dated 07/30/22 COVID test -----patient states asymptomatic no test needed Arrive at -------0700 NPO after MN NO Solid Food.  Med rec completed Medications to take morning of surgery ----- Zoloft, Augmentin and Clonazepam PRN Diabetic medication ----- Patient instructed no nail polish to be worn day of surgery Patient instructed to bring photo id and insurance card day of surgery Patient aware to have Driver (ride ) / caregiver Partner Nelly Laurence   for 24 hours after surgery  Patient Special Instructions ----- Pre-Op special Instructions ----- Patient verbalized understanding of instructions that were given at this phone interview. Patient denies shortness of breath, chest pain, fever, cough at this phone interview.

## 2023-03-05 ENCOUNTER — Other Ambulatory Visit: Payer: Self-pay

## 2023-03-05 ENCOUNTER — Ambulatory Visit (HOSPITAL_BASED_OUTPATIENT_CLINIC_OR_DEPARTMENT_OTHER): Payer: BC Managed Care – PPO | Admitting: Certified Registered Nurse Anesthetist

## 2023-03-05 ENCOUNTER — Encounter (HOSPITAL_BASED_OUTPATIENT_CLINIC_OR_DEPARTMENT_OTHER): Payer: Self-pay | Admitting: General Surgery

## 2023-03-05 ENCOUNTER — Ambulatory Visit (HOSPITAL_BASED_OUTPATIENT_CLINIC_OR_DEPARTMENT_OTHER)
Admission: RE | Admit: 2023-03-05 | Discharge: 2023-03-05 | Disposition: A | Discharge status: Home / Self Care | Payer: BC Managed Care – PPO | Attending: General Surgery | Admitting: General Surgery

## 2023-03-05 ENCOUNTER — Encounter (HOSPITAL_BASED_OUTPATIENT_CLINIC_OR_DEPARTMENT_OTHER)
Admission: RE | Disposition: A | Discharge status: Home / Self Care | Payer: Self-pay | Source: Home / Self Care | Attending: General Surgery

## 2023-03-05 DIAGNOSIS — F319 Bipolar disorder, unspecified: Secondary | ICD-10-CM | POA: Insufficient documentation

## 2023-03-05 DIAGNOSIS — Z6832 Body mass index (BMI) 32.0-32.9, adult: Secondary | ICD-10-CM | POA: Diagnosis not present

## 2023-03-05 DIAGNOSIS — E669 Obesity, unspecified: Secondary | ICD-10-CM | POA: Insufficient documentation

## 2023-03-05 DIAGNOSIS — K603 Anal fistula: Secondary | ICD-10-CM | POA: Diagnosis not present

## 2023-03-05 DIAGNOSIS — F429 Obsessive-compulsive disorder, unspecified: Secondary | ICD-10-CM | POA: Diagnosis not present

## 2023-03-05 DIAGNOSIS — F419 Anxiety disorder, unspecified: Secondary | ICD-10-CM | POA: Diagnosis not present

## 2023-03-05 DIAGNOSIS — Z01818 Encounter for other preprocedural examination: Secondary | ICD-10-CM

## 2023-03-05 DIAGNOSIS — K610 Anal abscess: Secondary | ICD-10-CM | POA: Diagnosis present

## 2023-03-05 HISTORY — DX: Personal history of urinary calculi: Z87.442

## 2023-03-05 HISTORY — DX: Other specified postprocedural states: Z98.890

## 2023-03-05 HISTORY — DX: Nausea with vomiting, unspecified: R11.2

## 2023-03-05 HISTORY — PX: LIGATION OF INTERNAL FISTULA TRACT: SHX6551

## 2023-03-05 LAB — POCT PREGNANCY, URINE: Preg Test, Ur: NEGATIVE

## 2023-03-05 SURGERY — LIGATION, INTERNAL FISTULA TRACT
Anesthesia: General | Site: Rectum

## 2023-03-05 MED ORDER — FENTANYL CITRATE (PF) 100 MCG/2ML IJ SOLN
INTRAMUSCULAR | Status: DC | PRN
  Administered 2023-03-05 (×2): 25 ug via INTRAVENOUS
  Administered 2023-03-05: 100 ug via INTRAVENOUS

## 2023-03-05 MED ORDER — BUPIVACAINE-EPINEPHRINE (PF) 0.25% -1:200000 IJ SOLN
INTRAMUSCULAR | Status: DC | PRN
  Administered 2023-03-05: 50 mL

## 2023-03-05 MED ORDER — ALBUTEROL SULFATE HFA 108 (90 BASE) MCG/ACT IN AERS
INHALATION_SPRAY | RESPIRATORY_TRACT | Status: DC | PRN
  Administered 2023-03-05: 6 via RESPIRATORY_TRACT

## 2023-03-05 MED ORDER — MIDAZOLAM HCL 2 MG/2ML IJ SOLN
INTRAMUSCULAR | Status: AC
  Filled 2023-03-05: qty 2

## 2023-03-05 MED ORDER — ACETAMINOPHEN 500 MG PO TABS
ORAL_TABLET | ORAL | Status: AC
  Filled 2023-03-05: qty 2

## 2023-03-05 MED ORDER — GABAPENTIN 300 MG PO CAPS
300.0000 mg | ORAL_CAPSULE | ORAL | Status: AC
  Administered 2023-03-05: 300 mg via ORAL

## 2023-03-05 MED ORDER — ROCURONIUM BROMIDE 10 MG/ML (PF) SYRINGE
PREFILLED_SYRINGE | INTRAVENOUS | Status: DC | PRN
  Administered 2023-03-05: 40 mg via INTRAVENOUS
  Administered 2023-03-05 (×2): 10 mg via INTRAVENOUS

## 2023-03-05 MED ORDER — OXYCODONE HCL 5 MG PO TABS: 5.0000 mg | ORAL_TABLET | Freq: Four times a day (QID) | ORAL | 0 refills | Status: DC | PRN

## 2023-03-05 MED ORDER — ONDANSETRON HCL 4 MG/2ML IJ SOLN
INTRAMUSCULAR | Status: AC
  Filled 2023-03-05: qty 2

## 2023-03-05 MED ORDER — OXYCODONE HCL 5 MG/5ML PO SOLN: 5.0000 mg | Freq: Once | ORAL | Status: DC | PRN

## 2023-03-05 MED ORDER — ACETAMINOPHEN 500 MG PO TABS
1000.0000 mg | ORAL_TABLET | ORAL | Status: AC
  Administered 2023-03-05: 1000 mg via ORAL

## 2023-03-05 MED ORDER — PROPOFOL 10 MG/ML IV BOLUS
INTRAVENOUS | Status: DC | PRN
  Administered 2023-03-05: 200 mg via INTRAVENOUS

## 2023-03-05 MED ORDER — SODIUM CHLORIDE 0.9% FLUSH: 3.0000 mL | Freq: Two times a day (BID) | INTRAVENOUS | Status: DC

## 2023-03-05 MED ORDER — OXYCODONE HCL 5 MG PO TABS: 5.0000 mg | ORAL_TABLET | Freq: Once | ORAL | Status: DC | PRN

## 2023-03-05 MED ORDER — LIDOCAINE HCL (PF) 2 % IJ SOLN
INTRAMUSCULAR | Status: AC
  Filled 2023-03-05: qty 5

## 2023-03-05 MED ORDER — DEXMEDETOMIDINE HCL IN NACL 80 MCG/20ML IV SOLN
INTRAVENOUS | Status: DC | PRN
  Administered 2023-03-05: 8 ug via INTRAVENOUS

## 2023-03-05 MED ORDER — CELECOXIB 200 MG PO CAPS
200.0000 mg | ORAL_CAPSULE | ORAL | Status: AC
  Administered 2023-03-05: 200 mg via ORAL

## 2023-03-05 MED ORDER — ONDANSETRON HCL 4 MG/2ML IJ SOLN
INTRAMUSCULAR | Status: DC | PRN
  Administered 2023-03-05: 4 mg via INTRAVENOUS

## 2023-03-05 MED ORDER — SUGAMMADEX SODIUM 200 MG/2ML IV SOLN
INTRAVENOUS | Status: DC | PRN
  Administered 2023-03-05: 200 mg via INTRAVENOUS

## 2023-03-05 MED ORDER — 0.9 % SODIUM CHLORIDE (POUR BTL) OPTIME
TOPICAL | Status: DC | PRN
  Administered 2023-03-05: 500 mL

## 2023-03-05 MED ORDER — FENTANYL CITRATE (PF) 100 MCG/2ML IJ SOLN
INTRAMUSCULAR | Status: AC
  Filled 2023-03-05: qty 2

## 2023-03-05 MED ORDER — MIDAZOLAM HCL 5 MG/5ML IJ SOLN
INTRAMUSCULAR | Status: DC | PRN
  Administered 2023-03-05: 2 mg via INTRAVENOUS

## 2023-03-05 MED ORDER — BUPIVACAINE LIPOSOME 1.3 % IJ SUSP: 20.0000 mL | Freq: Once | INTRAMUSCULAR | Status: DC

## 2023-03-05 MED ORDER — ALBUTEROL SULFATE HFA 108 (90 BASE) MCG/ACT IN AERS
INHALATION_SPRAY | RESPIRATORY_TRACT | Status: AC
  Filled 2023-03-05: qty 6.7

## 2023-03-05 MED ORDER — PROPOFOL 10 MG/ML IV BOLUS
INTRAVENOUS | Status: AC
  Filled 2023-03-05: qty 20

## 2023-03-05 MED ORDER — ROCURONIUM BROMIDE 10 MG/ML (PF) SYRINGE
PREFILLED_SYRINGE | INTRAVENOUS | Status: AC
  Filled 2023-03-05: qty 10

## 2023-03-05 MED ORDER — GLYCOPYRROLATE PF 0.2 MG/ML IJ SOSY
PREFILLED_SYRINGE | INTRAMUSCULAR | Status: DC | PRN
  Administered 2023-03-05: .2 mg via INTRAVENOUS

## 2023-03-05 MED ORDER — GLYCOPYRROLATE PF 0.2 MG/ML IJ SOSY
PREFILLED_SYRINGE | INTRAMUSCULAR | Status: AC
  Filled 2023-03-05: qty 1

## 2023-03-05 MED ORDER — CELECOXIB 200 MG PO CAPS
ORAL_CAPSULE | ORAL | Status: AC
  Filled 2023-03-05: qty 1

## 2023-03-05 MED ORDER — FENTANYL CITRATE (PF) 100 MCG/2ML IJ SOLN: 25.0000 ug | INTRAMUSCULAR | Status: DC | PRN

## 2023-03-05 MED ORDER — LACTATED RINGERS IV SOLN: INTRAVENOUS | Status: DC

## 2023-03-05 MED ORDER — DEXAMETHASONE SODIUM PHOSPHATE 10 MG/ML IJ SOLN
INTRAMUSCULAR | Status: DC | PRN
  Administered 2023-03-05: 5 mg via INTRAVENOUS

## 2023-03-05 MED ORDER — HYDROGEN PEROXIDE 3 % EX SOLN
CUTANEOUS | Status: DC | PRN
  Administered 2023-03-05: 1 via TOPICAL

## 2023-03-05 MED ORDER — PROMETHAZINE HCL 25 MG/ML IJ SOLN: 6.2500 mg | INTRAMUSCULAR | Status: DC | PRN

## 2023-03-05 MED ORDER — LIDOCAINE 2% (20 MG/ML) 5 ML SYRINGE
INTRAMUSCULAR | Status: DC | PRN
  Administered 2023-03-05 (×2): 60 mg via INTRAVENOUS

## 2023-03-05 MED ORDER — GABAPENTIN 300 MG PO CAPS
ORAL_CAPSULE | ORAL | Status: AC
  Filled 2023-03-05: qty 1

## 2023-03-05 MED ORDER — DEXMEDETOMIDINE HCL IN NACL 80 MCG/20ML IV SOLN
INTRAVENOUS | Status: AC
  Filled 2023-03-05: qty 20

## 2023-03-05 MED ORDER — ACETAMINOPHEN 500 MG PO TABS: 1000.0000 mg | ORAL_TABLET | Freq: Once | ORAL | Status: DC

## 2023-03-05 MED ORDER — DEXAMETHASONE SODIUM PHOSPHATE 10 MG/ML IJ SOLN
INTRAMUSCULAR | Status: AC
  Filled 2023-03-05: qty 1

## 2023-03-05 SURGICAL SUPPLY — 60 items
APL SKNCLS STERI-STRIP NONHPOA (GAUZE/BANDAGES/DRESSINGS) ×1
BENZOIN TINCTURE PRP APPL 2/3 (GAUZE/BANDAGES/DRESSINGS) ×2 IMPLANT
BLADE EXTENDED COATED 6.5IN (ELECTRODE) IMPLANT
BLADE SURG 10 STRL SS (BLADE) IMPLANT
BLADE SURG 15 STRL LF DISP TIS (BLADE) IMPLANT
BLADE SURG 15 STRL SS (BLADE) ×1
BRIEF MESH DISP LRG (UNDERPADS AND DIAPERS) ×1 IMPLANT
COVER BACK TABLE 60X90IN (DRAPES) ×1 IMPLANT
COVER MAYO STAND STRL (DRAPES) ×1 IMPLANT
DRAPE LAPAROTOMY 100X72 PEDS (DRAPES) ×1 IMPLANT
DRAPE UTILITY XL STRL (DRAPES) ×1 IMPLANT
ELECT REM PT RETURN 9FT ADLT (ELECTROSURGICAL) ×1
ELECTRODE REM PT RTRN 9FT ADLT (ELECTROSURGICAL) ×1 IMPLANT
GAUZE 4X4 16PLY ~~LOC~~+RFID DBL (SPONGE) ×1 IMPLANT
GAUZE PAD ABD 8X10 STRL (GAUZE/BANDAGES/DRESSINGS) ×1 IMPLANT
GAUZE SPONGE 4X4 12PLY STRL (GAUZE/BANDAGES/DRESSINGS) ×1 IMPLANT
GLOVE BIO SURGEON STRL SZ 6.5 (GLOVE) ×1 IMPLANT
GLOVE BIOGEL PI IND STRL 7.0 (GLOVE) ×1 IMPLANT
GLOVE BIOGEL PI IND STRL 7.5 (GLOVE) IMPLANT
GLOVE INDICATOR 6.5 STRL GRN (GLOVE) ×1 IMPLANT
GLOVE SURG SS PI 6.5 STRL IVOR (GLOVE) IMPLANT
GLOVE SURG SS PI 7.0 STRL IVOR (GLOVE) IMPLANT
GOWN STRL REUS W/TWL LRG LVL3 (GOWN DISPOSABLE) IMPLANT
GOWN STRL REUS W/TWL XL LVL3 (GOWN DISPOSABLE) ×1 IMPLANT
HYDROGEN PEROXIDE 16OZ (MISCELLANEOUS) ×1 IMPLANT
IV CATH 14GX2 1/4 (CATHETERS) IMPLANT
IV CATH 18G SAFETY (IV SOLUTION) ×1 IMPLANT
KIT SIGMOIDOSCOPE (SET/KITS/TRAYS/PACK) IMPLANT
KIT TURNOVER CYSTO (KITS) ×1 IMPLANT
NDL HYPO 22X1.5 SAFETY MO (MISCELLANEOUS) ×1 IMPLANT
NEEDLE HYPO 22X1.5 SAFETY MO (MISCELLANEOUS) ×1 IMPLANT
NS IRRIG 500ML POUR BTL (IV SOLUTION) ×1 IMPLANT
PACK BASIN DAY SURGERY FS (CUSTOM PROCEDURE TRAY) ×1 IMPLANT
PAD ARMBOARD 7.5X6 YLW CONV (MISCELLANEOUS) IMPLANT
PENCIL SMOKE EVACUATOR (MISCELLANEOUS) ×1 IMPLANT
RETRACTOR STAY HOOK 5MM (MISCELLANEOUS) IMPLANT
RETRACTOR STERILE 25.8CMX11.3 (INSTRUMENTS) IMPLANT
SLEEVE SCD COMPRESS KNEE MED (STOCKING) ×1 IMPLANT
SPONGE SURGIFOAM ABS GEL 12-7 (HEMOSTASIS) IMPLANT
SUCTION TUBE FRAZIER 10FR DISP (SUCTIONS) IMPLANT
SUT CHROMIC 2 0 SH (SUTURE) ×2 IMPLANT
SUT CHROMIC 3 0 SH 27 (SUTURE) IMPLANT
SUT SILK 2 0 (SUTURE) ×1
SUT SILK 2 0 SH CR/8 (SUTURE) ×1 IMPLANT
SUT SILK 2-0 18XBRD TIE 12 (SUTURE) ×1 IMPLANT
SUT SILK 3 0 SH 30 (SUTURE) IMPLANT
SUT SILK 3 0 SH CR/8 (SUTURE) IMPLANT
SUT VIC AB 2-0 SH 27 (SUTURE)
SUT VIC AB 2-0 SH 27XBRD (SUTURE) IMPLANT
SUT VIC AB 3-0 SH 27 (SUTURE)
SUT VIC AB 3-0 SH 27X BRD (SUTURE) IMPLANT
SUT VIC AB 3-0 SH 8-18 (SUTURE) ×1 IMPLANT
SUT VICRYL 3 0 UR 6 27 (SUTURE) IMPLANT
SYR 10ML LL (SYRINGE) ×1 IMPLANT
SYR BULB IRRIG 60ML STRL (SYRINGE) IMPLANT
SYR CONTROL 10ML LL (SYRINGE) ×1 IMPLANT
TOWEL OR 17X24 6PK STRL BLUE (TOWEL DISPOSABLE) ×1 IMPLANT
TRAY DSU PREP LF (CUSTOM PROCEDURE TRAY) ×1 IMPLANT
TUBE CONNECTING 12X1/4 (SUCTIONS) ×1 IMPLANT
YANKAUER SUCT BULB TIP NO VENT (SUCTIONS) ×1 IMPLANT

## 2023-03-05 NOTE — Anesthesia Preprocedure Evaluation (Addendum)
Anesthesia Evaluation  Patient identified by MRN, date of birth, ID band Patient awake    Reviewed: Allergy & Precautions, NPO status , Patient's Chart, lab work & pertinent test results  History of Anesthesia Complications (+) PONV and history of anesthetic complications  Airway Mallampati: II  TM Distance: >3 FB Neck ROM: Full    Dental  (+) Edentulous Lower, Edentulous Upper   Pulmonary Current SmokerPatient did not abstain from smoking.   Pulmonary exam normal        Cardiovascular negative cardio ROS Normal cardiovascular exam     Neuro/Psych  PSYCHIATRIC DISORDERS Anxiety Depression Bipolar Disorder    OCD negative neurological ROS     GI/Hepatic negative GI ROS, Neg liver ROS,,,  Endo/Other   Obesity   Renal/GU      Musculoskeletal negative musculoskeletal ROS (+)    Abdominal   Peds  Hematology negative hematology ROS (+)   Anesthesia Other Findings   Reproductive/Obstetrics                              Anesthesia Physical Anesthesia Plan  ASA: 2  Anesthesia Plan: General   Post-op Pain Management: Tylenol PO (pre-op)*   Induction: Intravenous  PONV Risk Score and Plan: 4 or greater and Treatment may vary due to age or medical condition, Ondansetron, Dexamethasone, Midazolam and Scopolamine patch - Pre-op  Airway Management Planned: Oral ETT and LMA  Additional Equipment: None  Intra-op Plan:   Post-operative Plan: Extubation in OR  Informed Consent: I have reviewed the patients History and Physical, chart, labs and discussed the procedure including the risks, benefits and alternatives for the proposed anesthesia with the patient or authorized representative who has indicated his/her understanding and acceptance.     Dental advisory given  Plan Discussed with: CRNA and Anesthesiologist  Anesthesia Plan Comments: (LMA vs. ETT pending positioning )         Anesthesia Quick Evaluation

## 2023-03-05 NOTE — Op Note (Signed)
03/05/2023  9:00 AM  PATIENT:  Rachel Sellers  32 y.o. female  Patient Care Team: Tommie Sams, DO as PCP - General (Family Medicine)  PRE-OPERATIVE DIAGNOSIS:  COMPLEX ANAL FISTULA  POST-OPERATIVE DIAGNOSIS:  COMPLEX ANAL FISTULA  PROCEDURE:  LIGATION OF INTERNAL FISTULA TRACT   Surgeon(s): Romie Levee, MD  ASSISTANT: none   ANESTHESIA:   general  SPECIMEN:  No Specimen  DISPOSITION OF SPECIMEN:  N/A  COUNTS:  YES  PLAN OF CARE: Discharge to home after PACU  PATIENT DISPOSITION:  PACU - hemodynamically stable.  INDICATION: 32 y.o. F with complex anal fistula s/p seton placement   OR FINDINGS: Near midline internal opening at the proximal anal canal as well as internal opening distal to this, also at midline  DESCRIPTION: the patient was identified in the preoperative holding area and taken to the OR where they were laid on the operating room table.  General anesthesia was induced without difficulty. The patient was then positioned in prone jackknife position with buttocks gently taped apart.  The patient was then prepped and draped in usual sterile fashion.  SCDs were noted to be in place prior to the initiation of anesthesia. A surgical timeout was performed indicating the correct patient, procedure, positioning and need for preoperative antibiotics.  A rectal block was performed using Marcaine with epinephrine mixed with Experel.    I began with a digital rectal exam.  The anal canal was gently dilated.  No masses were noted.  I then placed a Hill-Ferguson anoscope into the anal canal and evaluated this completely.  A large internal opening could be seen superior to the seton in place but inferior to the coccyx.  I evaluated this with an S shaped fistula probe.  This appeared to directly communicate with the posterior midline wound.  The Penrose was removed.  I also removed the more proximal seton which also exited at posterior midline within the anal canal.  I began  by making an incision over the intersphincteric groove using a 15 blade scalpel.  I carried my dissection down into the intersphincteric groove separating the internal and external sphincters bluntly.  I identified the first fistula tract where the seton was placed.  Burnadette Pop was exchanged for a fistula probe.  I was able to dissect around this tract without difficulty.  Both sides of the fistula were then tied with a 2-0 silk suture.  The fistula was cut with a 15 blade scalpel.  I then reinforced this closure with several interrupted 3-0 Vicryl sutures on either side.  I then continued my dissection down into the intersphincteric groove until I encountered the more proximal fistula site.  Fistula probe was placed into this cavity and brought out through the internal opening.  I was able to dissect around this gently using a blunt right angle.  I was then able to tie a 2-0 silk suture on either side of the fistula tract.  The tract was transected and the sutures were secured with a 2-0 silk suture on the external side and 2 more 2-0 silk sutures on the internal side to close the internal sphincter defect.  Once this was confirmed this was reinforced with several 3-0 Vicryl interrupted sutures on either side.  I then evaluated the closure using hydrogen peroxide injected into the external opening.  There was no leak on the more proximal fistula tract.  There was a small leak on the distal fistula tract.  This was closed using a interrupted 3-0  Vicryl suture.  The fistula tracts and cavity were then irrigated with normal saline.  I opened up both the external openings to allow for better drainage while her internal closure sites healed.  I then reapproximated the internal and external sphincter using interrupted 3-0 Vicryl sutures.  The anoderm was closed using interrupted 2-0 chromic sutures.  Hemostasis was good.  The area around the dissection points was infiltrated with subcutaneous Marcaine with epinephrine mixed  with Exparel for postoperative pain control.  A dressing was applied.  The patient was then awakened from anesthesia and sent to the postanesthesia care unit in stable condition.  All counts were correct per operating room staff.   Vanita Panda, MD  Colorectal and General Surgery [facility]

## 2023-03-05 NOTE — Discharge Instructions (Addendum)
ANORECTAL SURGERY: POST OP INSTRUCTIONS Take your usually prescribed home medications unless otherwise directed. DIET: During the first few hours after surgery sip on some liquids until you are able to urinate.  It is normal to not urinate for several hours after this surgery.  If you feel uncomfortable, please contact the office for instructions.  After you are able to urinate,you may eat, if you feel like it.  Follow a light bland diet the first 24 hours after arrival home, such as soup, liquids, crackers, etc.  Be sure to include lots of fluids daily (6-8 glasses).  Avoid fast food or heavy meals, as your are more likely to get nauseated.  Eat a low fat diet the next few days after surgery.  Limit caffeine intake to 1-2 servings a day. PAIN CONTROL: Pain is best controlled by a usual combination of several different methods TOGETHER: Muscle relaxation: Soak in a warm bath (or Sitz bath) three times a day and after bowel movements.  Continue to do this until all pain is resolved. Over the counter pain medication Prescription pain medication Most patients will experience some swelling and discomfort in the anus/rectal area and incisions.  Heat such as warm towels, sitz baths, warm baths, etc to help relax tight/sore spots and speed recovery.  Some people prefer to use ice, especially in the first couple days after surgery, as it may decrease the pain and swelling, or alternate between ice & heat.  Experiment to what works for you.  Swelling and bruising can take several weeks to resolve.  Pain can take even longer to completely resolve. It is helpful to take an over-the-counter pain medication regularly for the first few weeks.  Choose one of the following that works best for you: Naproxen (Aleve, etc)  Two 220mg tabs twice a day Ibuprofen (Advil, etc) Three 200mg tabs four times a day (every meal & bedtime) A  prescription for pain medication (such as percocet, oxycodone, hydrocodone, etc) should be  given to you upon discharge.  Take your pain medication as prescribed.  If you are having problems/concerns with the prescription medicine (does not control pain, nausea, vomiting, rash, itching, etc), please call us [phone] to see if we need to switch you to a different pain medicine that will work better for you and/or control your side effect better. If you need a refill on your pain medication, please contact your pharmacy.  They will contact our office to request authorization. Prescriptions will not be filled after 5 pm or on week-ends. KEEP YOUR BOWELS REGULAR and AVOID CONSTIPATION The goal is one to two soft bowel movements a day.  You should at least have a bowel movement every other day. Avoid getting constipated.  Between the surgery and the pain medications, it is common to experience some constipation. This can be very painful after rectal surgery.  Increasing fluid intake and taking a fiber supplement (such as Metamucil, Citrucel, FiberCon, etc) 1-2 times a day regularly will usually help prevent this problem from occurring.  A stool softener like colace is also recommended.  This can be purchased over the counter at your pharmacy.  You can take it up to 3 times a day.  If you do not have a bowel movement after 24 hrs since your surgery, take one does of milk of magnesia.  If you still haven't had a bowel movement 8-12 hours after that dose, take another dose.  If you don't have a bowel movement 48 hrs after surgery,   purchase a Fleets enema from the drug store and administer gently per package instructions.  If you still are having trouble with your bowel movements after that, please call the office for further instructions. If you develop diarrhea or have many loose bowel movements, simplify your diet to bland foods & liquids for a few days.  Stop any stool softeners and decrease your fiber supplement.  Switching to mild anti-diarrheal medications (Kayopectate, Pepto Bismol) can help.   If this worsens or does not improve, please call us.  Wound Care Remove your bandages before your first bowel movement or 8 hours after surgery.     Remove any wound packing material at this tim,e as well.  You do not need to repack the wound unless instructed otherwise.  Wear an absorbent pad or soft cotton gauze in your underwear to catch any drainage and help keep the area clean. You should change this every 2-3 hours while awake. Keep the area clean and dry.  Bathe / shower every day, especially after bowel movements.  Keep the area clean by showering / bathing over the incision / wound.   It is okay to soak an open wound to help wash it.  Wet wipes or showers / gentle washing after bowel movements is often less traumatic than regular toilet paper. You may have some styrofoam-like soft packing in the rectum which will come out with the first bowel movement.  You will often notice bleeding with bowel movements.  This should slow down by the end of the first week of surgery Expect some drainage.  This should slow down, too, by the end of the first week of surgery.  Wear an absorbent pad or soft cotton gauze in your underwear until the drainage stops. Do Not sit on a rubber or pillow ring.  This can make you symptoms worse.  You may sit on a soft pillow if needed.  ACTIVITIES as tolerated:   You may resume regular (light) daily activities beginning the next day--such as daily self-care, walking, climbing stairs--gradually increasing activities as tolerated.  If you can walk 30 minutes without difficulty, it is safe to try more intense activity such as jogging, treadmill, bicycling, low-impact aerobics, swimming, etc. Save the most intensive and strenuous activity for last such as sit-ups, heavy lifting, contact sports, etc  Refrain from any heavy lifting or straining until you are off narcotics for pain control.   You may drive when you are no longer taking prescription pain medication, you can  comfortably sit for long periods of time, and you can safely maneuver your car and apply brakes. You may have sexual intercourse when it is comfortable.  FOLLOW UP in our office Please call [facility] at [phone] to set up an appointment to see your surgeon in the office for a follow-up appointment approximately 3-4 weeks after your surgery. Make sure that you call for this appointment the day you arrive home to insure a convenient appointment time. 10. IF YOU HAVE DISABILITY OR FAMILY LEAVE FORMS, BRING THEM TO THE OFFICE FOR PROCESSING.  DO NOT GIVE THEM TO YOUR DOCTOR.     WHEN TO CALL us [phone]: Poor pain control Reactions / problems with new medications (rash/itching, nausea, etc)  Fever over 101.5 F (38.5 C) Inability to urinate Nausea and/or vomiting Worsening swelling or bruising Continued bleeding from incision. Increased pain, redness, or drainage from the incision  The clinic staff is available to answer your questions during regular business hours (8:30am-5pm).  Please don't hesitate to call and ask to speak to one of our nurses for clinical concerns.   A surgeon from [facility] is always on call at the hospitals   If you have a medical emergency, go to the nearest emergency room or call 911.    [facility] [address], [address], [place], [place]  [address] ? MAIN: [phone] ? TOLL FREE: [phone] ? FAX [phone] [email or web address]   Post Anesthesia Home Care Instructions  Activity: Get plenty of rest for the remainder of the day. A responsible individual must stay with you for 24 hours following the procedure.  For the next 24 hours, DO NOT: -Drive a car -Advertising copywriter -Drink alcoholic beverages -Take any medication unless instructed by your physician -Make any legal decisions or sign important papers.  Meals: Start with liquid foods such as gelatin or soup. Progress to regular foods  as tolerated. Avoid greasy, spicy, heavy foods. If nausea and/or vomiting occur, drink only clear liquids until the nausea and/or vomiting subsides. Call your physician if vomiting continues.  Special Instructions/Symptoms: Your throat may feel dry or sore from the anesthesia or the breathing tube placed in your throat during surgery. If this causes discomfort, gargle with warm salt water. The discomfort should disappear within 24 hours.  If you had a scopolamine patch placed behind your ear for the management of post- operative nausea and/or vomiting:  1. The medication in the patch is effective for 72 hours, after which it should be removed.  Wrap patch in a tissue and discard in the trash. Wash hands thoroughly with soap and water. 2. You may remove the patch earlier than 72 hours if you experience unpleasant side effects which may include dry mouth, dizziness or visual disturbances. 3. Avoid touching the patch. Wash your hands with soap and water after contact with the patch.    Information for Discharge Teaching: EXPAREL (bupivacaine liposome injectable suspension)   Your surgeon or anesthesiologist gave you EXPAREL(bupivacaine) to help control your pain after surgery.  EXPAREL is a local anesthetic that provides pain relief by numbing the tissue around the surgical site. EXPAREL is designed to release pain medication over time and can control pain for up to 72 hours. Depending on how you respond to EXPAREL, you may require less pain medication during your recovery.  Possible side effects: Temporary loss of sensation or ability to move in the area where bupivacaine was injected. Nausea, vomiting, constipation Rarely, numbness and tingling in your mouth or lips, lightheadedness, or anxiety may occur. Call your doctor right away if you think you may be experiencing any of these sensations, or if you have other questions regarding possible side effects.  Follow all other discharge  instructions given to you by your surgeon or nurse. Eat a healthy diet and drink plenty of water or other fluids.  If you return to the hospital for any reason within 96 hours following the administration of EXPAREL, it is important for health care providers to know that you have received this anesthetic. A teal colored band has been placed on your arm with the date, time and amount of EXPAREL you have received in order to alert and inform your health care providers. Please leave this armband in place for the full 96 hours following administration, and then you may remove the band. (May remove band Tuesday 6/25)   No ibuprofen, Advil, Aleve, Motrin, ketorolac, meloxicam, naproxen, or other NSAIDS until after 2:45 pm today  if needed. No acetaminophen/Tylenol until after 2:45 pm today if needed.

## 2023-03-05 NOTE — H&P (Signed)
PROVIDER:  Elenora Gamma, MD   MRN: [id number] DOB: 03-Sep-1991    Subjective    Chief Complaint: No chief complaint on file.       History of Present Illness:  She was noticed to have recurrent perianal abscesses and a concern for fistula was raised.  She was taken to the operating room in mid November 2023 for exam under anesthesia at [facility].  Burnadette Pop was placed due to significant sphincter involvement.  She was seen here in the office and found to have a seton in place in the left posterior anal canal.  We decided to proceed with a lift procedure.  This was scheduled but the patient then presented to [facility] with worsening pain.  CT scan showed a 7 x 4 cm erosion around the coccyx.  General surgery was consulted and she underwent incision and drainage with placement of Penrose drain and seton.  Another fistula was noted proximal to the seton.  These appeared to be draining into the left lateral cavity.  A seton was placed in this.  A second fluid collection was found around the coccyx.  This was accessed via a posterior midline incision.  The cavity did not communicate with the perirectal abscess.  This was drained and a Penrose was placed.  A left lateral perirectal abscess and a midline posterior abscess abutting the coccyx were found.  The drain subsequently fell out but she has been healing appropriately since then.  She underwent a colonoscopy which showed some terminal ileum inflammation but otherwise normal.  Biopsies of this inflammation did not show any signs consistent with inflammatory bowel disease.  MRI also performed which shows a large branching fistula on the patient's left side.  There is no tracking to the right side or evidence of horseshoe fistula.  Pt has been doing well.  She has quit smoking completely.       Past Medical History:  Diagnosis Date   Anxiety     Bipolar 1 disorder     Depression     OCD (obsessive compulsive disorder)     Renal disorder      kidney  stones    .      Past Surgical History:  Procedure Laterality Date   ANAL FISTULOTOMY N/A 07/31/2022    Procedure: EXAM UNDER ANESTHESIA WITH SETON PLACEMENT;  Surgeon: Lucretia Roers, MD;  Location: [facility] ORS;  Service: General;  Laterality: N/A;   BIOPSY   12/22/2022    Procedure: BIOPSY;  Surgeon: [name], DO;  Location: [facility] ENDO SUITE;  Service: Endoscopy;;   CERVICAL CONIZATION W/BX Left 08/23/2019    Procedure: LASER ABLATION OF CERVIX;  Surgeon: Lazaro Arms, MD;  Location: [facility] ORS;  Service: Gynecology;  Laterality: Left;   COLONOSCOPY WITH PROPOFOL N/A 12/22/2022    Procedure: COLONOSCOPY WITH PROPOFOL;  Surgeon: [name], DO;  Location: [facility] END[facility];  Service: Endoscopy;  Laterality: N/A;  2:00 pm   INCISION AND DRAINAGE ABSCESS Left 10/30/2015    Procedure: INCISION AND DRAINAGE PERI-RECTAL ABSCESS;  Surgeon: Franky Macho, MD;  Location: [facility] ORS;  Service: General;  Laterality: Left;   INCISION AND DRAINAGE ABSCESS N/A 11/09/2022    Procedure: INCISION AND DRAINAGE PERIRECTAL ABSCESS;  Surgeon: Lewie Chamber, DO;  Location: [facility] ORS;  Service: General;  Laterality: N/A;   INCISION AND DRAINAGE PERIRECTAL ABSCESS N/A 06/05/2022    Procedure: IRRIGATION AND DEBRIDEMENT PERIRECTAL ABSCESS; PENROSE DRAIN PLACED;  Surgeon: Lucretia Roers, MD;  Location: [facility] ORS;  Service: General;  Laterality: N/A;   MULTIPLE TOOTH EXTRACTIONS   05/22/2022    Social History         Socioeconomic History   Marital status: Single      Spouse name: Not on file   Number of children: Not on file   Years of education: Not on file   Highest education level: Not on file  Occupational History   Not on file  Tobacco Use   Smoking status: Every Day      Packs/day: 0.50      Years: 13.00      Additional pack years: 0.00      Total pack years: 6.50      Types: Cigarettes   Smokeless tobacco: Never  Vaping Use   Vaping Use: Some days  Substance and Sexual Activity    Alcohol use: Yes      Comment: occassionally   Drug use: Yes      Types: Marijuana      Comment: occassionally   Sexual activity: Yes      Birth control/protection: None  Other Topics Concern   Not on file  Social History Narrative   Not on file    Social Determinants of Health        Financial Resource Strain: Not on file  Food Insecurity: No Food Insecurity (11/09/2022)    Hunger Vital Sign     Worried About Running Out of Food in the Last Year: Never true     Ran Out of Food in the Last Year: Never true  Transportation Needs: No Transportation Needs (11/09/2022)    PRAPARE - Therapist, art (Medical): No     Lack of Transportation (Non-Medical): No  Physical Activity: Not on file  Stress: Not on file  Social Connections: Not on file  Intimate Partner Violence: Not At Risk (11/09/2022)    Humiliation, Afraid, Rape, and Kick questionnaire     Fear of Current or Ex-Partner: No     Emotionally Abused: No     Physically Abused: No     Sexually Abused: No         Family History  Problem Relation Age of Onset   Depression Mother     Anxiety disorder Mother     Depression Sister     Hypertension Maternal Grandmother     Heart attack Maternal Great-grandfather          Current Outpatient Medications:    amoxicillin-clavulanate (AUGMENTIN) 875-125 MG tablet, Take 1 tablet by mouth 2 (two) times daily., Disp: 60 tablet, Rfl: 1   clonazePAM (KLONOPIN) 0.5 MG tablet, Take 1 tablet (0.5 mg total) by mouth 2 (two) times daily as needed for anxiety., Disp: 30 tablet, Rfl: 3   ketorolac (TORADOL) 10 MG tablet, Take 1 tablet (10 mg total) by mouth every 6 (six) hours as needed (do not take other NSAIDs with this medication). (Patient not taking: Reported on 12/16/2022), Disp: 20 tablet, Rfl: 0   potassium chloride SA (KLOR-CON M) 20 MEQ tablet, Take 1 tablet (20 mEq total) by mouth 2 (two) times daily for 3 days. (Patient not taking: Reported on 12/16/2022), Disp:  6 tablet, Rfl: 0   sertraline (ZOLOFT) 50 MG tablet, TAKE 1 TABLET BY MOUTH ONCE DAILY. AFTER 1 WEEK INCREASE TO 2 TABLETS (Patient taking differently: Take 100 mg by mouth in the morning.), Disp: 90 tablet, Rfl: 0   traZODone (DESYREL) 50 MG tablet, TAKE  1 TABLET BY MOUTH AT BEDTIME AS NEEDED FOR SLEEP (Patient taking differently: Take 50 mg by mouth at bedtime. for sleep), Disp: 90 tablet, Rfl: 0        Allergies  Allergen Reactions   Gentamicin Other (See Comments)      Renal issues.   Vancomycin Other (See Comments)      Kidney failure    Review of Systems - Negative except as stated above       Objective:      Exam Gen: NAD CV: RRR Lungs: CTA Abd: soft Rectal: Seton in place left posterior anal canal, fistulotomy external site healing appropriately     Labs, Imaging and Diagnostic Testing: Most recent CT scan reviewed.  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  It does not cross midline   In reviewing the operative note from her last surgery, it appears she has a branching fistula in the left lateral cavity as well.     Assessment and Plan:  There are no diagnoses linked to this encounter.     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  She is doing well with quitting smoking.  Unfortunately she had a recurrent episode of pain and was found to have a deep perirectal abscess.  She underwent drainage of this at [facility].  I recommended an MRI and a colonoscopy for further evaluation.  MRI shows a complex branching fistula but only 1 internal opening was noted at the 5 o'clock position.  Colonoscopy was negative for inflammatory bowel disease.  We will plan on proceeding with a lift procedure.  We will need to make sure that her draining cavities are completely open as well  after the procedure to avoid failure.  Even with this, risk of failure is approximately 20%.     Vanita Panda, MD Colon and Rectal Surgery [facility]

## 2023-03-05 NOTE — Anesthesia Procedure Notes (Signed)
Procedure Name: Intubation Date/Time: 03/05/2023 9:13 AM  Performed by: Bishop Limbo, CRNAPre-anesthesia Checklist: Patient identified, Emergency Drugs available, Suction available and Patient being monitored Patient Re-evaluated:Patient Re-evaluated prior to induction Oxygen Delivery Method: Circle System Utilized Preoxygenation: Pre-oxygenation with 100% oxygen Induction Type: IV induction Ventilation: Mask ventilation without difficulty Laryngoscope Size: Mac and 3 Grade View: Grade I Tube type: Oral Tube size: 7.0 mm Number of attempts: 1 Airway Equipment and Method: Stylet and Bite block Placement Confirmation: ETT inserted through vocal cords under direct vision, positive ETCO2 and breath sounds checked- equal and bilateral Secured at: 22 cm Tube secured with: Tape Dental Injury: Teeth and Oropharynx as per pre-operative assessment

## 2023-03-05 NOTE — Anesthesia Postprocedure Evaluation (Signed)
Anesthesia Post Note  Patient: Rachel Sellers  Procedure(s) Performed: LIGATION OF INTERNAL FISTULA TRACT (Rectum)     Patient location during evaluation: PACU Anesthesia Type: General Level of consciousness: awake and alert Pain management: pain level controlled Vital Signs Assessment: post-procedure vital signs reviewed and stable Respiratory status: spontaneous breathing, nonlabored ventilation and respiratory function stable Cardiovascular status: stable and blood pressure returned to baseline Anesthetic complications: no   No notable events documented.  Last Vitals:  Vitals:   03/05/23 1100 03/05/23 1215  BP: 124/70 111/73  Pulse: 90 80  Resp: 19 16  Temp:  36.7 C  SpO2: 97% 98%    Last Pain:  Vitals:   03/05/23 1215  TempSrc:   PainSc: 0-No pain                 Beryle Lathe

## 2023-03-05 NOTE — Transfer of Care (Signed)
Immediate Anesthesia Transfer of Care Note  Patient: Rachel Sellers  Procedure(s) Performed: LIGATION OF INTERNAL FISTULA TRACT (Rectum)  Patient Location: PACU  Anesthesia Type:General  Level of Consciousness: awake, alert , oriented, and patient cooperative  Airway & Oxygen Therapy: Patient Spontanous Breathing  Post-op Assessment: Report given to RN and Post -op Vital signs reviewed and stable  Post vital signs: Reviewed and stable  Last Vitals:  Vitals Value Taken Time  BP 128/69 03/05/23 1038  Temp 36.4 C 03/05/23 1038  Pulse 91 03/05/23 1045  Resp 15 03/05/23 1045  SpO2 98 % 03/05/23 1045  Vitals shown include unvalidated device data.  Last Pain:  Vitals:   03/05/23 1038  TempSrc:   PainSc: 0-No pain      Patients Stated Pain Goal: 5 (03/05/23 0851)  Complications: No notable events documented.

## 2023-03-08 ENCOUNTER — Encounter (HOSPITAL_BASED_OUTPATIENT_CLINIC_OR_DEPARTMENT_OTHER): Payer: Self-pay | Admitting: General Surgery

## 2023-03-31 ENCOUNTER — Other Ambulatory Visit: Payer: Self-pay | Admitting: Family Medicine

## 2023-04-01 ENCOUNTER — Encounter: Payer: Self-pay | Admitting: Family Medicine

## 2023-04-02 NOTE — Telephone Encounter (Signed)
Tried reaching out to patient regarding sertraline prescription mailbox is full, mychart message sent.

## 2023-05-05 NOTE — Progress Notes (Signed)
Patient ID: Rachel Sellers, female   DOB: Aug 07, 1991, 32 y.o.   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 She was hospitalized for perirectal abscess with early coccygeal OM, Ct findings showed 7 x 3.5 x 3.5cm abscess medial left buttock with distal coccygeal segment c/w osteomyelitis. She underwent I x D of abscess 11/09/22. We had recommended 2 wk course of ceftriaxone plus oral flagyl, followed by transition to augmentin. She is still having some mucous in stool.   Micro: Ecoli coccygeal osteomyelitis -   Outpatient Encounter Medications as of 11/30/2022  Medication Sig   clonazePAM (KLONOPIN) 0.5 MG tablet Take 1 tablet (0.5 mg total) by mouth 2 (two) times daily as needed for anxiety.   [DISCONTINUED] amoxicillin-clavulanate (AUGMENTIN) 875-125 MG tablet Take 1 tablet by mouth 2 (two) times daily.   [DISCONTINUED] ketorolac (TORADOL) 10 MG tablet Take 1 tablet (10 mg total) by mouth every 6 (six) hours as needed (do not take other NSAIDs with this medication). (Patient not taking: Reported on 12/16/2022)   [DISCONTINUED] sertraline (ZOLOFT) 50 MG tablet TAKE 1 TABLET BY MOUTH ONCE DAILY. AFTER 1 WEEK INCREASE TO 2 TABLETS (Patient taking differently: Take 100 mg by mouth in the morning.)   [DISCONTINUED] traZODone (DESYREL) 50 MG tablet TAKE 1 TABLET BY MOUTH AT BEDTIME AS NEEDED FOR SLEEP (Patient taking differently: Take 50 mg by mouth at bedtime. for sleep)   potassium chloride SA (KLOR-CON M) 20 MEQ tablet Take 1 tablet (20 mEq total) by mouth 2 (two) times daily for 3 days. (Patient not taking: Reported on 12/16/2022)   [DISCONTINUED] cyclobenzaprine (FLEXERIL) 5 MG tablet Take 1 tablet (5 mg total) by mouth 2 (two) times daily as needed for muscle spasms. (Patient not taking: Reported on 11/30/2022)   [DISCONTINUED] lidocaine (HM LIDOCAINE PATCH) 4 % Place 1  patch onto the skin daily. (Patient not taking: Reported on 11/13/2022)   [DISCONTINUED] oxyCODONE (OXY IR/ROXICODONE) 5 MG immediate release tablet Take 1 tablet (5 mg total) by mouth every 4 (four) hours as needed for moderate pain. (Patient not taking: Reported on 11/30/2022)   [DISCONTINUED] polyethylene glycol (MIRALAX / GLYCOLAX) 17 g packet Take 17 g by mouth daily. (Patient not taking: Reported on 11/30/2022)   [DISCONTINUED] senna-docusate (SENOKOT-S) 8.6-50 MG tablet Take 1 tablet by mouth 2 (two) times daily. (Patient not taking: Reported on 11/13/2022)   No facility-administered encounter medications on file as of 11/30/2022.     Patient Active Problem List   Diagnosis Date Noted   Perianal abscess 11/09/2022   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HCC) 11/09/2022   UTI (urinary tract infection) 11/09/2022   SIRS (systemic inflammatory response syndrome) (HCC) 11/09/2022   Osteomyelitis (HCC) 11/09/2022   Anxiety and depression 09/20/2022   Insomnia 09/20/2022   Transsphincteric anal fistula 07/23/2022   Hypokalemia 06/06/2022   Perirectal abscess      Health Maintenance Due  Topic Date Due   Hepatitis C Screening  Never done   COVID-19 Vaccine (1 - 2023-24 season) Never done   INFLUENZA VACCINE  04/15/2023     Review of Systems  Physical Exam   BP (!) 155/77   Pulse 82   Temp 98 F (36.7 C) (Oral)   Wt 188 lb (85.3 kg)   LMP 11/05/2022 (Exact Date)   SpO2 98%   BMI 33.30 kg/m   Gen = a x o by  3 in nad   CBC Lab Results  Component Value Date   WBC 16.3 (H) 11/30/2022   RBC 4.37 11/30/2022   HGB 13.2 11/30/2022   HCT 38.4 11/30/2022   PLT 305 11/30/2022   MCV 87.9 11/30/2022   MCH 30.2 11/30/2022   MCHC 34.4 11/30/2022   RDW 13.9 11/30/2022   LYMPHSABS 2,624 11/30/2022   MONOABS 1.2 (H) 11/10/2022   EOSABS 130 11/30/2022    BMET Lab Results  Component Value Date   NA 133 (L) 11/11/2022   K 3.4 (L) 11/11/2022   CL 102 11/11/2022   CO2 24 11/11/2022    GLUCOSE 88 11/11/2022   BUN 9 11/11/2022   CREATININE 0.57 11/11/2022   CALCIUM 7.9 (L) 11/11/2022   GFRNONAA >60 11/11/2022   GFRAA >60 08/21/2019      Assessment and Plan  Coccygeal osteomyelitis = plan to continue with augmentin 875mg  BID for 6 additional weeks to treat 8 week course. Will also check inflammatory markers  Leukocytosis = will check cbc to see that leukocytosis is resolved

## 2023-07-13 ENCOUNTER — Other Ambulatory Visit: Payer: Self-pay | Admitting: Family Medicine

## 2023-09-07 ENCOUNTER — Emergency Department (HOSPITAL_COMMUNITY)
Admission: EM | Admit: 2023-09-07 | Discharge: 2023-09-07 | Disposition: A | Discharge status: Home / Self Care | Payer: BC Managed Care – PPO | Attending: Emergency Medicine | Admitting: Emergency Medicine

## 2023-09-07 ENCOUNTER — Encounter (HOSPITAL_COMMUNITY): Payer: Self-pay | Admitting: Emergency Medicine

## 2023-09-07 ENCOUNTER — Other Ambulatory Visit: Payer: Self-pay

## 2023-09-07 DIAGNOSIS — Z3A00 Weeks of gestation of pregnancy not specified: Secondary | ICD-10-CM | POA: Diagnosis not present

## 2023-09-07 DIAGNOSIS — O219 Vomiting of pregnancy, unspecified: Secondary | ICD-10-CM | POA: Diagnosis present

## 2023-09-07 DIAGNOSIS — O210 Mild hyperemesis gravidarum: Secondary | ICD-10-CM

## 2023-09-07 DIAGNOSIS — O211 Hyperemesis gravidarum with metabolic disturbance: Secondary | ICD-10-CM | POA: Diagnosis not present

## 2023-09-07 LAB — CBC
HCT: 39.4 % (ref 36.0–46.0)
Hemoglobin: 13.6 g/dL (ref 12.0–15.0)
MCH: 30.4 pg (ref 26.0–34.0)
MCHC: 34.5 g/dL (ref 30.0–36.0)
MCV: 87.9 fL (ref 80.0–100.0)
Platelets: 309 10*3/uL (ref 150–400)
RBC: 4.48 MIL/uL (ref 3.87–5.11)
RDW: 13.5 % (ref 11.5–15.5)
WBC: 27 10*3/uL — ABNORMAL HIGH (ref 4.0–10.5)
nRBC: 0 % (ref 0.0–0.2)

## 2023-09-07 LAB — URINALYSIS, ROUTINE W REFLEX MICROSCOPIC
Bacteria, UA: NONE SEEN
Bilirubin Urine: NEGATIVE
Glucose, UA: NEGATIVE mg/dL
Ketones, ur: 80 mg/dL — AB
Leukocytes,Ua: NEGATIVE
Nitrite: NEGATIVE
Protein, ur: 30 mg/dL — AB
Specific Gravity, Urine: 1.016 (ref 1.005–1.030)
pH: 6 (ref 5.0–8.0)

## 2023-09-07 LAB — COMPREHENSIVE METABOLIC PANEL
ALT: 18 U/L (ref 0–44)
AST: 21 U/L (ref 15–41)
Albumin: 4.3 g/dL (ref 3.5–5.0)
Alkaline Phosphatase: 50 U/L (ref 38–126)
Anion gap: 11 (ref 5–15)
BUN: 11 mg/dL (ref 6–20)
CO2: 16 mmol/L — ABNORMAL LOW (ref 22–32)
Calcium: 9.2 mg/dL (ref 8.9–10.3)
Chloride: 108 mmol/L (ref 98–111)
Creatinine, Ser: 0.67 mg/dL (ref 0.44–1.00)
GFR, Estimated: >60 mL/min (ref >60)
Glucose, Bld: 157 mg/dL — ABNORMAL HIGH (ref 70–99)
Potassium: 3.1 mmol/L — ABNORMAL LOW (ref 3.5–5.1)
Sodium: 135 mmol/L (ref 135–145)
Total Bilirubin: 0.6 mg/dL (ref <1.2)
Total Protein: 7.7 g/dL (ref 6.5–8.1)

## 2023-09-07 LAB — RESP PANEL BY RT-PCR (RSV, FLU A&B, COVID)  RVPGX2
Influenza A by PCR: NEGATIVE
Influenza B by PCR: NEGATIVE
Resp Syncytial Virus by PCR: NEGATIVE
SARS Coronavirus 2 by RT PCR: NEGATIVE

## 2023-09-07 LAB — LIPASE, BLOOD: Lipase: 23 U/L (ref 11–51)

## 2023-09-07 LAB — HCG, QUANTITATIVE, PREGNANCY: hCG, Beta Chain, Quant, S: 77547 m[IU]/mL — ABNORMAL HIGH (ref <5)

## 2023-09-07 MED ORDER — SODIUM CHLORIDE 0.9 % IV SOLN
25.0000 mg | Freq: Once | INTRAVENOUS | Status: AC
  Administered 2023-09-07: 25 mg via INTRAVENOUS
  Filled 2023-09-07: qty 1

## 2023-09-07 MED ORDER — ONDANSETRON HCL 4 MG/2ML IJ SOLN: 4.0000 mg | Freq: Once | INTRAMUSCULAR | Status: DC

## 2023-09-07 MED ORDER — PROMETHAZINE HCL 25 MG RE SUPP: 25.0000 mg | Freq: Four times a day (QID) | RECTAL | 0 refills | Status: DC | PRN

## 2023-09-07 MED ORDER — ONDANSETRON HCL 4 MG/2ML IJ SOLN
4.0000 mg | Freq: Once | INTRAMUSCULAR | Status: AC | PRN
  Administered 2023-09-07: 4 mg via INTRAVENOUS
  Filled 2023-09-07: qty 2

## 2023-09-07 MED ORDER — METOCLOPRAMIDE HCL 5 MG/ML IJ SOLN
10.0000 mg | Freq: Once | INTRAMUSCULAR | Status: AC
  Administered 2023-09-07: 10 mg via INTRAVENOUS
  Filled 2023-09-07: qty 2

## 2023-09-07 MED ORDER — PANTOPRAZOLE SODIUM 40 MG IV SOLR
40.0000 mg | Freq: Once | INTRAVENOUS | Status: AC
  Administered 2023-09-07: 40 mg via INTRAVENOUS
  Filled 2023-09-07: qty 10

## 2023-09-07 MED ORDER — SODIUM CHLORIDE 0.9 % IV BOLUS
2000.0000 mL | Freq: Once | INTRAVENOUS | Status: AC
  Administered 2023-09-07: 2000 mL via INTRAVENOUS

## 2023-09-07 NOTE — ED Notes (Signed)
Pt noted to be up walking around her room and straightening up the covers on her bed.  NAD noted.    Pt provided a urine cup and aware we need a sample.

## 2023-09-07 NOTE — ED Notes (Signed)
Pt continues to shove her fingers down her throat to force herself to throw up and dry heaving.

## 2023-09-07 NOTE — ED Notes (Signed)
Pt found vomiting in the floor.  Significant amount of clear liquid noted in the floor.  Pt reports she drank water because her mouth was dry.  Pt asked to not drink anything further w/o permission from EDP.

## 2023-09-07 NOTE — ED Triage Notes (Signed)
Patient here with almost one week of emesis, nausea in the morning as the day progresses on it becomes better. Patient actively vomiting in triage. Experiencing cold chills, fevers intermittently. Can't completely rule out pregnancy per the support person. Has been trying pepto bismol at home with no relief.

## 2023-09-07 NOTE — ED Notes (Addendum)
Pt noted to be dry heaving, but no emesis noted in trash can.  EDP made aware.

## 2023-09-07 NOTE — Discharge Instructions (Signed)
Take liquids only today.  Follow-up with your GYN doctor next week and return sooner if any problems

## 2023-09-07 NOTE — ED Provider Notes (Signed)
[place] EMERGENCY DEPARTMENT AT [facility] Provider Note   CSN: [id number] Arrival date & time: 09/07/23  1914     History  Chief Complaint  Patient presents with   Nausea   Emesis    Rachel Sellers is a 32 y.o. female.  Patient has a history of obesity and has been vomiting for the last day.  The history is provided by the patient and medical records. No language interpreter was used.  Emesis Severity:  Moderate Timing:  Constant Quality:  Bilious material Able to tolerate:  Liquids Progression:  Worsening Chronicity:  New Recent urination:  Normal Relieved by:  Nothing Worsened by:  Nothing Ineffective treatments:  None tried Associated symptoms: no abdominal pain, no cough, no diarrhea and no headaches        Home Medications Prior to Admission medications   Medication Sig Start Date End Date Taking? Authorizing Provider  promethazine (PHENERGAN) 25 MG suppository Place 1 suppository (25 mg total) rectally every 6 (six) hours as needed for nausea or vomiting. 09/07/23  Yes Bethann Berkshire, MD  clonazePAM Scarlette Calico) 0.5 MG tablet Take 1 tablet by mouth twice daily as needed for anxiety 07/13/23   Everlene Other G, DO  sertraline (ZOLOFT) 100 MG tablet Take 1 tablet (100 mg total) by mouth daily. 02/10/23   Tommie Sams, DO  sertraline (ZOLOFT) 50 MG tablet Take 2 tablets (100 mg total) by mouth daily. 04/05/23   Tommie Sams, DO  traZODone (DESYREL) 50 MG tablet Take 1 tablet (50 mg total) by mouth at bedtime as needed. for sleep 02/10/23   Tommie Sams, DO      Allergies    Gentamicin and Vancomycin    Review of Systems   Review of Systems  Constitutional:  Negative for appetite change and fatigue.  HENT:  Negative for congestion, ear discharge and sinus pressure.   Eyes:  Negative for discharge.  Respiratory:  Negative for cough.   Cardiovascular:  Negative for chest pain.  Gastrointestinal:  Positive for vomiting. Negative for abdominal pain  and diarrhea.  Genitourinary:  Negative for frequency and hematuria.  Musculoskeletal:  Negative for back pain.  Skin:  Negative for rash.  Neurological:  Negative for seizures and headaches.  Psychiatric/Behavioral:  Negative for hallucinations.     Physical Exam Updated Vital Signs BP 114/76   Pulse 85   Temp 97.7 F (36.5 C) (Oral)   Resp 18   Ht 5\' 3"  (1.6 m)   Wt 82 kg   SpO2 97%   BMI 32.02 kg/m  Physical Exam Vitals and nursing note reviewed.  Constitutional:      Appearance: She is well-developed.  HENT:     Head: Normocephalic.     Nose: Nose normal.  Eyes:     General: No scleral icterus.    Conjunctiva/sclera: Conjunctivae normal.  Neck:     Thyroid: No thyromegaly.  Cardiovascular:     Rate and Rhythm: Normal rate and regular rhythm.     Heart sounds: No murmur heard.    No friction rub. No gallop.  Pulmonary:     Breath sounds: No stridor. No wheezing or rales.  Chest:     Chest wall: No tenderness.  Abdominal:     General: There is no distension.     Tenderness: There is no abdominal tenderness. There is no rebound.  Musculoskeletal:        General: Normal range of motion.     Cervical back:  Neck supple.  Lymphadenopathy:     Cervical: No cervical adenopathy.  Skin:    Findings: No erythema or rash.  Neurological:     Mental Status: She is alert and oriented to person, place, and time.     Motor: No abnormal muscle tone.     Coordination: Coordination normal.  Psychiatric:        Behavior: Behavior normal.     ED Results / Procedures / Treatments   Labs (all labs ordered are listed, but only abnormal results are displayed) Labs Reviewed  COMPREHENSIVE METABOLIC PANEL - Abnormal; Notable for the following components:      Result Value   Potassium 3.1 (*)    CO2 16 (*)    Glucose, Bld 157 (*)    All other components within normal limits  CBC - Abnormal; Notable for the following components:   WBC 27.0 (*)    All other components  within normal limits  URINALYSIS, ROUTINE W REFLEX MICROSCOPIC - Abnormal; Notable for the following components:   Hgb urine dipstick SMALL (*)    Ketones, ur 80 (*)    Protein, ur 30 (*)    All other components within normal limits  HCG, QUANTITATIVE, PREGNANCY - Abnormal; Notable for the following components:   hCG, Beta Chain, Quant, S [id number] (*)    All other components within normal limits  RESP PANEL BY RT-PCR (RSV, FLU A&B, COVID)  RVPGX2  LIPASE, BLOOD    EKG None  Radiology No results found.  Procedures Procedures    Medications Ordered in ED Medications  ondansetron (ZOFRAN) injection 4 mg (4 mg Intravenous Given 09/07/23 0651)  sodium chloride 0.9 % bolus 2,000 mL (2,000 mLs Intravenous New Bag/Given 09/07/23 0743)  pantoprazole (PROTONIX) injection 40 mg (40 mg Intravenous Given 09/07/23 0743)  metoCLOPramide (REGLAN) injection 10 mg (10 mg Intravenous Given 09/07/23 0749)  promethazine (PHENERGAN) 25 mg in sodium chloride 0.9 % 50 mL IVPB (25 mg Intravenous New Bag/Given 09/07/23 1032)    ED Course/ Medical Decision Making/ A&P                                 Medical Decision Making Amount and/or Complexity of Data Reviewed Labs: ordered.  Risk Prescription drug management.  This patient presents to the ED for concern of vomiting, this involves an extensive number of treatment options, and is a complaint that carries with it a high risk of complications and morbidity.  The differential diagnosis includes enteritis, gastroenteritis, appendicitis   Co morbidities that complicate the patient evaluation  Obesity   Additional history obtained:  Additional history obtained from patient External records from outside source obtained and reviewed including health records   Lab Tests:  I Ordered, and personally interpreted labs.  The pertinent results include: White count 27,000 and quantitative beta-hCG is 77,547   Imaging Studies ordered: No  imaging  Cardiac Monitoring: / EKG:  The patient was maintained on a cardiac monitor.  I personally viewed and interpreted the cardiac monitored which showed an underlying rhythm of: Normal   Consultations Obtained:  No consultant Problem List / ED Course / Critical interventions / Medication management  Vomiting I ordered medication including Zofran and Phenergan for vomiting Reevaluation of the patient after these medicines showed that the patient improved I have reviewed the patients home medicines and have made adjustments as needed   Social Determinants of Health:  None   Test /  Admission - Considered:  None  Patient with hyperemesis gravidarum.  She has mild dehydration.  Patient improved with fluids and antiemetics and will follow-up with GYN        Final Clinical Impression(s) / ED Diagnoses Final diagnoses:  Hyperemesis gravidarum    Rx / DC Orders ED Discharge Orders          Ordered    promethazine (PHENERGAN) 25 MG suppository  Every 6 hours PRN        09/07/23 1057              Bethann Berkshire, MD 09/10/23 1032

## 2023-09-27 ENCOUNTER — Other Ambulatory Visit: Payer: Self-pay | Admitting: Obstetrics & Gynecology

## 2023-09-27 DIAGNOSIS — O3680X0 Pregnancy with inconclusive fetal viability, not applicable or unspecified: Secondary | ICD-10-CM

## 2023-09-27 DIAGNOSIS — O219 Vomiting of pregnancy, unspecified: Secondary | ICD-10-CM

## 2023-09-29 ENCOUNTER — Encounter: Payer: Self-pay | Admitting: Radiology

## 2023-09-29 ENCOUNTER — Ambulatory Visit (INDEPENDENT_AMBULATORY_CARE_PROVIDER_SITE_OTHER): Payer: BC Managed Care – PPO | Admitting: Radiology

## 2023-09-29 DIAGNOSIS — Z3A10 10 weeks gestation of pregnancy: Secondary | ICD-10-CM | POA: Diagnosis not present

## 2023-09-29 DIAGNOSIS — O3680X0 Pregnancy with inconclusive fetal viability, not applicable or unspecified: Secondary | ICD-10-CM

## 2023-09-29 DIAGNOSIS — Z3491 Encounter for supervision of normal pregnancy, unspecified, first trimester: Secondary | ICD-10-CM | POA: Diagnosis not present

## 2023-09-29 DIAGNOSIS — O219 Vomiting of pregnancy, unspecified: Secondary | ICD-10-CM

## 2023-09-29 NOTE — Progress Notes (Signed)
 US :    unknown LMP,  check dates  TA images performed Anteverted uterus with single active viable early IUP.  GS intact within mid upper cavity CRL = 37.1 mm  FHR = 168 bpm  [redacted]w[redacted]d   anterior pl, gr0 Normal ovaries - neg adnexal regions - neg CDS - no free fluid present  EDC by todays u/s = 04-22-24

## 2023-10-12 DIAGNOSIS — O219 Vomiting of pregnancy, unspecified: Secondary | ICD-10-CM | POA: Diagnosis present

## 2023-10-12 DIAGNOSIS — Z3A12 12 weeks gestation of pregnancy: Secondary | ICD-10-CM | POA: Diagnosis not present

## 2023-10-12 DIAGNOSIS — Z5321 Procedure and treatment not carried out due to patient leaving prior to being seen by health care provider: Secondary | ICD-10-CM | POA: Diagnosis not present

## 2023-10-13 ENCOUNTER — Other Ambulatory Visit: Payer: Self-pay

## 2023-10-13 ENCOUNTER — Emergency Department (HOSPITAL_COMMUNITY)
Admission: EM | Admit: 2023-10-13 | Discharge: 2023-10-13 | Disposition: A | Discharge status: Home / Self Care | Payer: BC Managed Care – PPO | Attending: Emergency Medicine | Admitting: Emergency Medicine

## 2023-10-13 ENCOUNTER — Encounter (HOSPITAL_COMMUNITY): Payer: Self-pay | Admitting: Emergency Medicine

## 2023-10-13 ENCOUNTER — Encounter (HOSPITAL_COMMUNITY): Payer: Self-pay

## 2023-10-13 ENCOUNTER — Emergency Department (HOSPITAL_COMMUNITY)
Admission: EM | Admit: 2023-10-13 | Discharge: 2023-10-13 | Discharge status: Against Medical Advice | Payer: BC Managed Care – PPO | Source: Home / Self Care

## 2023-10-13 DIAGNOSIS — O219 Vomiting of pregnancy, unspecified: Secondary | ICD-10-CM | POA: Insufficient documentation

## 2023-10-13 DIAGNOSIS — Z5321 Procedure and treatment not carried out due to patient leaving prior to being seen by health care provider: Secondary | ICD-10-CM | POA: Insufficient documentation

## 2023-10-13 DIAGNOSIS — O211 Hyperemesis gravidarum with metabolic disturbance: Secondary | ICD-10-CM | POA: Diagnosis not present

## 2023-10-13 LAB — COMPREHENSIVE METABOLIC PANEL
ALT: 18 U/L (ref 0–44)
ALT: 20 U/L (ref 0–44)
AST: 28 U/L (ref 15–41)
AST: 40 U/L (ref 15–41)
Albumin: 4.1 g/dL (ref 3.5–5.0)
Albumin: 3.9 g/dL (ref 3.5–5.0)
Alkaline Phosphatase: 45 U/L (ref 38–126)
Alkaline Phosphatase: 42 U/L (ref 38–126)
Anion gap: 15 (ref 5–15)
Anion gap: 16 — ABNORMAL HIGH (ref 5–15)
BUN: 16 mg/dL (ref 6–20)
BUN: 12 mg/dL (ref 6–20)
CO2: 16 mmol/L — ABNORMAL LOW (ref 22–32)
CO2: 17 mmol/L — ABNORMAL LOW (ref 22–32)
Calcium: 10 mg/dL (ref 8.9–10.3)
Calcium: 9.3 mg/dL (ref 8.9–10.3)
Chloride: 105 mmol/L (ref 98–111)
Chloride: 100 mmol/L (ref 98–111)
Creatinine, Ser: 0.58 mg/dL (ref 0.44–1.00)
Creatinine, Ser: 0.68 mg/dL (ref 0.44–1.00)
GFR, Estimated: >60 mL/min (ref >60)
GFR, Estimated: >60 mL/min (ref >60)
Glucose, Bld: 158 mg/dL — ABNORMAL HIGH (ref 70–99)
Glucose, Bld: 177 mg/dL — ABNORMAL HIGH (ref 70–99)
Potassium: 3.2 mmol/L — ABNORMAL LOW (ref 3.5–5.1)
Potassium: 2.5 mmol/L — CL (ref 3.5–5.1)
Sodium: 136 mmol/L (ref 135–145)
Sodium: 133 mmol/L — ABNORMAL LOW (ref 135–145)
Total Bilirubin: 0.6 mg/dL (ref 0.0–1.2)
Total Bilirubin: 0.6 mg/dL (ref 0.0–1.2)
Total Protein: 8.1 g/dL (ref 6.5–8.1)
Total Protein: 7.7 g/dL (ref 6.5–8.1)

## 2023-10-13 LAB — CBC
HCT: 37.7 % (ref 36.0–46.0)
Hemoglobin: 13.2 g/dL (ref 12.0–15.0)
MCH: 30.3 pg (ref 26.0–34.0)
MCHC: 35 g/dL (ref 30.0–36.0)
MCV: 86.5 fL (ref 80.0–100.0)
Platelets: 329 10*3/uL (ref 150–400)
RBC: 4.36 MIL/uL (ref 3.87–5.11)
RDW: 13.4 % (ref 11.5–15.5)
WBC: 27.9 10*3/uL — ABNORMAL HIGH (ref 4.0–10.5)
nRBC: 0 % (ref 0.0–0.2)

## 2023-10-13 LAB — URINALYSIS, ROUTINE W REFLEX MICROSCOPIC
Bacteria, UA: NONE SEEN
Bilirubin Urine: NEGATIVE
Glucose, UA: 50 mg/dL — AB
Hgb urine dipstick: NEGATIVE
Ketones, ur: 20 mg/dL — AB
Leukocytes,Ua: NEGATIVE
Nitrite: NEGATIVE
Protein, ur: 100 mg/dL — AB
Specific Gravity, Urine: 1.027 (ref 1.005–1.030)
pH: 6 (ref 5.0–8.0)

## 2023-10-13 LAB — CBC WITH DIFFERENTIAL/PLATELET
Abs Immature Granulocytes: 0.11 10*3/uL — ABNORMAL HIGH (ref 0.00–0.07)
Basophils Absolute: 0 10*3/uL (ref 0.0–0.1)
Basophils Relative: 0 %
Eosinophils Absolute: 0 10*3/uL (ref 0.0–0.5)
Eosinophils Relative: 0 %
HCT: 35.2 % — ABNORMAL LOW (ref 36.0–46.0)
Hemoglobin: 12.5 g/dL (ref 12.0–15.0)
Immature Granulocytes: 1 %
Lymphocytes Relative: 11 %
Lymphs Abs: 2 10*3/uL (ref 0.7–4.0)
MCH: 31.3 pg (ref 26.0–34.0)
MCHC: 35.5 g/dL (ref 30.0–36.0)
MCV: 88.2 fL (ref 80.0–100.0)
Monocytes Absolute: 0.8 10*3/uL (ref 0.1–1.0)
Monocytes Relative: 5 %
Neutro Abs: 15.1 10*3/uL — ABNORMAL HIGH (ref 1.7–7.7)
Neutrophils Relative %: 83 %
Platelets: 301 10*3/uL (ref 150–400)
RBC: 3.99 MIL/uL (ref 3.87–5.11)
RDW: 13.7 % (ref 11.5–15.5)
WBC: 18 10*3/uL — ABNORMAL HIGH (ref 4.0–10.5)
nRBC: 0 % (ref 0.0–0.2)

## 2023-10-13 LAB — LIPASE, BLOOD: Lipase: 23 U/L (ref 11–51)

## 2023-10-13 MED ORDER — SODIUM CHLORIDE 0.9 % IV BOLUS
1000.0000 mL | Freq: Once | INTRAVENOUS | Status: AC
  Administered 2023-10-13: 1000 mL via INTRAVENOUS

## 2023-10-13 MED ORDER — ONDANSETRON HCL 4 MG/2ML IJ SOLN: 4.0000 mg | Freq: Once | INTRAMUSCULAR | Status: DC

## 2023-10-13 MED ORDER — POTASSIUM CHLORIDE 10 MEQ/100ML IV SOLN: 10.0000 meq | INTRAVENOUS | Status: DC

## 2023-10-13 MED ORDER — ONDANSETRON 4 MG PO TBDP
4.0000 mg | ORAL_TABLET | Freq: Once | ORAL | Status: AC | PRN
  Administered 2023-10-13: 4 mg via ORAL

## 2023-10-13 MED ORDER — ONDANSETRON 4 MG PO TBDP
ORAL_TABLET | ORAL | Status: AC
  Filled 2023-10-13: qty 1

## 2023-10-13 MED ORDER — PROCHLORPERAZINE EDISYLATE 10 MG/2ML IJ SOLN
10.0000 mg | Freq: Once | INTRAMUSCULAR | Status: AC
  Administered 2023-10-13: 10 mg via INTRAVENOUS
  Filled 2023-10-13: qty 2

## 2023-10-13 MED ORDER — PROMETHAZINE HCL 25 MG RE SUPP: 25.0000 mg | Freq: Four times a day (QID) | RECTAL | 0 refills | Status: DC | PRN

## 2023-10-13 MED ORDER — MAGNESIUM OXIDE -MG SUPPLEMENT 400 (240 MG) MG PO TABS: 800.0000 mg | ORAL_TABLET | Freq: Once | ORAL | Status: DC

## 2023-10-13 MED ORDER — POTASSIUM CHLORIDE CRYS ER 20 MEQ PO TBCR: 40.0000 meq | EXTENDED_RELEASE_TABLET | Freq: Once | ORAL | Status: DC

## 2023-10-13 MED ORDER — SODIUM CHLORIDE 0.9 % IV BOLUS: 1000.0000 mL | Freq: Once | INTRAVENOUS | Status: DC

## 2023-10-13 MED ORDER — PROCHLORPERAZINE MALEATE 10 MG PO TABS: 10.0000 mg | ORAL_TABLET | Freq: Two times a day (BID) | ORAL | 0 refills | Status: DC | PRN

## 2023-10-13 NOTE — ED Triage Notes (Signed)
Pt here for emesis while pregnancy, pt seen here last night for he same, given nausea PO meds and suppository, pt says she is still vomiting.

## 2023-10-13 NOTE — ED Triage Notes (Signed)
Pt c/o N/V. States that her morning sickness that usually isn't as bad as it has been since 2230 last night.

## 2023-10-13 NOTE — ED Notes (Addendum)
Pt given ginger ale to sip on. Pt tolerated first soda, and requested a second ginger ale. Drink given to pt

## 2023-10-13 NOTE — ED Provider Notes (Signed)
[place] EMERGENCY DEPARTMENT AT [facility]  Provider Note  CSN: [id number] Arrival date & time: 10/12/23 2347  History Chief Complaint  Patient presents with   Emesis    Rachel Sellers is a 33 y.o. female who is approx [redacted]wks pregnant, scheduled for Ob intake next week, reports 24 hours of persistent vomiting. No pain, fever or diarrhea. Has had similar throughout the pregnancy but had been managing symptoms at home. Given zofran in triage without much improvement.    Home Medications Prior to Admission medications   Medication Sig Start Date End Date Taking? Authorizing Provider  prochlorperazine (COMPAZINE) 10 MG tablet Take 1 tablet (10 mg total) by mouth 2 (two) times daily as needed for nausea or vomiting. 10/13/23  Yes Pollyann Savoy, MD  clonazePAM Scarlette Calico) 0.5 MG tablet Take 1 tablet by mouth twice daily as needed for anxiety 07/13/23   Tommie Sams, DO  promethazine (PHENERGAN) 25 MG suppository Place 1 suppository (25 mg total) rectally every 6 (six) hours as needed for nausea or vomiting. 10/13/23   Pollyann Savoy, MD  sertraline (ZOLOFT) 100 MG tablet Take 1 tablet (100 mg total) by mouth daily. 02/10/23   Tommie Sams, DO  traZODone (DESYREL) 50 MG tablet Take 1 tablet (50 mg total) by mouth at bedtime as needed. for sleep 02/10/23   Tommie Sams, DO     Allergies    Gentamicin and Vancomycin   Review of Systems   Review of Systems Please see HPI for pertinent positives and negatives  Physical Exam BP 125/60   Pulse 67   Temp 98.4 F (36.9 C)   Resp 18   Ht 5\' 3"  (1.6 m)   Wt 82 kg   LMP  (LMP Unknown)   SpO2 98%   BMI 32.02 kg/m   Physical Exam Vitals and nursing note reviewed.  Constitutional:      Appearance: Normal appearance.  HENT:     Head: Normocephalic and atraumatic.     Nose: Nose normal.     Mouth/Throat:     Mouth: Mucous membranes are dry.  Eyes:     Extraocular Movements: Extraocular movements intact.      Conjunctiva/sclera: Conjunctivae normal.  Cardiovascular:     Rate and Rhythm: Normal rate.  Pulmonary:     Effort: Pulmonary effort is normal.     Breath sounds: Normal breath sounds.  Abdominal:     General: Abdomen is flat.     Palpations: Abdomen is soft.     Tenderness: There is no abdominal tenderness. There is no guarding.  Musculoskeletal:        General: No swelling. Normal range of motion.     Cervical back: Neck supple.  Skin:    General: Skin is warm and dry.  Neurological:     General: No focal deficit present.     Mental Status: She is alert.  Psychiatric:        Mood and Affect: Mood normal.     ED Results / Procedures / Treatments   EKG None  Procedures Procedures  Medications Ordered in the ED Medications  ondansetron (ZOFRAN-ODT) 4 MG disintegrating tablet (has no administration in time range)  ondansetron (ZOFRAN-ODT) disintegrating tablet 4 mg (4 mg Oral Given 10/13/23 0055)  prochlorperazine (COMPAZINE) injection 10 mg (10 mg Intravenous Given 10/13/23 0212)  sodium chloride 0.9 % bolus 1,000 mL (1,000 mLs Intravenous New Bag/Given 10/13/23 0211)    Initial Impression and Plan  Patient here with 24 hours of vomiting, abdomen is benign, vitals are reassuring. Labs done in triage show CBC with leukocytosis, unchanged from previous. CMP and lipase with mild hypokalemia, otherwise unremarkable. Will give IVF, compazine and reassess for improvement.   ED Course   Clinical Course as of 10/13/23 0355  Wed Oct 13, 2023  0214 UA without signs of infection.  [CS]  [id number] Patient tolerating PO and ready to go home. Rx for Compazine, refill of phenergan suppositories, Ob follow up as scheduled  [CS]    Clinical Course User Index [CS] Pollyann Savoy, MD     MDM Rules/Calculators/A&P Medical Decision Making Problems Addressed: Vomiting during pregnancy: acute illness or injury  Amount and/or Complexity of Data Reviewed Labs: ordered. Decision-making  details documented in ED Course.  Risk Prescription drug management.     Final Clinical Impression(s) / ED Diagnoses Final diagnoses:  Vomiting during pregnancy    Rx / DC Orders ED Discharge Orders          Ordered    promethazine (PHENERGAN) 25 MG suppository  Every 6 hours PRN        10/13/23 0355    prochlorperazine (COMPAZINE) 10 MG tablet  2 times daily PRN        10/13/23 0355             Pollyann Savoy, MD 10/13/23 [phone]

## 2023-10-14 ENCOUNTER — Other Ambulatory Visit: Payer: Self-pay

## 2023-10-14 ENCOUNTER — Encounter: Payer: Self-pay | Admitting: Women's Health

## 2023-10-14 ENCOUNTER — Encounter (HOSPITAL_COMMUNITY): Payer: Self-pay | Admitting: Emergency Medicine

## 2023-10-14 ENCOUNTER — Emergency Department (HOSPITAL_COMMUNITY)
Admission: EM | Admit: 2023-10-14 | Discharge: 2023-10-15 | Disposition: A | Discharge status: Home / Self Care | Payer: BC Managed Care – PPO | Source: Home / Self Care | Attending: Emergency Medicine | Admitting: Emergency Medicine

## 2023-10-14 DIAGNOSIS — D72829 Elevated white blood cell count, unspecified: Secondary | ICD-10-CM | POA: Insufficient documentation

## 2023-10-14 DIAGNOSIS — E876 Hypokalemia: Secondary | ICD-10-CM

## 2023-10-14 DIAGNOSIS — Z3A00 Weeks of gestation of pregnancy not specified: Secondary | ICD-10-CM | POA: Insufficient documentation

## 2023-10-14 DIAGNOSIS — E860 Dehydration: Secondary | ICD-10-CM

## 2023-10-14 DIAGNOSIS — O219 Vomiting of pregnancy, unspecified: Secondary | ICD-10-CM | POA: Insufficient documentation

## 2023-10-14 DIAGNOSIS — O210 Mild hyperemesis gravidarum: Secondary | ICD-10-CM

## 2023-10-14 DIAGNOSIS — Z3400 Encounter for supervision of normal first pregnancy, unspecified trimester: Secondary | ICD-10-CM | POA: Insufficient documentation

## 2023-10-14 NOTE — ED Triage Notes (Signed)
Pt c/o continued emesis. Was given compazine and suppository promethazine without relief.

## 2023-10-15 ENCOUNTER — Encounter (HOSPITAL_COMMUNITY): Payer: Self-pay | Admitting: Emergency Medicine

## 2023-10-15 ENCOUNTER — Other Ambulatory Visit: Payer: Self-pay

## 2023-10-15 ENCOUNTER — Inpatient Hospital Stay (HOSPITAL_COMMUNITY)
Admission: EM | Admit: 2023-10-15 | Discharge: 2023-10-17 | DRG: 833 | Disposition: A | Discharge status: Home / Self Care | Payer: BC Managed Care – PPO | Attending: Family Medicine | Admitting: Family Medicine

## 2023-10-15 ENCOUNTER — Other Ambulatory Visit: Payer: Self-pay | Admitting: Obstetrics & Gynecology

## 2023-10-15 DIAGNOSIS — O219 Vomiting of pregnancy, unspecified: Principal | ICD-10-CM

## 2023-10-15 DIAGNOSIS — O211 Hyperemesis gravidarum with metabolic disturbance: Principal | ICD-10-CM | POA: Diagnosis present

## 2023-10-15 DIAGNOSIS — Z8249 Family history of ischemic heart disease and other diseases of the circulatory system: Secondary | ICD-10-CM

## 2023-10-15 DIAGNOSIS — F319 Bipolar disorder, unspecified: Secondary | ICD-10-CM | POA: Diagnosis present

## 2023-10-15 DIAGNOSIS — O99331 Smoking (tobacco) complicating pregnancy, first trimester: Secondary | ICD-10-CM | POA: Diagnosis present

## 2023-10-15 DIAGNOSIS — F17210 Nicotine dependence, cigarettes, uncomplicated: Secondary | ICD-10-CM | POA: Diagnosis present

## 2023-10-15 DIAGNOSIS — Z3A13 13 weeks gestation of pregnancy: Secondary | ICD-10-CM

## 2023-10-15 DIAGNOSIS — E876 Hypokalemia: Secondary | ICD-10-CM | POA: Diagnosis present

## 2023-10-15 DIAGNOSIS — Z3682 Encounter for antenatal screening for nuchal translucency: Secondary | ICD-10-CM

## 2023-10-15 DIAGNOSIS — Z79899 Other long term (current) drug therapy: Secondary | ICD-10-CM

## 2023-10-15 DIAGNOSIS — R1110 Vomiting, unspecified: Secondary | ICD-10-CM | POA: Insufficient documentation

## 2023-10-15 LAB — CBC WITH DIFFERENTIAL/PLATELET
Abs Immature Granulocytes: 0.05 10*3/uL (ref 0.00–0.07)
Basophils Absolute: 0 10*3/uL (ref 0.0–0.1)
Basophils Relative: 0 %
Eosinophils Absolute: 0 10*3/uL (ref 0.0–0.5)
Eosinophils Relative: 0 %
HCT: 36.2 % (ref 36.0–46.0)
Hemoglobin: 13.2 g/dL (ref 12.0–15.0)
Immature Granulocytes: 0 %
Lymphocytes Relative: 20 %
Lymphs Abs: 2.8 10*3/uL (ref 0.7–4.0)
MCH: 31.3 pg (ref 26.0–34.0)
MCHC: 36.5 g/dL — ABNORMAL HIGH (ref 30.0–36.0)
MCV: 85.8 fL (ref 80.0–100.0)
Monocytes Absolute: 1.1 10*3/uL — ABNORMAL HIGH (ref 0.1–1.0)
Monocytes Relative: 8 %
Neutro Abs: 10.3 10*3/uL — ABNORMAL HIGH (ref 1.7–7.7)
Neutrophils Relative %: 72 %
Platelets: 322 10*3/uL (ref 150–400)
RBC: 4.22 MIL/uL (ref 3.87–5.11)
RDW: 13.2 % (ref 11.5–15.5)
WBC: 14.3 10*3/uL — ABNORMAL HIGH (ref 4.0–10.5)
nRBC: 0 % (ref 0.0–0.2)

## 2023-10-15 LAB — URINALYSIS, ROUTINE W REFLEX MICROSCOPIC
Bacteria, UA: NONE SEEN
Glucose, UA: NEGATIVE mg/dL
Hgb urine dipstick: NEGATIVE
Ketones, ur: 80 mg/dL — AB
Leukocytes,Ua: NEGATIVE
Nitrite: NEGATIVE
Protein, ur: 100 mg/dL — AB
Specific Gravity, Urine: 1.026 (ref 1.005–1.030)
pH: 6 (ref 5.0–8.0)

## 2023-10-15 LAB — COMPREHENSIVE METABOLIC PANEL
ALT: 21 U/L (ref 0–44)
AST: 26 U/L (ref 15–41)
Albumin: 4 g/dL (ref 3.5–5.0)
Alkaline Phosphatase: 43 U/L (ref 38–126)
Anion gap: 14 (ref 5–15)
BUN: 11 mg/dL (ref 6–20)
CO2: 22 mmol/L (ref 22–32)
Calcium: 9.2 mg/dL (ref 8.9–10.3)
Chloride: 96 mmol/L — ABNORMAL LOW (ref 98–111)
Creatinine, Ser: 0.54 mg/dL (ref 0.44–1.00)
GFR, Estimated: >60 mL/min (ref >60)
Glucose, Bld: 123 mg/dL — ABNORMAL HIGH (ref 70–99)
Potassium: 2.6 mmol/L — CL (ref 3.5–5.1)
Sodium: 132 mmol/L — ABNORMAL LOW (ref 135–145)
Total Bilirubin: 1.1 mg/dL (ref 0.0–1.2)
Total Protein: 7.9 g/dL (ref 6.5–8.1)

## 2023-10-15 LAB — LIPASE, BLOOD: Lipase: 30 U/L (ref 11–51)

## 2023-10-15 MED ORDER — SODIUM CHLORIDE 0.9 % IV BOLUS
1000.0000 mL | Freq: Once | INTRAVENOUS | Status: AC
  Administered 2023-10-15: 1000 mL via INTRAVENOUS

## 2023-10-15 MED ORDER — ONDANSETRON HCL 4 MG/2ML IJ SOLN
4.0000 mg | Freq: Once | INTRAMUSCULAR | Status: AC
  Administered 2023-10-15: 4 mg via INTRAVENOUS
  Filled 2023-10-15: qty 2

## 2023-10-15 MED ORDER — POTASSIUM CHLORIDE CRYS ER 20 MEQ PO TBCR
40.0000 meq | EXTENDED_RELEASE_TABLET | Freq: Once | ORAL | Status: DC
  Filled 2023-10-15: qty 2

## 2023-10-15 MED ORDER — ONDANSETRON 4 MG PO TBDP: 4.0000 mg | ORAL_TABLET | Freq: Three times a day (TID) | ORAL | 0 refills | Status: DC | PRN

## 2023-10-15 MED ORDER — POTASSIUM CHLORIDE 20 MEQ PO PACK
40.0000 meq | PACK | Freq: Once | ORAL | Status: AC
  Administered 2023-10-15: 40 meq via ORAL
  Filled 2023-10-15: qty 2

## 2023-10-15 MED ORDER — POTASSIUM CHLORIDE 10 MEQ/100ML IV SOLN
10.0000 meq | Freq: Once | INTRAVENOUS | Status: AC
  Administered 2023-10-15: 10 meq via INTRAVENOUS
  Filled 2023-10-15: qty 100

## 2023-10-15 NOTE — ED Triage Notes (Signed)
Pt with c/o continued emesis. Has been seen here for same recently and states that she is only able to keep a little bit of fluids down.

## 2023-10-15 NOTE — ED Provider Notes (Signed)
0700 AM Assumed care of patient from off-going team. For more details, please see note from same day.  In brief, this is a 33 y.o. female with hyperemesis gravidarum and hypokalemia. Getting IV K.   Plan/Dispo at time of sign-out & ED Course since sign-out: [ ]  potassium supplementation [ ]  fluids [ ]  PO challenge  BP 136/80   Pulse 68   Temp 98.5 F (36.9 C) (Oral)   Resp 20   Ht 5\' 3"  (1.6 m)   Wt 82 kg   LMP  (LMP Unknown)   SpO2 99%   BMI 32.02 kg/m    ED Course:   Clinical Course as of 10/15/23 1050  Fri Oct 15, 2023  0724 Patient reevaluated, stating she feels moiderately improved. PO Challenging. [HN]  [id number] Patient only received 10 mEq of IV potassium, she will need additional repletion, as K is 2.6. She declined potassium pills here as well as rx on dc d/t c/f inability to keep them down. Will try potassium packet liquid here.  [HN]  1050 Patient was able to keep down the potassium supplement packet of 40 mill equivalents by mouth.  She feels improved, is vitally stable, has tolerated solid and liquid p.o., and would like to be discharged.  She has follow-up on Monday with her OB.  Will prescribe ODT Zofran in case she is unable to swallow or use a suppository she has at home.  Patient is given discharge instructions and return precautions, all questions answered to patient satisfaction. [HN]    Clinical Course User Index [HN] Loetta Rough, MD   ------------------------------- Vivi Barrack, MD Emergency Medicine  This note was created using dictation software, which may contain spelling or grammatical errors.   Loetta Rough, MD 10/15/23 1051

## 2023-10-15 NOTE — Discharge Instructions (Addendum)
Thank you for coming to [facility] Emergency Department. You were seen for vomiting in pregnancy. We did an exam, labs, and imaging, and these showed low potassium, which was repleted in the ED. Please eat and drink well at home. Please continue to take your anti-nausea medicines at home. We can also try under the tongue zofran in case you are unable to swallow pills or use the suppository.    Please follow up with your OB on Monday as originally scheduled.   Do not hesitate to return to the ED or call 911 if you experience: -Worsening symptoms -Chest pain, shortness of breath -Lightheadedness, passing out -Fevers/chills -Anything else that concerns you

## 2023-10-15 NOTE — ED Provider Notes (Signed)
[place] EMERGENCY DEPARTMENT AT [facility] Provider Note   CSN: [id number] Arrival date & time: 10/14/23  2110     History  Chief Complaint  Patient presents with   Emesis During Pregnancy    Rachel Sellers is a 33 y.o. female.  Patient is a 33 year old female G1 at estimated 3 months gestation.  Patient presenting today with complaints of nausea and vomiting.  She was seen 2 days ago with the same complaint.  She was given IV fluids and IV medications and seems to be feeling somewhat better.  She was sent home with Compazine and Phenergan suppositories, however these are not helping.  Patient states that she is unable to take any food or liquid for the past 2 days.  She denies to me she is having any abdominal pain.  No fevers or chills.  No ill contacts.  The history is provided by the patient.       Home Medications Prior to Admission medications   Medication Sig Start Date End Date Taking? Authorizing Provider  clonazePAM (KLONOPIN) 0.5 MG tablet Take 1 tablet by mouth twice daily as needed for anxiety 07/13/23   Tommie Sams, DO  prochlorperazine (COMPAZINE) 10 MG tablet Take 1 tablet (10 mg total) by mouth 2 (two) times daily as needed for nausea or vomiting. 10/13/23   Pollyann Savoy, MD  promethazine (PHENERGAN) 25 MG suppository Place 1 suppository (25 mg total) rectally every 6 (six) hours as needed for nausea or vomiting. 10/13/23   Pollyann Savoy, MD  sertraline (ZOLOFT) 100 MG tablet Take 1 tablet (100 mg total) by mouth daily. 02/10/23   Tommie Sams, DO  traZODone (DESYREL) 50 MG tablet Take 1 tablet (50 mg total) by mouth at bedtime as needed. for sleep 02/10/23   Tommie Sams, DO      Allergies    Gentamicin and Vancomycin    Review of Systems   Review of Systems  All other systems reviewed and are negative.   Physical Exam Updated Vital Signs BP 119/72   Pulse 69   Temp 98.2 F (36.8 C)   Resp 18   Ht 5\' 3"  (1.6 m)   Wt 82 kg    LMP  (LMP Unknown)   SpO2 97%   BMI 32.02 kg/m  Physical Exam Vitals and nursing note reviewed.  Constitutional:      General: She is not in acute distress.    Appearance: She is well-developed. She is not diaphoretic.  HENT:     Head: Normocephalic and atraumatic.  Cardiovascular:     Rate and Rhythm: Normal rate and regular rhythm.     Heart sounds: No murmur heard.    No friction rub. No gallop.  Pulmonary:     Effort: Pulmonary effort is normal. No respiratory distress.     Breath sounds: Normal breath sounds. No wheezing.  Abdominal:     General: Bowel sounds are normal. There is no distension.     Palpations: Abdomen is soft.     Tenderness: There is no abdominal tenderness.  Musculoskeletal:        General: Normal range of motion.     Cervical back: Normal range of motion and neck supple.  Skin:    General: Skin is warm and dry.  Neurological:     General: No focal deficit present.     Mental Status: She is alert and oriented to person, place, and time.  ED Results / Procedures / Treatments   Labs (all labs ordered are listed, but only abnormal results are displayed) Labs Reviewed  COMPREHENSIVE METABOLIC PANEL  LIPASE, BLOOD  CBC WITH DIFFERENTIAL/PLATELET  URINALYSIS, ROUTINE W REFLEX MICROSCOPIC    EKG None  Radiology No results found.  Procedures Procedures  {Document cardiac monitor, telemetry assessment procedure when appropriate:1}  Medications Ordered in ED Medications  sodium chloride 0.9 % bolus 1,000 mL (has no administration in time range)  ondansetron (ZOFRAN) injection 4 mg (has no administration in time range)    ED Course/ Medical Decision Making/ A&P   {   Click here for ABCD2, HEART and other calculatorsREFRESH Note before signing :1}                              Medical Decision Making Amount and/or Complexity of Data Reviewed Labs: ordered.  Risk Prescription drug management.   ***  {Document critical care time  when appropriate:1} {Document review of labs and clinical decision tools ie heart score, Chads2Vasc2 etc:1}  {Document your independent review of radiology images, and any outside records:1} {Document your discussion with family members, caretakers, and with consultants:1} {Document social determinants of health affecting pt's care:1} {Document your decision making why or why not admission, treatments were needed:1} Final Clinical Impression(s) / ED Diagnoses Final diagnoses:  None    Rx / DC Orders ED Discharge Orders     None

## 2023-10-15 NOTE — ED Notes (Signed)
Attempted to give oral K+ pt requesting liquid. MD made aware.

## 2023-10-15 NOTE — ED Notes (Signed)
Failed PO challenge. EDP notified

## 2023-10-15 NOTE — ED Notes (Signed)
 ED Provider at bedside.

## 2023-10-16 DIAGNOSIS — Z8249 Family history of ischemic heart disease and other diseases of the circulatory system: Secondary | ICD-10-CM | POA: Diagnosis not present

## 2023-10-16 DIAGNOSIS — R1110 Vomiting, unspecified: Secondary | ICD-10-CM

## 2023-10-16 DIAGNOSIS — E876 Hypokalemia: Secondary | ICD-10-CM

## 2023-10-16 DIAGNOSIS — O211 Hyperemesis gravidarum with metabolic disturbance: Secondary | ICD-10-CM | POA: Diagnosis present

## 2023-10-16 DIAGNOSIS — F17210 Nicotine dependence, cigarettes, uncomplicated: Secondary | ICD-10-CM | POA: Diagnosis present

## 2023-10-16 DIAGNOSIS — O99331 Smoking (tobacco) complicating pregnancy, first trimester: Secondary | ICD-10-CM | POA: Diagnosis present

## 2023-10-16 DIAGNOSIS — F319 Bipolar disorder, unspecified: Secondary | ICD-10-CM | POA: Diagnosis present

## 2023-10-16 DIAGNOSIS — Z79899 Other long term (current) drug therapy: Secondary | ICD-10-CM | POA: Diagnosis not present

## 2023-10-16 DIAGNOSIS — Z3A13 13 weeks gestation of pregnancy: Secondary | ICD-10-CM

## 2023-10-16 HISTORY — DX: Vomiting, unspecified: R11.10

## 2023-10-16 HISTORY — DX: Hypokalemia: E87.6

## 2023-10-16 LAB — CBC
HCT: 33.9 % — ABNORMAL LOW (ref 36.0–46.0)
Hemoglobin: 12.1 g/dL (ref 12.0–15.0)
MCH: 30.9 pg (ref 26.0–34.0)
MCHC: 35.7 g/dL (ref 30.0–36.0)
MCV: 86.7 fL (ref 80.0–100.0)
Platelets: 273 10*3/uL (ref 150–400)
RBC: 3.91 MIL/uL (ref 3.87–5.11)
RDW: 13.2 % (ref 11.5–15.5)
WBC: 15.6 10*3/uL — ABNORMAL HIGH (ref 4.0–10.5)
nRBC: 0 % (ref 0.0–0.2)

## 2023-10-16 LAB — CBC WITH DIFFERENTIAL/PLATELET
Abs Immature Granulocytes: 0.13 10*3/uL — ABNORMAL HIGH (ref 0.00–0.07)
Basophils Absolute: 0 10*3/uL (ref 0.0–0.1)
Basophils Relative: 0 %
Eosinophils Absolute: 0 10*3/uL (ref 0.0–0.5)
Eosinophils Relative: 0 %
HCT: 33.5 % — ABNORMAL LOW (ref 36.0–46.0)
Hemoglobin: 11.9 g/dL — ABNORMAL LOW (ref 12.0–15.0)
Immature Granulocytes: 1 %
Lymphocytes Relative: 11 %
Lymphs Abs: 2.2 10*3/uL (ref 0.7–4.0)
MCH: 31.2 pg (ref 26.0–34.0)
MCHC: 35.5 g/dL (ref 30.0–36.0)
MCV: 87.7 fL (ref 80.0–100.0)
Monocytes Absolute: 1 10*3/uL (ref 0.1–1.0)
Monocytes Relative: 5 %
Neutro Abs: 16.8 10*3/uL — ABNORMAL HIGH (ref 1.7–7.7)
Neutrophils Relative %: 83 %
Platelets: 287 10*3/uL (ref 150–400)
RBC: 3.82 MIL/uL — ABNORMAL LOW (ref 3.87–5.11)
RDW: 13.2 % (ref 11.5–15.5)
WBC: 20.1 10*3/uL — ABNORMAL HIGH (ref 4.0–10.5)
nRBC: 0 % (ref 0.0–0.2)

## 2023-10-16 LAB — RAPID URINE DRUG SCREEN, HOSP PERFORMED
Amphetamines: NOT DETECTED
Barbiturates: NOT DETECTED
Benzodiazepines: NOT DETECTED
Cocaine: NOT DETECTED
Opiates: NOT DETECTED
Tetrahydrocannabinol: POSITIVE — AB

## 2023-10-16 LAB — BASIC METABOLIC PANEL
Anion gap: 12 (ref 5–15)
Anion gap: 13 (ref 5–15)
BUN: 5 mg/dL — ABNORMAL LOW (ref 6–20)
BUN: 7 mg/dL (ref 6–20)
CO2: 20 mmol/L — ABNORMAL LOW (ref 22–32)
CO2: 20 mmol/L — ABNORMAL LOW (ref 22–32)
Calcium: 8.7 mg/dL — ABNORMAL LOW (ref 8.9–10.3)
Calcium: 8.8 mg/dL — ABNORMAL LOW (ref 8.9–10.3)
Chloride: 103 mmol/L (ref 98–111)
Chloride: 103 mmol/L (ref 98–111)
Creatinine, Ser: 0.47 mg/dL (ref 0.44–1.00)
Creatinine, Ser: 0.65 mg/dL (ref 0.44–1.00)
GFR, Estimated: >60 mL/min (ref >60)
GFR, Estimated: >60 mL/min (ref >60)
Glucose, Bld: 102 mg/dL — ABNORMAL HIGH (ref 70–99)
Glucose, Bld: 147 mg/dL — ABNORMAL HIGH (ref 70–99)
Potassium: 2.5 mmol/L — CL (ref 3.5–5.1)
Potassium: 3.1 mmol/L — ABNORMAL LOW (ref 3.5–5.1)
Sodium: 135 mmol/L (ref 135–145)
Sodium: 136 mmol/L (ref 135–145)

## 2023-10-16 LAB — RESP PANEL BY RT-PCR (RSV, FLU A&B, COVID)  RVPGX2
Influenza A by PCR: NEGATIVE
Influenza B by PCR: NEGATIVE
Resp Syncytial Virus by PCR: NEGATIVE
SARS Coronavirus 2 by RT PCR: NEGATIVE

## 2023-10-16 LAB — CREATININE, SERUM
Creatinine, Ser: 0.48 mg/dL (ref 0.44–1.00)
GFR, Estimated: >60 mL/min (ref >60)

## 2023-10-16 LAB — T4, FREE: Free T4: 1.07 ng/dL (ref 0.61–1.12)

## 2023-10-16 LAB — TSH: TSH: 0.724 u[IU]/mL (ref 0.350–4.500)

## 2023-10-16 LAB — MAGNESIUM: Magnesium: 1.8 mg/dL (ref 1.7–2.4)

## 2023-10-16 MED ORDER — ENOXAPARIN SODIUM 40 MG/0.4ML IJ SOSY
40.0000 mg | PREFILLED_SYRINGE | INTRAMUSCULAR | Status: DC
  Filled 2023-10-16: qty 0.4

## 2023-10-16 MED ORDER — METOCLOPRAMIDE HCL 5 MG/ML IJ SOLN
10.0000 mg | Freq: Once | INTRAMUSCULAR | Status: AC
  Administered 2023-10-16: 10 mg via INTRAVENOUS
  Filled 2023-10-16: qty 2

## 2023-10-16 MED ORDER — CALCIUM CARBONATE ANTACID 500 MG PO CHEW: 2.0000 | CHEWABLE_TABLET | ORAL | Status: DC | PRN

## 2023-10-16 MED ORDER — LACTATED RINGERS IV SOLN: 125.0000 mL/h | INTRAVENOUS | Status: AC

## 2023-10-16 MED ORDER — ONDANSETRON HCL 4 MG/2ML IJ SOLN
4.0000 mg | Freq: Four times a day (QID) | INTRAMUSCULAR | Status: DC
  Administered 2023-10-16 – 2023-10-17 (×6): 4 mg via INTRAVENOUS
  Filled 2023-10-16 (×6): qty 2

## 2023-10-16 MED ORDER — METOCLOPRAMIDE HCL 5 MG/ML IJ SOLN
10.0000 mg | Freq: Four times a day (QID) | INTRAMUSCULAR | Status: DC
  Administered 2023-10-16 – 2023-10-17 (×6): 10 mg via INTRAVENOUS
  Filled 2023-10-16 (×6): qty 2

## 2023-10-16 MED ORDER — PRENATAL MULTIVITAMIN CH
1.0000 | ORAL_TABLET | Freq: Every day | ORAL | Status: DC
  Filled 2023-10-16: qty 1

## 2023-10-16 MED ORDER — LACTATED RINGERS IV SOLN: INTRAVENOUS | Status: AC

## 2023-10-16 MED ORDER — DOCUSATE SODIUM 100 MG PO CAPS
100.0000 mg | ORAL_CAPSULE | Freq: Every day | ORAL | Status: DC
  Administered 2023-10-17: 100 mg via ORAL
  Filled 2023-10-16: qty 1

## 2023-10-16 MED ORDER — ACETAMINOPHEN 325 MG PO TABS
650.0000 mg | ORAL_TABLET | ORAL | Status: DC | PRN
Start: 2023-10-16 — End: 2023-10-17

## 2023-10-16 MED ORDER — SCOPOLAMINE 1 MG/3DAYS TD PT72SCOPOLAMINE 1 MG/3DAYS
1.0000 | MEDICATED_PATCH | TRANSDERMAL | Status: DC
Start: 2023-10-16 — End: 2023-10-17
  Administered 2023-10-16: 1.5 mg via TRANSDERMAL
  Filled 2023-10-16: qty 1

## 2023-10-16 MED ORDER — TRAZODONE HCL 50 MG PO TABS: 50.0000 mg | ORAL_TABLET | Freq: Every evening | ORAL | Status: DC | PRN

## 2023-10-16 MED ORDER — PROCHLORPERAZINE EDISYLATE 10 MG/2ML IJ SOLN: 10.0000 mg | Freq: Four times a day (QID) | INTRAMUSCULAR | Status: DC | PRN

## 2023-10-16 MED ORDER — THIAMINE HCL 100 MG/ML IJ SOLN
100.0000 mg | Freq: Once | INTRAMUSCULAR | Status: AC
  Administered 2023-10-16: 100 mg via INTRAVENOUS
  Filled 2023-10-16 (×2): qty 2

## 2023-10-16 MED ORDER — SODIUM CHLORIDE 0.9 % IV SOLN
25.0000 mg | Freq: Four times a day (QID) | INTRAVENOUS | Status: DC | PRN
  Administered 2023-10-16: 25 mg via INTRAVENOUS
  Filled 2023-10-16: qty 1

## 2023-10-16 MED ORDER — BETAMETHASONE SOD PHOS & ACET 6 (3-3) MG/ML IJ SUSP
12.0000 mg | INTRAMUSCULAR | Status: DC
  Administered 2023-10-16: 12 mg via INTRAMUSCULAR
  Filled 2023-10-16: qty 5

## 2023-10-16 MED ORDER — SERTRALINE HCL 50 MG PO TABS
100.0000 mg | ORAL_TABLET | Freq: Every day | ORAL | Status: DC
  Administered 2023-10-17: 100 mg via ORAL
  Filled 2023-10-16: qty 2

## 2023-10-16 MED ORDER — POTASSIUM CHLORIDE 10 MEQ/100ML IV SOLN
10.0000 meq | INTRAVENOUS | Status: AC
  Administered 2023-10-16 (×2): 10 meq via INTRAVENOUS
  Filled 2023-10-16 (×2): qty 100

## 2023-10-16 MED ORDER — POTASSIUM CHLORIDE 10 MEQ/100ML IV SOLN
10.0000 meq | INTRAVENOUS | Status: AC
Start: 2023-10-16 — End: 2023-10-16
  Administered 2023-10-16 (×3): 10 meq via INTRAVENOUS
  Filled 2023-10-16 (×3): qty 100

## 2023-10-16 MED ORDER — POTASSIUM CHLORIDE CRYS ER 20 MEQ PO TBCR: 20.0000 meq | EXTENDED_RELEASE_TABLET | Freq: Three times a day (TID) | ORAL | Status: DC

## 2023-10-16 MED ORDER — PROMETHAZINE HCL 25 MG/ML IJ SOLN
INTRAMUSCULAR | Status: AC
  Filled 2023-10-16: qty 1

## 2023-10-16 NOTE — ED Notes (Signed)
Pt informed that a urine sample is needed, pt had just used the restroom and sample wasn't collected. Will try again shortly.

## 2023-10-16 NOTE — Plan of Care (Signed)
  Problem: Education: Goal: Knowledge of General Education information will improve Description: Including pain rating scale, medication(s)/side effects and non-pharmacologic comfort measures Outcome: Progressing   Problem: Health Behavior/Discharge Planning: Goal: Ability to manage health-related needs will improve Outcome: Progressing   Problem: Clinical Measurements: Goal: Ability to maintain clinical measurements within normal limits will improve Outcome: Progressing Goal: Will remain free from infection Outcome: Progressing Goal: Diagnostic test results will improve Outcome: Progressing Goal: Respiratory complications will improve Outcome: Progressing Goal: Cardiovascular complication will be avoided Outcome: Progressing   Problem: Activity: Goal: Risk for activity intolerance will decrease Outcome: Progressing   Problem: Nutrition: Goal: Adequate nutrition will be maintained Outcome: Progressing   Problem: Coping: Goal: Level of anxiety will decrease Outcome: Progressing   Problem: Elimination: Goal: Will not experience complications related to bowel motility Outcome: Progressing Goal: Will not experience complications related to urinary retention Outcome: Progressing   Problem: Pain Managment: Goal: General experience of comfort will improve and/or be controlled Outcome: Progressing   Problem: Safety: Goal: Ability to remain free from injury will improve Outcome: Progressing   Problem: Skin Integrity: Goal: Risk for impaired skin integrity will decrease Outcome: Progressing   Problem: Education: Goal: Knowledge of disease or condition will improve Outcome: Progressing Goal: Knowledge of the prescribed therapeutic regimen will improve Outcome: Progressing Goal: Individualized Educational Video(s) Outcome: Progressing   Problem: Clinical Measurements: Goal: Complications related to the disease process, condition or treatment will be avoided or  minimized Outcome: Progressing   Problem: Education: Goal: Knowledge of disease or condition will improve Outcome: Progressing Goal: Knowledge of the prescribed therapeutic regimen will improve Outcome: Progressing   Problem: Bowel/Gastric: Goal: Occurences of nausea and/or vomiting will decrease Outcome: Progressing   Problem: Fluid Volume: Goal: Maintenance of adequate hydration will improve Outcome: Progressing   Problem: Nutritional: Goal: Achievement of adequate weight for body size and type will improve Outcome: Progressing

## 2023-10-16 NOTE — Plan of Care (Signed)
Problem: Education: Goal: Knowledge of General Education information will improve Description: Including pain rating scale, medication(s)/side effects and non-pharmacologic comfort measures 10/16/2023 1045 by Earlyne Iba, RN Outcome: Progressing 10/16/2023 1044 by Earlyne Iba, RN Outcome: Progressing   Problem: Health Behavior/Discharge Planning: Goal: Ability to manage health-related needs will improve 10/16/2023 1045 by Earlyne Iba, RN Outcome: Progressing 10/16/2023 1044 by Earlyne Iba, RN Outcome: Progressing   Problem: Clinical Measurements: Goal: Ability to maintain clinical measurements within normal limits will improve 10/16/2023 1045 by Earlyne Iba, RN Outcome: Progressing 10/16/2023 1044 by Earlyne Iba, RN Outcome: Progressing Goal: Will remain free from infection 10/16/2023 1045 by Earlyne Iba, RN Outcome: Progressing 10/16/2023 1044 by Earlyne Iba, RN Outcome: Progressing Goal: Diagnostic test results will improve 10/16/2023 1045 by Earlyne Iba, RN Outcome: Progressing 10/16/2023 1044 by Earlyne Iba, RN Outcome: Progressing Goal: Respiratory complications will improve 10/16/2023 1045 by Earlyne Iba, RN Outcome: Progressing 10/16/2023 1044 by Earlyne Iba, RN Outcome: Progressing Goal: Cardiovascular complication will be avoided 10/16/2023 1045 by Earlyne Iba, RN Outcome: Progressing 10/16/2023 1044 by Earlyne Iba, RN Outcome: Progressing   Problem: Activity: Goal: Risk for activity intolerance will decrease 10/16/2023 1045 by Earlyne Iba, RN Outcome: Progressing 10/16/2023 1044 by Earlyne Iba, RN Outcome: Progressing   Problem: Nutrition: Goal: Adequate nutrition will be maintained 10/16/2023 1045 by Earlyne Iba, RN Outcome: Progressing 10/16/2023 1044 by Earlyne Iba, RN Outcome: Progressing   Problem: Coping: Goal: Level of anxiety will decrease 10/16/2023  1045 by Earlyne Iba, RN Outcome: Progressing 10/16/2023 1044 by Earlyne Iba, RN Outcome: Progressing   Problem: Elimination: Goal: Will not experience complications related to bowel motility 10/16/2023 1045 by Earlyne Iba, RN Outcome: Progressing 10/16/2023 1044 by Earlyne Iba, RN Outcome: Progressing Goal: Will not experience complications related to urinary retention 10/16/2023 1045 by Earlyne Iba, RN Outcome: Progressing 10/16/2023 1044 by Earlyne Iba, RN Outcome: Progressing   Problem: Pain Managment: Goal: General experience of comfort will improve and/or be controlled 10/16/2023 1045 by Earlyne Iba, RN Outcome: Progressing 10/16/2023 1044 by Earlyne Iba, RN Outcome: Progressing   Problem: Safety: Goal: Ability to remain free from injury will improve 10/16/2023 1045 by Earlyne Iba, RN Outcome: Progressing 10/16/2023 1044 by Earlyne Iba, RN Outcome: Progressing   Problem: Skin Integrity: Goal: Risk for impaired skin integrity will decrease 10/16/2023 1045 by Earlyne Iba, RN Outcome: Progressing 10/16/2023 1044 by Earlyne Iba, RN Outcome: Progressing   Problem: Education: Goal: Knowledge of disease or condition will improve 10/16/2023 1045 by Earlyne Iba, RN Outcome: Progressing 10/16/2023 1044 by Earlyne Iba, RN Outcome: Progressing Goal: Knowledge of the prescribed therapeutic regimen will improve 10/16/2023 1045 by Earlyne Iba, RN Outcome: Progressing 10/16/2023 1044 by Earlyne Iba, RN Outcome: Progressing Goal: Individualized Educational Video(s) 10/16/2023 1045 by Earlyne Iba, RN Outcome: Progressing 10/16/2023 1044 by Earlyne Iba, RN Outcome: Progressing   Problem: Clinical Measurements: Goal: Complications related to the disease process, condition or treatment will be avoided or minimized 10/16/2023 1045 by Earlyne Iba, RN Outcome: Progressing 10/16/2023  1044 by Earlyne Iba, RN Outcome: Progressing   Problem: Education: Goal: Knowledge of disease or condition will improve 10/16/2023 1045 by Earlyne Iba, RN Outcome: Progressing 10/16/2023 1044 by Earlyne Iba, RN Outcome: Progressing Goal: Knowledge of the prescribed therapeutic regimen will improve 10/16/2023 1045 by Teena Mangus,  Godfrey Pick, RN Outcome: Progressing 10/16/2023 1044 by Earlyne Iba, RN Outcome: Progressing   Problem: Bowel/Gastric: Goal: Occurences of nausea and/or vomiting will decrease 10/16/2023 1045 by Earlyne Iba, RN Outcome: Progressing 10/16/2023 1044 by Earlyne Iba, RN Outcome: Progressing   Problem: Fluid Volume: Goal: Maintenance of adequate hydration will improve 10/16/2023 1045 by Earlyne Iba, RN Outcome: Progressing 10/16/2023 1044 by Earlyne Iba, RN Outcome: Progressing   Problem: Nutritional: Goal: Achievement of adequate weight for body size and type will improve 10/16/2023 1045 by Earlyne Iba, RN Outcome: Progressing 10/16/2023 1044 by Earlyne Iba, RN Outcome: Progressing

## 2023-10-16 NOTE — ED Notes (Signed)
Pt picked up by [facility] for transport to [facility]

## 2023-10-16 NOTE — ED Provider Notes (Signed)
EMERGENCY DEPARTMENT AT [facility] Provider Note   CSN: [id number] Arrival date & time: 10/15/23  2344     History  Chief Complaint  Patient presents with   Emesis    Rachel Sellers is a 33 y.o. female.  HPI     This is a 33 year old female G1 approximately [redacted] weeks pregnant who presents with ongoing nausea and vomiting.  Has been seen and evaluated twice since 1/29 for the same symptoms.  She denies any abdominal pain, cramping, loss of fluids, vaginal bleeding.  She has been taking Phenergan suppositories at home with minimal relief.  Little to no oral intake.  Initially she denied recent marijuana use but did report that she hit a vape of CBD to try to help with the nausea.  She has stopped smoking otherwise.    Ultrasound on 1/15 showed an estimated delivery date of 04/22/2024.  Home Medications Prior to Admission medications   Medication Sig Start Date End Date Taking? Authorizing Provider  clonazePAM (KLONOPIN) 0.5 MG tablet Take 1 tablet by mouth twice daily as needed for anxiety 07/13/23   Everlene Other G, DO  ondansetron (ZOFRAN-ODT) 4 MG disintegrating tablet Take 1 tablet (4 mg total) by mouth every 8 (eight) hours as needed. 10/15/23   Loetta Rough, MD  prochlorperazine (COMPAZINE) 10 MG tablet Take 1 tablet (10 mg total) by mouth 2 (two) times daily as needed for nausea or vomiting. 10/13/23   Pollyann Savoy, MD  promethazine (PHENERGAN) 25 MG suppository Place 1 suppository (25 mg total) rectally every 6 (six) hours as needed for nausea or vomiting. 10/13/23   Pollyann Savoy, MD  sertraline (ZOLOFT) 100 MG tablet Take 1 tablet (100 mg total) by mouth daily. 02/10/23   Tommie Sams, DO  traZODone (DESYREL) 50 MG tablet Take 1 tablet (50 mg total) by mouth at bedtime as needed. for sleep 02/10/23   Tommie Sams, DO      Allergies    Gentamicin and Vancomycin    Review of Systems   Review of Systems  Constitutional:  Negative for fever.   Cardiovascular:  Negative for chest pain.  Gastrointestinal:  Positive for nausea and vomiting. Negative for abdominal pain.  Genitourinary:  Negative for dysuria.  All other systems reviewed and are negative.   Physical Exam Updated Vital Signs BP 114/89   Pulse 77   Temp 98.1 F (36.7 C) (Oral)   Resp (!) 26   Ht 1.6 m (5\' 3" )   Wt 82 kg   LMP  (LMP Unknown)   SpO2 97%   BMI 32.02 kg/m  Physical Exam Vitals and nursing note reviewed.  Constitutional:      Appearance: She is well-developed. She is obese. She is not ill-appearing.  HENT:     Head: Normocephalic and atraumatic.     Mouth/Throat:     Mouth: Mucous membranes are dry.  Eyes:     Pupils: Pupils are equal, round, and reactive to light.  Cardiovascular:     Rate and Rhythm: Normal rate and regular rhythm.     Heart sounds: Normal heart sounds.  Pulmonary:     Effort: Pulmonary effort is normal. No respiratory distress.     Breath sounds: No wheezing.  Abdominal:     General: Bowel sounds are normal.     Palpations: Abdomen is soft.     Tenderness: There is no abdominal tenderness. There is no guarding or rebound.  Musculoskeletal:  Cervical back: Neck supple.  Skin:    General: Skin is warm and dry.  Neurological:     Mental Status: She is alert and oriented to person, place, and time.  Psychiatric:        Mood and Affect: Mood normal.     ED Results / Procedures / Treatments   Labs (all labs ordered are listed, but only abnormal results are displayed) Labs Reviewed  BASIC METABOLIC PANEL - Abnormal; Notable for the following components:      Result Value   Potassium 2.5 (*)    CO2 20 (*)    Glucose, Bld 147 (*)    Calcium 8.7 (*)    All other components within normal limits  CBC WITH DIFFERENTIAL/PLATELET - Abnormal; Notable for the following components:   WBC 20.1 (*)    RBC 3.82 (*)    Hemoglobin 11.9 (*)    HCT 33.5 (*)    Neutro Abs 16.8 (*)    Abs Immature Granulocytes 0.13 (*)     All other components within normal limits  RESP PANEL BY RT-PCR (RSV, FLU A&B, COVID)  [id number]  MAGNESIUM  URINALYSIS, ROUTINE W REFLEX MICROSCOPIC  RAPID URINE DRUG SCREEN, HOSP PERFORMED    EKG EKG Interpretation Date/Time:  Saturday October 16 2023 00:40:59 EST Ventricular Rate:  64 PR Interval:  126 QRS Duration:  102 QT Interval:  439 QTC Calculation: 453 R Axis:   1  Text Interpretation: Sinus arrhythmia Probable left ventricular hypertrophy Confirmed by Ross Marcus ([id number]) on 10/16/2023 1:32:32 AM  Radiology No results found.  Procedures Procedures    Medications Ordered in ED Medications  potassium chloride 10 mEq in 100 mL IVPB (10 mEq Intravenous New Bag/Given 10/16/23 0114)  metoCLOPramide (REGLAN) injection 10 mg (10 mg Intravenous Given 10/16/23 0015)  thiamine (VITAMIN B1) injection 100 mg (100 mg Intravenous Given 10/16/23 0014)    ED Course/ Medical Decision Making/ A&P Clinical Course as of 10/16/23 0207  Sat Oct 16, 2023  0140 Spoke to Dr. Despina Hidden.  We discussed this case.  He does recommend transfer to women's for admission for fluids and electrolyte replacement.  Queried ongoing marijuana usage. [CH]  0145 Spoke with patient.  Initially she stated that she had not smoked marijuana in some time; however then endorsed that she had hit a vape of CBD as late as yesterday for nausea. [CH]  0204 Spoke to Dr. Shawnie Pons, [facility].  Bed order placed under her name per her request. [CH]    Clinical Course User Index [CH] Roderick Sweezy, Mayer Masker, MD                                 Medical Decision Making Amount and/or Complexity of Data Reviewed Labs: ordered.  Risk Prescription drug management.   This patient presents to the ED for concern of nausea and vomiting, this involves an extensive number of treatment options, and is a complaint that carries with it a high risk of complications and morbidity.  I considered the following differential and admission for  this acute, potentially life threatening condition.  The differential diagnosis includes viral illness such as gastroenteritis for the flu, hyperemesis, CBD  MDM:    This is a 33 year old female who presents with nausea and vomiting during pregnancy.  Acutely worsened over the last 3 to 4 days.  This is her third ED visit.  She is taking appropriate medications at home without  relief.  She did finally endorse hitting a CBD vape pen which could be contributing.  However she is persistently hypokalemic at 2.5.  No EKG changes.  She has had multiple rounds of IV potassium during her prior visits as well as IV fluids.  See discussions with OB/GYN above.  Will plan for admission for fluids and metabolite replacement.  She did previously have an ultrasound that showed a viable IUP on 1/15.  (Labs, imaging, consults)  Labs: I Ordered, and personally interpreted labs.  The pertinent results include: CBC, BMP, urinalysis, UDS  Imaging Studies ordered: I ordered imaging studies including none I independently visualized and interpreted imaging. I agree with the radiologist interpretation  Additional history obtained from chart review.  External records from outside source obtained and reviewed including ultrasound records  Cardiac Monitoring: The patient was maintained on a cardiac monitor.  If on the cardiac monitor, I personally viewed and interpreted the cardiac monitored which showed an underlying rhythm of: Sinus  Reevaluation: After the interventions noted above, I reevaluated the patient and found that they have :stayed the same  Social Determinants of Health:  lives independently  Disposition: Admit to women's  Co morbidities that complicate the patient evaluation  Past Medical History:  Diagnosis Date   Anemia    Anxiety    Bipolar 1 disorder (HCC)    Depression    History of kidney stones    OCD (obsessive compulsive disorder)    PONV (postoperative nausea and vomiting)     Renal disorder    kidney stones   Vaginal Pap smear, abnormal      Medicines Meds ordered this encounter  Medications   metoCLOPramide (REGLAN) injection 10 mg   thiamine (VITAMIN B1) injection 100 mg   potassium chloride 10 mEq in 100 mL IVPB    I have reviewed the patients home medicines and have made adjustments as needed  Problem List / ED Course: Problem List Items Addressed This Visit       Other   Hypokalemia   Other Visit Diagnoses       Vomiting pregnancy    -  Primary                   Final Clinical Impression(s) / ED Diagnoses Final diagnoses:  Vomiting pregnancy  Hypokalemia    Rx / DC Orders ED Discharge Orders     None         Shon Baton, MD 10/16/23 0207

## 2023-10-16 NOTE — H&P (Signed)
Rachel Sellers is an 33 y.o. G1P0000 female.   Chief Complaint: N/V HPI: G1 with N/V on Zofran and phenergan. 3 visits to [facility] ED in 24-48 hours. Severe hypokalemia, unable to correct. Still with N/V.  Past Medical History:  Diagnosis Date   Anemia    Anxiety    Bipolar 1 disorder (HCC)    Depression    History of kidney stones    OCD (obsessive compulsive disorder)    PONV (postoperative nausea and vomiting)    Renal disorder    kidney stones   Vaginal Pap smear, abnormal     Past Surgical History:  Procedure Laterality Date   ANAL FISTULOTOMY N/A 07/31/2022   Procedure: EXAM UNDER ANESTHESIA WITH SETON PLACEMENT;  Surgeon: Lucretia Roers, MD;  Location: [facility];  Service: General;  Laterality: N/A;   BIOPSY  12/22/2022   Procedure: BIOPSY;  Surgeon: [name], DO;  Location: [facility];  Service: Endoscopy;;   CERVICAL CONIZATION W/BX Left 08/23/2019   Procedure: LASER ABLATION OF CERVIX;  Surgeon: Lazaro Arms, MD;  Location: [facility];  Service: Gynecology;  Laterality: Left;   COLONOSCOPY WITH PROPOFOL N/A 12/22/2022   Procedure: COLONOSCOPY WITH PROPOFOL;  Surgeon: [name], DO;  Location: [facility];  Service: Endoscopy;  Laterality: N/A;  2:00 pm   INCISION AND DRAINAGE ABSCESS Left 10/30/2015   Procedure: INCISION AND DRAINAGE PERI-RECTAL ABSCESS;  Surgeon: Franky Macho, MD;  Location: [facility];  Service: General;  Laterality: Left;   INCISION AND DRAINAGE ABSCESS N/A 11/09/2022   Procedure: INCISION AND DRAINAGE PERIRECTAL ABSCESS;  Surgeon: Lewie Chamber, DO;  Location: [facility];  Service: General;  Laterality: N/A;   INCISION AND DRAINAGE PERIRECTAL ABSCESS N/A 06/05/2022   Procedure: IRRIGATION AND DEBRIDEMENT PERIRECTAL ABSCESS; PENROSE DRAIN PLACED;  Surgeon: Lucretia Roers, MD;  Location: [facility];  Service: General;  Laterality: N/A;   LIGATION OF INTERNAL FISTULA TRACT N/A 03/05/2023   Procedure: LIGATION OF INTERNAL FISTULA TRACT;   Surgeon: Romie Levee, MD;  Location: [facility];  Service: General;  Laterality: N/A;   MULTIPLE TOOTH EXTRACTIONS  05/22/2022    Family History  Problem Relation Age of Onset   Depression Mother    Anxiety disorder Mother    Depression Sister    Hypertension Maternal Grandmother    Heart attack Maternal Great-grandfather    Social History:  reports that she has been smoking cigarettes. She has a 13 pack-year smoking history. She has never used smokeless tobacco. She reports that she does not currently use alcohol. She reports that she does not currently use drugs after having used the following drugs: Marijuana.  Allergies:  Allergies  Allergen Reactions   Gentamicin Other (See Comments)    Renal issues.   Vancomycin Other (See Comments)    Kidney failure    Medications Prior to Admission  Medication Sig Dispense Refill   clonazePAM (KLONOPIN) 0.5 MG tablet Take 1 tablet by mouth twice daily as needed for anxiety 60 tablet 3   ondansetron (ZOFRAN-ODT) 4 MG disintegrating tablet Take 1 tablet (4 mg total) by mouth every 8 (eight) hours as needed. 20 tablet 0   prochlorperazine (COMPAZINE) 10 MG tablet Take 1 tablet (10 mg total) by mouth 2 (two) times daily as needed for nausea or vomiting. 10 tablet 0   promethazine (PHENERGAN) 25 MG suppository Place 1 suppository (25 mg total) rectally every 6 (six) hours as needed for nausea or vomiting. 12  each 0   sertraline (ZOLOFT) 100 MG tablet Take 1 tablet (100 mg total) by mouth daily. 90 tablet 3   traZODone (DESYREL) 50 MG tablet Take 1 tablet (50 mg total) by mouth at bedtime as needed. for sleep 90 tablet 3    Pertinent items are noted in HPI.  Blood pressure 132/72, pulse 74, temperature 98.4 F (36.9 C), temperature source Oral, resp. rate 18, height 5\' 3"  (1.6 m), weight 82 kg, SpO2 98%. General appearance: alert, cooperative, and appears stated age Head: Normocephalic, without obvious abnormality,  atraumatic Neck: supple, symmetrical, trachea midline Lungs:  normal effort Heart: regular rate and rhythm Abdomen: soft, non-tender; bowel sounds normal; no masses,  no organomegaly   Results for orders placed or performed during the hospital encounter of 10/15/23 (from the past 24 hours)  Basic metabolic panel     Status: Abnormal   Collection Time: 10/16/23 12:05 AM  Result Value Ref Range   Sodium 135 135 - 145 mmol/L   Potassium 2.5 (LL) 3.5 - 5.1 mmol/L   Chloride 103 98 - 111 mmol/L   CO2 20 (L) 22 - 32 mmol/L   Glucose, Bld 147 (H) 70 - 99 mg/dL   BUN 7 6 - 20 mg/dL   Creatinine, Ser 4.09 0.44 - 1.00 mg/dL   Calcium 8.7 (L) 8.9 - 10.3 mg/dL   GFR, Estimated >81 >19 mL/min   Anion gap 12 5 - 15  CBC with Differential     Status: Abnormal   Collection Time: 10/16/23 12:05 AM  Result Value Ref Range   WBC 20.1 (H) 4.0 - 10.5 K/uL   RBC 3.82 (L) 3.87 - 5.11 MIL/uL   Hemoglobin 11.9 (L) 12.0 - 15.0 g/dL   HCT 14.7 (L) 82.9 - 56.2 %   MCV 87.7 80.0 - 100.0 fL   MCH 31.2 26.0 - 34.0 pg   MCHC 35.5 30.0 - 36.0 g/dL   RDW 13.0 86.5 - 78.4 %   Platelets 287 150 - 400 K/uL   nRBC 0.0 0.0 - 0.2 %   Neutrophils Relative % 83 %   Neutro Abs 16.8 (H) 1.7 - 7.7 K/uL   Lymphocytes Relative 11 %   Lymphs Abs 2.2 0.7 - 4.0 K/uL   Monocytes Relative 5 %   Monocytes Absolute 1.0 0.1 - 1.0 K/uL   Eosinophils Relative 0 %   Eosinophils Absolute 0.0 0.0 - 0.5 K/uL   Basophils Relative 0 %   Basophils Absolute 0.0 0.0 - 0.1 K/uL   Immature Granulocytes 1 %   Abs Immature Granulocytes 0.13 (H) 0.00 - 0.07 K/uL  Magnesium     Status: None   Collection Time: 10/16/23 12:05 AM  Result Value Ref Range   Magnesium 1.8 1.7 - 2.4 mg/dL  Resp panel by RT-PCR (RSV, Flu A&B, Covid) Anterior Nasal Swab     Status: None   Collection Time: 10/16/23 12:14 AM   Specimen: Anterior Nasal Swab  Result Value Ref Range   SARS Coronavirus 2 by RT PCR NEGATIVE NEGATIVE   Influenza A by PCR NEGATIVE  NEGATIVE   Influenza B by PCR NEGATIVE NEGATIVE   Resp Syncytial Virus by PCR NEGATIVE NEGATIVE  CBC     Status: Abnormal   Collection Time: 10/16/23  5:33 AM  Result Value Ref Range   WBC 15.6 (H) 4.0 - 10.5 K/uL   RBC 3.91 3.87 - 5.11 MIL/uL   Hemoglobin 12.1 12.0 - 15.0 g/dL   HCT 69.6 (L) 29.5 -  46.0 %   MCV 86.7 80.0 - 100.0 fL   MCH 30.9 26.0 - 34.0 pg   MCHC 35.7 30.0 - 36.0 g/dL   RDW 16.1 09.6 - 04.5 %   Platelets 273 150 - 400 K/uL   nRBC 0.0 0.0 - 0.2 %  Creatinine, serum     Status: None   Collection Time: 10/16/23  5:33 AM  Result Value Ref Range   Creatinine, Ser 0.48 0.44 - 1.00 mg/dL   GFR, Estimated >40 >98 mL/min  TSH     Status: None   Collection Time: 10/16/23  5:33 AM  Result Value Ref Range   TSH 0.724 0.350 - 4.500 uIU/mL  T4, free     Status: None   Collection Time: 10/16/23  5:33 AM  Result Value Ref Range   Free T4 1.07 0.61 - 1.12 ng/dL   US OB Comp Less 14 Wks Result Date: 09/29/2023 Table formatting from the original result was not included. Images from the original result were not included.  ..an [employer] Technical sales engineer) accredited practice Center for [facility] [address] [place] [address] Ordering Provider: Myna Hidalgo, DO             DATING AND VIABILITY SONOGRAM CHRISELDA LEPPERT is a 33 y.o. year old G0P0000 with unknown LMP. She has irregular menstrual cycles.   She is here today for a confirmatory initial sonogram. GESTATION: SINGLETON   FETAL ACTIVITY:          Heart rate         168 bpm          The fetus is active. PLACENTA LOCALIZATION:  anterior GRADE 0 CERVIX: Appears closed ADNEXA: The ovaries are normal. GESTATIONAL AGE AND  BIOMETRICS: Gestational criteria: Estimated Date of Delivery: None noted. UnknownLMP Previous Scans: 0    CROWN RUMP LENGTH           37.1 mm         10+4 weeks                                                                      AVERAGE EGA(BY THIS SCAN):   10+4 weeks WORKING EDD( early ultrasound ):  04-22-24  TECHNICIAN COMMENTS: [place]:    unknown LMP,  check dates TA images performed Anteverted uterus with single active viable early IUP.  GS intact within mid upper cavity CRL = 37.1 mm  FHR = 168 bpm  [redacted]w[redacted]d   anterior pl, gr0 Normal ovaries - neg adnexal regions - neg CDS - no free fluid present EDC by todays u/s = 04-22-24 A copy of this report including all images has been saved and backed up to a second source for retrieval if needed. All measures and details of the anatomical scan, placentation, fluid volume and pelvic anatomy are contained in that report. [name] 09/29/2023 12:06 PM Clinical Impression and recommendations: I have reviewed the sonogram results above, combined with the patient's current clinical course, below are my impressions and any appropriate recommendations for management based on the sonographic findings. Viable early IUP G1P0000 Estimated Date of Delivery: 04/22/24 early ultrasound Normal general sonographic findings Recommend routine care unless otherwise clinically  indicated Sharon Seller    Assessment/Plan Active Problems:   Hypokalemia   Hyperemesis   [redacted] weeks gestation of pregnancy  IVF hydration K+ repletion and trend (PO and IV) NPO x 12-24 hours Scopolamine patch Zofran scheduled Reglan scheduled Compazine prn   [name] 10/16/2023, 7:49 AM

## 2023-10-17 DIAGNOSIS — Z3A13 13 weeks gestation of pregnancy: Secondary | ICD-10-CM

## 2023-10-17 DIAGNOSIS — R1110 Vomiting, unspecified: Secondary | ICD-10-CM

## 2023-10-17 DIAGNOSIS — E876 Hypokalemia: Secondary | ICD-10-CM

## 2023-10-17 LAB — BASIC METABOLIC PANEL
Anion gap: 11 (ref 5–15)
BUN: 6 mg/dL (ref 6–20)
CO2: 18 mmol/L — ABNORMAL LOW (ref 22–32)
Calcium: 8.6 mg/dL — ABNORMAL LOW (ref 8.9–10.3)
Chloride: 106 mmol/L (ref 98–111)
Creatinine, Ser: 0.52 mg/dL (ref 0.44–1.00)
GFR, Estimated: >60 mL/min (ref >60)
Glucose, Bld: 92 mg/dL (ref 70–99)
Potassium: 3 mmol/L — ABNORMAL LOW (ref 3.5–5.1)
Sodium: 135 mmol/L (ref 135–145)

## 2023-10-17 LAB — MAGNESIUM: Magnesium: 1.9 mg/dL (ref 1.7–2.4)

## 2023-10-17 MED ORDER — SCOPOLAMINE 1 MG/3DAYS TD PT72: 1.0000 | MEDICATED_PATCH | TRANSDERMAL | 0 refills | Status: DC

## 2023-10-17 MED ORDER — POTASSIUM CHLORIDE CRYS ER 20 MEQ PO TBCR: 40.0000 meq | EXTENDED_RELEASE_TABLET | Freq: Every day | ORAL | 1 refills | Status: DC

## 2023-10-17 MED ORDER — METOCLOPRAMIDE HCL 10 MG PO TABS: 10.0000 mg | ORAL_TABLET | Freq: Four times a day (QID) | ORAL | 2 refills | Status: DC | PRN

## 2023-10-17 MED ORDER — POTASSIUM CHLORIDE 20 MEQ PO PACK
40.0000 meq | PACK | Freq: Two times a day (BID) | ORAL | Status: DC
  Administered 2023-10-17: 40 meq via ORAL
  Filled 2023-10-17 (×2): qty 2

## 2023-10-17 NOTE — Progress Notes (Signed)
Patient ID: Rachel Sellers, female   DOB: 09-11-1991, 33 y.o.   MRN: [id number]   Assessment/Plan: Active Problems:   Hypokalemia   Hyperemesis   [redacted] weeks gestation of pregnancy  No emesis x 24 hours. K+ is improving. Continue to replete. Advance diet from clears to regular. If doing well, can consider DC later today vs. Tomorrow.  Subjective: Interval History:diet advanced to sips of clears. No further emesis.  Objective: Vital signs in last 24 hours: Temp:  [98.1 F (36.7 C)-98.9 F (37.2 C)] 98.1 F (36.7 C) (02/02 0027) Pulse Rate:  [60-86] 60 (02/02 0027) Resp:  [16-18] 18 (02/02 0027) BP: (114-146)/(63-83) 131/65 (02/02 0027) SpO2:  [98 %-100 %] 100 % (02/02 0027)  Intake/Output from previous day: 02/01 0701 - 02/02 0700 In: 2800.8 [P.O.:240; I.V.:2361.7] Out: 2700 [Urine:2700] Intake/Output this shift: No intake/output data recorded.  General appearance: alert, cooperative, and appears stated age Head: Normocephalic, without obvious abnormality, atraumatic Lungs: normal effort Heart: regular rate and rhythm Abdomen: soft, non-tender; bowel sounds normal; no masses,  no organomegaly  Results for orders placed or performed during the hospital encounter of 10/15/23 (from the past 24 hours)  Basic metabolic panel     Status: Abnormal   Collection Time: 10/17/23  4:11 AM  Result Value Ref Range   Sodium 135 135 - 145 mmol/L   Potassium 3.0 (L) 3.5 - 5.1 mmol/L   Chloride 106 98 - 111 mmol/L   CO2 18 (L) 22 - 32 mmol/L   Glucose, Bld 92 70 - 99 mg/dL   BUN 6 6 - 20 mg/dL   Creatinine, Ser 4.09 0.44 - 1.00 mg/dL   Calcium 8.6 (L) 8.9 - 10.3 mg/dL   GFR, Estimated >81 >19 mL/min   Anion gap 11 5 - 15  Magnesium     Status: None   Collection Time: 10/17/23  4:11 AM  Result Value Ref Range   Magnesium 1.9 1.7 - 2.4 mg/dL    Studies/Results: US OB Comp Less 14 Wks Result Date: 09/29/2023 Table formatting from the original result was not included. Images from  the original result were not included.  ..an [employer] Medicine Technical sales engineer) accredited practice Center for [facility] [address] [place] [address] Ordering Provider: Myna Hidalgo, DO             DATING AND VIABILITY SONOGRAM Rachel Sellers is a 33 y.o. year old G0P0000 with unknown LMP. She has irregular menstrual cycles.   She is here today for a confirmatory initial sonogram. GESTATION: SINGLETON   FETAL ACTIVITY:          Heart rate         168 bpm          The fetus is active. PLACENTA LOCALIZATION:  anterior GRADE 0 CERVIX: Appears closed ADNEXA: The ovaries are normal. GESTATIONAL AGE AND  BIOMETRICS: Gestational criteria: Estimated Date of Delivery: None noted. UnknownLMP Previous Scans: 0    CROWN RUMP LENGTH           37.1 mm         10+4 weeks  AVERAGE EGA(BY THIS SCAN):  10+4 weeks WORKING EDD( early ultrasound ):  04-22-24  TECHNICIAN COMMENTS: [place]:    unknown LMP,  check dates TA images performed Anteverted uterus with single active viable early IUP.  GS intact within mid upper cavity CRL = 37.1 mm  FHR = 168 bpm  [redacted]w[redacted]d   anterior pl, gr0 Normal ovaries - neg adnexal regions - neg CDS - no free fluid present EDC by todays u/s = 04-22-24 A copy of this report including all images has been saved and backed up to a second source for retrieval if needed. All measures and details of the anatomical scan, placentation, fluid volume and pelvic anatomy are contained in that report. Rachel Sellers 09/29/2023 12:06 PM Clinical Impression and recommendations: I have reviewed the sonogram results above, combined with the patient's current clinical course, below are my impressions and any appropriate recommendations for management based on the sonographic findings. Viable early IUP G1P0000 Estimated Date of Delivery: 04/22/24 early ultrasound Normal general sonographic findings Recommend routine care  unless otherwise clinically indicated Rachel Sellers    Scheduled Meds:  docusate sodium  100 mg Oral Daily   enoxaparin (LOVENOX) injection  40 mg Subcutaneous Q24H   metoCLOPramide (REGLAN) injection  10 mg Intravenous Q6H   ondansetron (ZOFRAN) IV  4 mg Intravenous Q6H   potassium chloride  40 mEq Oral BID   prenatal multivitamin  1 tablet Oral Q1200   scopolamine  1 patch Transdermal Q72H   sertraline  100 mg Oral Daily   Continuous Infusions: PRN Meds:acetaminophen, calcium carbonate, prochlorperazine, traZODone    LOS: 1 day   Rachel Bores, MD 10/17/2023 7:50 AM

## 2023-10-17 NOTE — Discharge Summary (Signed)
Physician Discharge Summary  Patient ID: Rachel Sellers MRN: [id number] DOB/AGE: May 15, 1991 33 y.o.  Admit date: 10/15/2023 Discharge date: 10/17/2023  Admission Diagnoses:Hypokalemia   Hyperemesis   [redacted] weeks gestation of pregnancy  Discharge Diagnoses: Hypokalemia   Hyperemesis   [redacted] weeks gestation of pregnancy  Active Problems:   Hypokalemia   Hyperemesis   [redacted] weeks gestation of pregnancy   Discharged Condition: good  Hospital Course: G1 with N/V on Zofran and phenergan. 3 visits to [facility] ED in 24-48 hours. Severe hypokalemia, unable to correct. Still with N/V.  G1P0000 [redacted]w[redacted]d She was admitted for hydration, antiemetic therapy and electrolyte replacement. She tolerated PO without vomiting on 10/17/23 and requested discharge Consults: None  Significant Diagnostic Studies: labs: CBC    Component Value Date/Time   WBC 15.6 (H) 10/16/2023 0533   RBC 3.91 10/16/2023 0533   HGB 12.1 10/16/2023 0533   HCT 33.9 (L) 10/16/2023 0533   PLT 273 10/16/2023 0533   MCV 86.7 10/16/2023 0533   MCH 30.9 10/16/2023 0533   MCHC 35.7 10/16/2023 0533   RDW 13.2 10/16/2023 0533   LYMPHSABS 2.2 10/16/2023 0005   MONOABS 1.0 10/16/2023 0005   EOSABS 0.0 10/16/2023 0005   BASOSABS 0.0 10/16/2023 0005      Latest Ref Rng & Units 10/17/2023    4:11 AM 10/16/2023    5:33 AM 10/16/2023   12:05 AM  CMP  Glucose 70 - 99 mg/dL 92  409  811   BUN 6 - 20 mg/dL 6  5  7    Creatinine 0.44 - 1.00 mg/dL 9.14  7.82    9.56  2.13   Sodium 135 - 145 mmol/L 135  136  135   Potassium 3.5 - 5.1 mmol/L 3.0  3.1  2.5   Chloride 98 - 111 mmol/L 106  103  103   CO2 22 - 32 mmol/L 18  20  20    Calcium 8.9 - 10.3 mg/dL 8.6  8.8  8.7      Treatments: IV hydration and antiemetic  Discharge Exam: Blood pressure 133/82, pulse 66, temperature 98 F (36.7 C), temperature source Oral, resp. rate 18, height 5\' 3"  (1.6 m), weight 82 kg, SpO2 99%. General appearance: alert, cooperative, and no distress Resp: normal  effort Cardio: regular rate and rhythm  Disposition: Discharge disposition: 01-Home or Self Care       Discharge Instructions     Discharge patient   Complete by: As directed    Discharge disposition: 01-Home or Self Care   Discharge patient date: 10/17/2023      Allergies as of 10/17/2023       Reactions   Gentamicin Other (See Comments)   Renal issues.   Vancomycin Other (See Comments)   Kidney failure        Medication List     TAKE these medications    clonazePAM 0.5 MG tablet Commonly known as: KLONOPIN Take 1 tablet by mouth twice daily as needed for anxiety   metoCLOPramide 10 MG tablet Commonly known as: REGLAN Take 1 tablet (10 mg total) by mouth 4 (four) times daily as needed for nausea or vomiting.   ondansetron 4 MG disintegrating tablet Commonly known as: ZOFRAN-ODT Take 1 tablet (4 mg total) by mouth every 8 (eight) hours as needed.   potassium chloride SA 20 MEQ tablet Commonly known as: KLOR-CON M Take 2 tablets (40 mEq total) by mouth daily.   prochlorperazine 10 MG tablet Commonly known as: COMPAZINE Take 1  tablet (10 mg total) by mouth 2 (two) times daily as needed for nausea or vomiting.   promethazine 25 MG suppository Commonly known as: PHENERGAN Place 1 suppository (25 mg total) rectally every 6 (six) hours as needed for nausea or vomiting.   scopolamine 1 MG/3DAYS Commonly known as: TRANSDERM-SCOP Place 1 patch (1.5 mg total) onto the skin every 3 (three) days. Start taking on: October 19, 2023   sertraline 100 MG tablet Commonly known as: ZOLOFT Take 1 tablet (100 mg total) by mouth daily.   traZODone 50 MG tablet Commonly known as: DESYREL Take 1 tablet (50 mg total) by mouth at bedtime as needed. for sleep        Follow-up Information     [facility] Follow up in 1 day(s).   Specialty: Obstetrics and Gynecology Contact information: [address] [place] [address] [phone]                Signed: Scheryl Darter 10/17/2023, 2:57 PM

## 2023-10-17 NOTE — Plan of Care (Signed)
  Problem: Nutrition: Goal: Adequate nutrition will be maintained Outcome: Progressing   Problem: Education: Goal: Knowledge of disease or condition will improve Outcome: Progressing Goal: Knowledge of the prescribed therapeutic regimen will improve Outcome: Progressing Goal: Individualized Educational Video(s) Outcome: Progressing   Problem: Clinical Measurements: Goal: Complications related to the disease process, condition or treatment will be avoided or minimized Outcome: Progressing   Problem: Education: Goal: Knowledge of disease or condition will improve Outcome: Progressing Goal: Knowledge of the prescribed therapeutic regimen will improve Outcome: Progressing   Problem: Bowel/Gastric: Goal: Occurences of nausea and/or vomiting will decrease Outcome: Progressing   Problem: Fluid Volume: Goal: Maintenance of adequate hydration will improve Outcome: Progressing   Problem: Nutritional: Goal: Achievement of adequate weight for body size and type will improve Outcome: Progressing

## 2023-10-18 ENCOUNTER — Ambulatory Visit (INDEPENDENT_AMBULATORY_CARE_PROVIDER_SITE_OTHER): Payer: BC Managed Care – PPO

## 2023-10-18 ENCOUNTER — Encounter: Payer: BC Managed Care – PPO | Admitting: *Deleted

## 2023-10-18 ENCOUNTER — Encounter: Payer: Self-pay | Admitting: Women's Health

## 2023-10-18 ENCOUNTER — Ambulatory Visit (INDEPENDENT_AMBULATORY_CARE_PROVIDER_SITE_OTHER): Payer: BC Managed Care – PPO | Admitting: Women's Health

## 2023-10-18 VITALS — BP 117/75 | HR 77 | Wt 171.0 lb

## 2023-10-18 DIAGNOSIS — Z3A13 13 weeks gestation of pregnancy: Secondary | ICD-10-CM | POA: Diagnosis not present

## 2023-10-18 DIAGNOSIS — Z3481 Encounter for supervision of other normal pregnancy, first trimester: Secondary | ICD-10-CM

## 2023-10-18 DIAGNOSIS — F32A Depression, unspecified: Secondary | ICD-10-CM

## 2023-10-18 DIAGNOSIS — R1110 Vomiting, unspecified: Secondary | ICD-10-CM

## 2023-10-18 DIAGNOSIS — E876 Hypokalemia: Secondary | ICD-10-CM

## 2023-10-18 DIAGNOSIS — R87619 Unspecified abnormal cytological findings in specimens from cervix uteri: Secondary | ICD-10-CM | POA: Insufficient documentation

## 2023-10-18 DIAGNOSIS — Z3402 Encounter for supervision of normal first pregnancy, second trimester: Secondary | ICD-10-CM

## 2023-10-18 DIAGNOSIS — F17200 Nicotine dependence, unspecified, uncomplicated: Secondary | ICD-10-CM

## 2023-10-18 DIAGNOSIS — F419 Anxiety disorder, unspecified: Secondary | ICD-10-CM

## 2023-10-18 DIAGNOSIS — Z113 Encounter for screening for infections with a predominantly sexual mode of transmission: Secondary | ICD-10-CM | POA: Diagnosis not present

## 2023-10-18 DIAGNOSIS — O210 Mild hyperemesis gravidarum: Secondary | ICD-10-CM | POA: Diagnosis not present

## 2023-10-18 DIAGNOSIS — Z1379 Encounter for other screening for genetic and chromosomal anomalies: Secondary | ICD-10-CM

## 2023-10-18 DIAGNOSIS — Z3682 Encounter for antenatal screening for nuchal translucency: Secondary | ICD-10-CM

## 2023-10-18 DIAGNOSIS — F418 Other specified anxiety disorders: Secondary | ICD-10-CM

## 2023-10-18 DIAGNOSIS — Z124 Encounter for screening for malignant neoplasm of cervix: Secondary | ICD-10-CM

## 2023-10-18 DIAGNOSIS — Z3401 Encounter for supervision of normal first pregnancy, first trimester: Secondary | ICD-10-CM

## 2023-10-18 DIAGNOSIS — Z31430 Encounter of female for testing for genetic disease carrier status for procreative management: Secondary | ICD-10-CM

## 2023-10-18 LAB — T3, FREE: T3, Free: 3.6 pg/mL (ref 2.0–4.4)

## 2023-10-18 MED ORDER — ASPIRIN 81 MG PO TBEC: 81.0000 mg | DELAYED_RELEASE_TABLET | Freq: Every day | ORAL | 3 refills | Status: DC

## 2023-10-18 NOTE — Progress Notes (Signed)
[place] 13+2 wks,measurements c/w dates,CRL 73.04 mm,normal ovaries,FHR 157 bpm,NB present,NT 1.5 mm

## 2023-10-18 NOTE — Patient Instructions (Signed)
[facility], thank you for choosing our office today! We appreciate the opportunity to meet your healthcare needs. You may receive a short survey by mail, e-mail, or through MyChart. If you are happy with your care we would appreciate if you could take just a few minutes to complete the survey questions. We read all of your comments and take your feedback very seriously. Thank you again for choosing our office.  [facility] at  ([address] [place], [place] [address]) Entrance C, located off of [address] Free 24/7 valet parking   Nausea & Vomiting  Have saltine crackers or pretzels by your bed and eat a few bites before you raise your head out of bed in the morning  Eat small frequent meals throughout the day instead of large meals  Drink plenty of fluids throughout the day to stay hydrated, just don't drink a lot of fluids with your meals.  This can make your stomach fill up faster making you feel sick  Do not brush your teeth right after you eat  Products with real ginger are good for nausea, like ginger ale and ginger hard candy Make sure it says made with real ginger!  Sucking on sour candy like lemon heads is also good for nausea  If your prenatal vitamins make you nauseated, take them at night so you will sleep through the nausea  Sea Bands  If you feel like you need medicine for the nausea & vomiting please let us know  If you are unable to keep any fluids or food down please let us know   Constipation  Drink plenty of fluid, preferably water, throughout the day  Eat foods high in fiber such as fruits, vegetables, and grains  Exercise, such as walking, is a good way to keep your bowels regular  Drink warm fluids, especially warm prune juice, or decaf coffee  Eat a 1/2 cup of real oatmeal (not instant), 1/2 cup applesauce, and 1/2-1 cup warm prune juice every day  If needed, you may take Colace  (docusate sodium) stool softener once or twice a day to help keep the stool soft.   If you still are having problems with constipation, you may take Miralax once daily as needed to help keep your bowels regular.   Home Blood Pressure Monitoring for Patients   Your provider has recommended that you check your blood pressure (BP) at least once a week at home. If you do not have a blood pressure cuff at home, one will be provided for you. Contact your provider if you have not received your monitor within 1 week.   Helpful Tips for Accurate Home Blood Pressure Checks  . Don't smoke, exercise, or drink caffeine 30 minutes before checking your BP . Use the restroom before checking your BP (a full bladder can raise your pressure) . Relax in a comfortable upright chair . Feet on the ground . Left arm resting comfortably on a flat surface at the level of your heart . Legs uncrossed . Back supported . Sit quietly and don't talk . Place the cuff on your bare arm . Adjust snuggly, so that only two fingertips can fit between your skin and the top of the cuff . Check 2 readings separated by at least one minute . Keep a log of your BP readings . For a visual, please reference this diagram: http://ccnc.care/bpdiagram  Provider Name: [facility]       Phone: [phone]  Zone 1: ALL CLEAR  Continue to monitor your symptoms:  . BP reading is less than 140 (top number) or less than 90 (bottom number)  . No right upper stomach pain . No headaches or seeing spots . No feeling nauseated or throwing up . No swelling in face and hands  Zone 2: CAUTION Call your doctor's office for any of the following:  . BP reading is greater than 140 (top number) or greater than 90 (bottom number)  . Stomach pain under your ribs in the middle or right side . Headaches or seeing spots . Feeling nauseated or throwing up . Swelling in face and hands  Zone 3: EMERGENCY  Seek immediate medical care if you have  any of the following:  . BP reading is greater than160 (top number) or greater than 110 (bottom number) . Severe headaches not improving with Tylenol . Serious difficulty catching your breath . Any worsening symptoms from Zone 2    First Trimester of Pregnancy The first trimester of pregnancy is from week 1 until the end of week 12 (months 1 through 3). A week after a sperm fertilizes an egg, the egg will implant on the wall of the uterus. This embryo will begin to develop into a baby. Genes from you and your partner are forming the baby. The female genes determine whether the baby is a boy or a girl. At 6-8 weeks, the eyes and face are formed, and the heartbeat can be seen on ultrasound. At the end of 12 weeks, all the baby's organs are formed.  Now that you are pregnant, you will want to do everything you can to have a healthy baby. Two of the most important things are to get good prenatal care and to follow your health care provider's instructions. Prenatal care is all the medical care you receive before the baby's birth. This care will help prevent, find, and treat any problems during the pregnancy and childbirth. BODY CHANGES Your body goes through many changes during pregnancy. The changes vary from woman to woman.   You may gain or lose a couple of pounds at first.  You may feel sick to your stomach (nauseous) and throw up (vomit). If the vomiting is uncontrollable, call your health care provider.  You may tire easily.  You may develop headaches that can be relieved by medicines approved by your health care provider.  You may urinate more often. Painful urination may mean you have a bladder infection.  You may develop heartburn as a result of your pregnancy.  You may develop constipation because certain hormones are causing the muscles that push waste through your intestines to slow down.  You may develop hemorrhoids or swollen, bulging veins (varicose veins).  Your breasts may begin  to grow larger and become tender. Your nipples may stick out more, and the tissue that surrounds them (areola) may become darker.  Your gums may bleed and may be sensitive to brushing and flossing.  Dark spots or blotches (chloasma, mask of pregnancy) may develop on your face. This will likely fade after the baby is born.  Your menstrual periods will stop.  You may have a loss of appetite.  You may develop cravings for certain kinds of food.  You may have changes in your emotions from day to day, such as being excited to be pregnant or being concerned that something may go wrong with the pregnancy and baby.  You may have more vivid and strange   dreams.  You may have changes in your hair. These can include thickening of your hair, rapid growth, and changes in texture. Some women also have hair loss during or after pregnancy, or hair that feels dry or thin. Your hair will most likely return to normal after your baby is born. WHAT TO EXPECT AT YOUR PRENATAL VISITS During a routine prenatal visit:  You will be weighed to make sure you and the baby are growing normally.  Your blood pressure will be taken.  Your abdomen will be measured to track your baby's growth.  The fetal heartbeat will be listened to starting around week 10 or 12 of your pregnancy.  Test results from any previous visits will be discussed. Your health care provider may ask you:  How you are feeling.  If you are feeling the baby move.  If you have had any abnormal symptoms, such as leaking fluid, bleeding, severe headaches, or abdominal cramping.  If you have any questions. Other tests that may be performed during your first trimester include:  Blood tests to find your blood type and to check for the presence of any previous infections. They will also be used to check for low iron levels (anemia) and Rh antibodies. Later in the pregnancy, blood tests for diabetes will be done along with other tests if problems  develop.  Urine tests to check for infections, diabetes, or protein in the urine.  An ultrasound to confirm the proper growth and development of the baby.  An amniocentesis to check for possible genetic problems.  Fetal screens for spina bifida and Down syndrome.  You may need other tests to make sure you and the baby are doing well. HOME CARE INSTRUCTIONS  Medicines  Follow your health care provider's instructions regarding medicine use. Specific medicines may be either safe or unsafe to take during pregnancy.  Take your prenatal vitamins as directed.  If you develop constipation, try taking a stool softener if your health care provider approves. Diet  Eat regular, well-balanced meals. Choose a variety of foods, such as meat or vegetable-based protein, fish, milk and low-fat dairy products, vegetables, fruits, and whole grain breads and cereals. Your health care provider will help you determine the amount of weight gain that is right for you.  Avoid raw meat and uncooked cheese. These carry germs that can cause birth defects in the baby.  Eating four or five small meals rather than three large meals a day may help relieve nausea and vomiting. If you start to feel nauseous, eating a few soda crackers can be helpful. Drinking liquids between meals instead of during meals also seems to help nausea and vomiting.  If you develop constipation, eat more high-fiber foods, such as fresh vegetables or fruit and whole grains. Drink enough fluids to keep your urine clear or pale yellow. Activity and Exercise  Exercise only as directed by your health care provider. Exercising will help you:  Control your weight.  Stay in shape.  Be prepared for labor and delivery.  Experiencing pain or cramping in the lower abdomen or low back is a good sign that you should stop exercising. Check with your health care provider before continuing normal exercises.  Try to avoid standing for long periods of  time. Move your legs often if you must stand in one place for a long time.  Avoid heavy lifting.  Wear low-heeled shoes, and practice good posture.  You may continue to have sex unless your health care provider directs   you otherwise. Relief of Pain or Discomfort  Wear a good support bra for breast tenderness.    Take warm sitz baths to soothe any pain or discomfort caused by hemorrhoids. Use hemorrhoid cream if your health care provider approves.    Rest with your legs elevated if you have leg cramps or low back pain.  If you develop varicose veins in your legs, wear support hose. Elevate your feet for 15 minutes, 3-4 times a day. Limit salt in your diet. Prenatal Care  Schedule your prenatal visits by the twelfth week of pregnancy. They are usually scheduled monthly at first, then more often in the last 2 months before delivery.  Write down your questions. Take them to your prenatal visits.  Keep all your prenatal visits as directed by your health care provider. Safety  Wear your seat belt at all times when driving.  Make a list of emergency phone numbers, including numbers for family, friends, the hospital, and police and fire departments. General Tips  Ask your health care provider for a referral to a local prenatal education class. Begin classes no later than at the beginning of month 6 of your pregnancy.  Ask for help if you have counseling or nutritional needs during pregnancy. Your health care provider can offer advice or refer you to specialists for help with various needs.  Do not use hot tubs, steam rooms, or saunas.  Do not douche or use tampons or scented sanitary pads.  Do not cross your legs for long periods of time.  Avoid cat litter boxes and soil used by cats. These carry germs that can cause birth defects in the baby and possibly loss of the fetus by miscarriage or stillbirth.  Avoid all smoking, herbs, alcohol, and medicines not prescribed by your health  care provider. Chemicals in these affect the formation and growth of the baby.  Schedule a dentist appointment. At home, brush your teeth with a soft toothbrush and be gentle when you floss. SEEK MEDICAL CARE IF:   You have dizziness.  You have mild pelvic cramps, pelvic pressure, or nagging pain in the abdominal area.  You have persistent nausea, vomiting, or diarrhea.  You have a bad smelling vaginal discharge.  You have pain with urination.  You notice increased swelling in your face, hands, legs, or ankles. SEEK IMMEDIATE MEDICAL CARE IF:   You have a fever.  You are leaking fluid from your vagina.  You have spotting or bleeding from your vagina.  You have severe abdominal cramping or pain.  You have rapid weight gain or loss.  You vomit blood or material that looks like coffee grounds.  You are exposed to German measles and have never had them.  You are exposed to fifth disease or chickenpox.  You develop a severe headache.  You have shortness of breath.  You have any kind of trauma, such as from a fall or a car accident. Document Released: 08/25/2001 Document Revised: 01/15/2014 Document Reviewed: 07/11/2013 ExitCare Patient Information 2015 [employer]. This information is not intended to replace advice given to you by your health care provider. Make sure you discuss any questions you have with your health care provider.  

## 2023-10-18 NOTE — Progress Notes (Signed)
INITIAL OBSTETRICAL VISIT Patient name: Rachel Sellers MRN [id number]  Date of birth: 15-Aug-1991 Chief Complaint:   Initial Prenatal Visit  History of Present Illness:   Rachel Sellers is a 33 y.o. G47P0000 Caucasian female at [redacted]w[redacted]d by [place] at 10 weeks with an Estimated Date of Delivery: 04/22/24 being seen today for her initial obstetrical visit.   No LMP recorded (lmp unknown). Patient is pregnant. Her obstetrical history is significant for primigravida.  Has been trying to get pregnant for 4 years.D/c'd from [facility] yesterday after 1d stay for HG w/ hypokalemia, K+was 2.5, was 3.0 yesterday. Hasn't picked up rx for po K+ yet, or reglan/scop patch. Has been able to keep down gatorade, crackers and stew today, hasn't had to take any nausea meds today, but doesn't feel great right now, wants to postpone pap til next visit.  Dep/anx- on zoloft 100mg , was on klonopin and trazadone prior to pregnancy- quit both w/ +PT, doing ok off them, ok w/ [facility] referral.  Smoker: 1ppd   Last pap 10/09/22. Results were: NILM w/ HRHPV positive: type not specified     10/18/2023    2:21 PM 01/04/2023   10:43 AM 11/13/2022    4:17 PM 10/09/2022    1:20 PM 09/18/2022    2:32 PM  Depression screen PHQ 2/9  Decreased Interest 1 1 1 2 1   Down, Depressed, Hopeless 0 0 2 1 1   PHQ - 2 Score 1 1 3 3 2   Altered sleeping 3  2 1 3   Tired, decreased energy 3  2 1 2   Change in appetite 3  3 1 3   Feeling bad or failure about yourself  0  1 1 0  Trouble concentrating 0  3 3 1   Moving slowly or fidgety/restless 0  3 3 3   Suicidal thoughts 0  0 0 0  PHQ-9 Score 10  17 13 14   Difficult doing work/chores   Somewhat difficult Very difficult Extremely dIfficult        10/18/2023    2:22 PM 11/13/2022    4:17 PM 10/09/2022    1:21 PM 09/18/2022    2:33 PM  GAD 7 : Generalized Anxiety Score  Nervous, Anxious, on Edge 1 3 3 3   Control/stop worrying 1 3 3 3   Worry too much - different things 1 3 3 3   Trouble relaxing 1 3 3 3    Restless 1 3 3 3   Easily annoyed or irritable 1 3 3 3   Afraid - awful might happen 1 3 3 3   Total GAD 7 Score 7 21 21 21   Anxiety Difficulty  Extremely difficult Extremely difficult Extremely difficult     Review of Systems:   Pertinent items are noted in HPI Denies cramping/contractions, leakage of fluid, vaginal bleeding, abnormal vaginal discharge w/ itching/odor/irritation, headaches, visual changes, shortness of breath, chest pain, abdominal pain, severe nausea/vomiting, or problems with urination or bowel movements unless otherwise stated above.  Pertinent History Reviewed:  Reviewed past medical,surgical, social, obstetrical and family history.  Reviewed problem list, medications and allergies. OB History  Gravida Para Term Preterm AB Living  1 0 0 0 0 0  SAB IAB Ectopic Multiple Live Births  0 0 0 0 0    # Outcome Date GA Lbr Len/2nd Weight Sex Type Anes PTL Lv  1 Current            Physical Assessment:   Vitals:   10/18/23 1427  BP: 117/75  Pulse: 77  Weight: 171 lb (77.6 kg)  Body mass index is 30.29 kg/m.       Physical Examination:  General appearance - well appearing, and in no distress  Mental status - alert, oriented to person, place, and time  Psych:  She has a normal mood and affect  Skin - warm and dry, normal color, no suspicious lesions noted  Chest - effort normal, all lung fields clear to auscultation bilaterally  Heart - normal rate and regular rhythm  Abdomen - soft, nontender  Extremities:  No swelling or varicosities noted  Thin prep pap is not done> pt doesn't feel well today, wants to do next visit  Chaperone: N/A    TODAY'S NT [place] 13+2 wks,measurements c/w dates,CRL 73.04 mm,normal ovaries,FHR 157 bpm,NB present,NT 1.5 mm   No results found for this or any previous visit (from the past 24 hours).  Assessment & Plan:  1) Low-Risk Pregnancy G1P0000 at [redacted]w[redacted]d with an Estimated Date of Delivery: 04/22/24   2) Initial OB visit  3) HG w/  hypokalemia> 9lb wt loss (180 prior to pregnancy), d/c'd from [facility] yesterday after 1 day admit, rx'd K+ daily, hasn't picked up yet (or reglan/scop patch), plans to get today. Per Dr. Charlotta Newton repeat K+ today, depending on result, can start to wean po K+ if eating well/no vomiting.   4) Dep/anx> doing well on zoloft 100mg , stopped klonopin and trazadone w/ +PT, ok w/ [facility] referral  5) Smoker> 1ppd prior to pregnancy> almost completely quit! None x 2d, small puff today  Meds:  Meds ordered this encounter  Medications   aspirin EC 81 MG tablet    Sig: Take 1 tablet (81 mg total) by mouth daily. Swallow whole.    Dispense:  90 tablet    Refill:  3    Initial labs obtained Continue prenatal vitamins Reviewed n/v relief measures and warning s/s to report Reviewed recommended weight gain based on pre-gravid BMI Encouraged well-balanced diet Genetic & carrier screening discussed: requests Panorama, NT/IT, and Horizon  Ultrasound discussed; fetal survey: requested [facility]NC completed> form faxed if has or is planning to apply for medicaid The nature of [employer] for [employer] with multiple MDs and other Advanced Practice Providers was explained to patient; also emphasized that fellows, residents, and students are part of our team. Does not have home bp cuff. Office bp cuff given: yes. Rx sent: no. Check bp weekly, let us know if consistently >140/90.   Indications for ASA therapy (per uptodate) OR Two or more of the following: Nulliparity Yes Obesity (BMI>30 kg/m2) Yes  Follow-up: Return in about 1 week (around 10/25/2023) for LROB, MD, in person.   Orders Placed This Encounter  Procedures   Urine Culture   CBC/D/Plt+RPR+Rh+ABO+RubIgG...   HgB A1c   Integrated 1   PANORAMA PRENATAL TEST   HORIZON CUSTOM   Comprehensive metabolic panel   Amb ref to [facility]    Cheral Marker CNM, [facility] 10/18/2023 3:44 PM

## 2023-10-19 ENCOUNTER — Encounter: Payer: Self-pay | Admitting: Women's Health

## 2023-10-19 LAB — COMPREHENSIVE METABOLIC PANEL
ALT: 50 [IU]/L — ABNORMAL HIGH (ref 0–32)
AST: 29 [IU]/L (ref 0–40)
Albumin: 4 g/dL (ref 3.9–4.9)
Alkaline Phosphatase: 55 [IU]/L (ref 44–121)
BUN/Creatinine Ratio: 12 (ref 9–23)
BUN: 7 mg/dL (ref 6–20)
Bilirubin Total: 0.4 mg/dL (ref 0.0–1.2)
CO2: 19 mmol/L — ABNORMAL LOW (ref 20–29)
Calcium: 9.2 mg/dL (ref 8.7–10.2)
Chloride: 101 mmol/L (ref 96–106)
Creatinine, Ser: 0.57 mg/dL (ref 0.57–1.00)
Globulin, Total: 2.4 g/dL (ref 1.5–4.5)
Glucose: 88 mg/dL (ref 70–99)
Potassium: 3.7 mmol/L (ref 3.5–5.2)
Sodium: 137 mmol/L (ref 134–144)
Total Protein: 6.4 g/dL (ref 6.0–8.5)
eGFR: 123 mL/min/{1.73_m2} (ref >59)

## 2023-10-20 ENCOUNTER — Encounter (HOSPITAL_COMMUNITY): Payer: Self-pay | Admitting: Obstetrics and Gynecology

## 2023-10-20 ENCOUNTER — Inpatient Hospital Stay (HOSPITAL_COMMUNITY)
Admission: AD | Admit: 2023-10-20 | Discharge: 2023-10-20 | Disposition: A | Discharge status: Home / Self Care | Payer: BC Managed Care – PPO | Attending: Obstetrics and Gynecology | Admitting: Obstetrics and Gynecology

## 2023-10-20 DIAGNOSIS — O26891 Other specified pregnancy related conditions, first trimester: Secondary | ICD-10-CM | POA: Insufficient documentation

## 2023-10-20 DIAGNOSIS — F17210 Nicotine dependence, cigarettes, uncomplicated: Secondary | ICD-10-CM | POA: Insufficient documentation

## 2023-10-20 DIAGNOSIS — O99331 Smoking (tobacco) complicating pregnancy, first trimester: Secondary | ICD-10-CM | POA: Diagnosis not present

## 2023-10-20 DIAGNOSIS — O211 Hyperemesis gravidarum with metabolic disturbance: Secondary | ICD-10-CM

## 2023-10-20 DIAGNOSIS — Z3A13 13 weeks gestation of pregnancy: Secondary | ICD-10-CM | POA: Diagnosis not present

## 2023-10-20 DIAGNOSIS — O26892 Other specified pregnancy related conditions, second trimester: Secondary | ICD-10-CM | POA: Diagnosis present

## 2023-10-20 DIAGNOSIS — R112 Nausea with vomiting, unspecified: Secondary | ICD-10-CM | POA: Diagnosis not present

## 2023-10-20 LAB — URINALYSIS, ROUTINE W REFLEX MICROSCOPIC
Bacteria, UA: NONE SEEN
Bilirubin Urine: NEGATIVE
Glucose, UA: NEGATIVE mg/dL
Ketones, ur: 20 mg/dL — AB
Leukocytes,Ua: NEGATIVE
Nitrite: NEGATIVE
Protein, ur: NEGATIVE mg/dL
RBC / HPF: >50 RBC/hpf (ref 0–5)
Specific Gravity, Urine: 1.015 (ref 1.005–1.030)
pH: 6 (ref 5.0–8.0)

## 2023-10-20 MED ORDER — SODIUM CHLORIDE 0.9 % IV SOLN
8.0000 mg | Freq: Once | INTRAVENOUS | Status: AC
  Administered 2023-10-20: 8 mg via INTRAVENOUS
  Filled 2023-10-20: qty 4

## 2023-10-20 MED ORDER — CYCLOBENZAPRINE HCL 5 MG PO TABS
10.0000 mg | ORAL_TABLET | Freq: Once | ORAL | Status: AC
  Administered 2023-10-20: 10 mg via ORAL
  Filled 2023-10-20: qty 2

## 2023-10-20 MED ORDER — CYCLOBENZAPRINE HCL 10 MG PO TABS: 10.0000 mg | ORAL_TABLET | Freq: Two times a day (BID) | ORAL | 0 refills | Status: DC | PRN

## 2023-10-20 MED ORDER — SODIUM CHLORIDE 0.9 % IV SOLN
25.0000 mg | Freq: Once | INTRAVENOUS | Status: AC
Start: 2023-10-20 — End: 2023-10-20
  Administered 2023-10-20: 25 mg via INTRAVENOUS
  Filled 2023-10-20: qty 1

## 2023-10-20 MED ORDER — SCOPOLAMINE 1 MG/3DAYS TD PT72: 1.0000 | MEDICATED_PATCH | Freq: Once | TRANSDERMAL | Status: DC

## 2023-10-20 MED ORDER — FAMOTIDINE IN NACL 20-0.9 MG/50ML-% IV SOLN
20.0000 mg | Freq: Once | INTRAVENOUS | Status: AC
  Administered 2023-10-20: 20 mg via INTRAVENOUS
  Filled 2023-10-20: qty 50

## 2023-10-20 MED ORDER — LACTATED RINGERS IV SOLN
Freq: Once | INTRAVENOUS | Status: AC
  Filled 2023-10-20: qty 1000

## 2023-10-20 NOTE — MAU Provider Note (Signed)
 History     CSN: [id number]  Arrival date and time: 10/20/23 0730   Event Date/Time   First Provider Initiated Contact with Patient 10/20/23 [phone]      Chief Complaint  Patient presents with   Abdominal Pain   Nausea   HPI Rachel Sellers is a 33 y.o. year old G45P0000 female at [redacted]w[redacted]d weeks gestation who presents to MAU reporting ongoing nausea and vomiting throughout this pregnancy; unable to keep down any of her nausea medicines.  She is unable to tolerate food or fluids.  She did apply a Scopolamine  patch this morning.  She is actively vomiting in triage and in the room of MAU.  She complains of right lateral side pain; pain rated 9/10.  She describes that pain as a constant ache.  She denies vaginal bleeding or loss of fluid or abnormal vaginal discharge.  She receives prenatal care at [facility]; next appt is 10/25/2023.  OB History     Gravida  1   Para  0   Term  0   Preterm  0   AB  0   Living  0      SAB  0   IAB  0   Ectopic  0   Multiple  0   Live Births  0           Past Medical History:  Diagnosis Date   Anemia    Anxiety    Bipolar 1 disorder (HCC)    Depression    History of kidney stones    OCD (obsessive compulsive disorder)    PONV (postoperative nausea and vomiting)    Renal disorder    kidney stones   Vaginal Pap smear, abnormal     Past Surgical History:  Procedure Laterality Date   ANAL FISTULOTOMY N/A 07/31/2022   Procedure: EXAM UNDER ANESTHESIA WITH SETON PLACEMENT;  Surgeon: Kallie Manuelita BROCKS, MD;  Location: [facility];  Service: General;  Laterality: N/A;   BIOPSY  12/22/2022   Procedure: BIOPSY;  Surgeon: Cindie Carlin POUR, DO;  Location: [facility];  Service: Endoscopy;;   CERVICAL CONIZATION W/BX Left 08/23/2019   Procedure: LASER ABLATION OF CERVIX;  Surgeon: Jayne Vonn DEL, MD;  Location: [facility];  Service: Gynecology;  Laterality: Left;   COLONOSCOPY WITH PROPOFOL  N/A 12/22/2022   Procedure: COLONOSCOPY WITH  PROPOFOL ;  Surgeon: Cindie Carlin POUR, DO;  Location: [facility];  Service: Endoscopy;  Laterality: N/A;  2:00 pm   INCISION AND DRAINAGE ABSCESS Left 10/30/2015   Procedure: INCISION AND DRAINAGE PERI-RECTAL ABSCESS;  Surgeon: Oneil Budge, MD;  Location: [facility];  Service: General;  Laterality: Left;   INCISION AND DRAINAGE ABSCESS N/A 11/09/2022   Procedure: INCISION AND DRAINAGE PERIRECTAL ABSCESS;  Surgeon: Evonnie Dorothyann LABOR, DO;  Location: [facility];  Service: General;  Laterality: N/A;   INCISION AND DRAINAGE PERIRECTAL ABSCESS N/A 06/05/2022   Procedure: IRRIGATION AND DEBRIDEMENT PERIRECTAL ABSCESS; PENROSE DRAIN PLACED;  Surgeon: Kallie Manuelita BROCKS, MD;  Location: [facility];  Service: General;  Laterality: N/A;   LIGATION OF INTERNAL FISTULA TRACT N/A 03/05/2023   Procedure: LIGATION OF INTERNAL FISTULA TRACT;  Surgeon: Debby Hila, MD;  Location: [facility];  Service: General;  Laterality: N/A;   MULTIPLE TOOTH EXTRACTIONS  05/22/2022    Family History  Problem Relation Age of Onset   Depression Mother    Anxiety disorder Mother    Depression Sister    Hypertension Maternal Grandmother  Heart attack Maternal Great-grandfather     Social History   Tobacco Use   Smoking status: Every Day    Current packs/day: 1.00    Average packs/day: 1 pack/day for 13.0 years (13.0 ttl pk-yrs)    Types: Cigarettes   Smokeless tobacco: Never  Vaping Use   Vaping status: Some Days  Substance Use Topics   Alcohol use: Not Currently    Comment: occassionally   Drug use: Not Currently    Types: Marijuana    Allergies:  Allergies  Allergen Reactions   Gentamicin Other (See Comments)    Renal issues.   Vancomycin  Other (See Comments)    Kidney failure    Medications Prior to Admission  Medication Sig Dispense Refill Last Dose/Taking   scopolamine  (TRANSDERM-SCOP) 1 MG/3DAYS Place 1 patch (1.5 mg total) onto the skin every 3 (three) days. 10 patch 0 10/20/2023  Morning   aspirin  EC 81 MG tablet Take 1 tablet (81 mg total) by mouth daily. Swallow whole. 90 tablet 3    clonazePAM  (KLONOPIN ) 0.5 MG tablet Take 1 tablet by mouth twice daily as needed for anxiety (Patient not taking: Reported on 10/18/2023) 60 tablet 3    metoCLOPramide  (REGLAN ) 10 MG tablet Take 1 tablet (10 mg total) by mouth 4 (four) times daily as needed for nausea or vomiting. (Patient not taking: Reported on 10/18/2023) 30 tablet 2    ondansetron  (ZOFRAN -ODT) 4 MG disintegrating tablet Take 1 tablet (4 mg total) by mouth every 8 (eight) hours as needed. (Patient not taking: Reported on 10/18/2023) 20 tablet 0    potassium chloride  SA (KLOR-CON  M) 20 MEQ tablet Take 2 tablets (40 mEq total) by mouth daily. 30 tablet 1    prochlorperazine  (COMPAZINE ) 10 MG tablet Take 1 tablet (10 mg total) by mouth 2 (two) times daily as needed for nausea or vomiting. (Patient not taking: Reported on 10/18/2023) 10 tablet 0    promethazine  (PHENERGAN ) 25 MG suppository Place 1 suppository (25 mg total) rectally every 6 (six) hours as needed for nausea or vomiting. (Patient not taking: Reported on 10/18/2023) 12 each 0    sertraline  (ZOLOFT ) 100 MG tablet Take 1 tablet (100 mg total) by mouth daily. 90 tablet 3    traZODone  (DESYREL ) 50 MG tablet Take 1 tablet (50 mg total) by mouth at bedtime as needed. for sleep (Patient not taking: Reported on 10/18/2023) 90 tablet 3     Review of Systems  Constitutional:  Positive for fatigue and unexpected weight change.  HENT: Negative.    Eyes: Negative.   Respiratory: Negative.    Cardiovascular: Negative.   Gastrointestinal:  Positive for nausea and vomiting.  Endocrine: Negative.   Genitourinary: Negative.   Musculoskeletal: Negative.   Skin: Negative.   Allergic/Immunologic: Negative.   Neurological: Negative.   Hematological: Negative.   Psychiatric/Behavioral: Negative.     Physical Exam   Blood pressure 138/76, pulse 91, temperature 98.2 F (36.8 C),  temperature source Oral, resp. rate 20, SpO2 100%.  Physical Exam Vitals and nursing note reviewed.  Constitutional:      Appearance: Normal appearance. She is normal weight.  Cardiovascular:     Rate and Rhythm: Normal rate.  Pulmonary:     Effort: Pulmonary effort is normal.  Neurological:     Mental Status: She is alert.  Psychiatric:        Behavior: Behavior normal. Behavior is cooperative.        Thought Content: Thought content normal.  Judgment: Judgment normal.   FHTs by doppler: 151 bpm  MAU Course  Procedures  MDM CCUA Start IV IVFs: Phenergan  25 mg in LR 200 ml @ 999 ml/hr  -- resolved nausea/vomiting MVI 1 ampule in LR 1000 ml @ 999 ml/hr Zofran  8 mg IVPB  -- resolved nausea/vomiting  Pepcid  20 mg IVPB PO Challenge -- patient tolerated  Flexeril  10 mg -- relieved flank pain  Results for orders placed or performed during the hospital encounter of 10/20/23 (from the past 48 hours)  Urinalysis, Routine w reflex microscopic -Urine, Clean Catch     Status: Abnormal   Collection Time: 10/20/23  8:04 AM  Result Value Ref Range   Color, Urine YELLOW YELLOW   APPearance HAZY (A) CLEAR   Specific Gravity, Urine 1.015 1.005 - 1.030   pH 6.0 5.0 - 8.0   Glucose, UA NEGATIVE NEGATIVE mg/dL   Hgb urine dipstick LARGE (A) NEGATIVE   Bilirubin Urine NEGATIVE NEGATIVE   Ketones, ur 20 (A) NEGATIVE mg/dL   Protein, ur NEGATIVE NEGATIVE mg/dL   Nitrite NEGATIVE NEGATIVE   Leukocytes,Ua NEGATIVE NEGATIVE   RBC / HPF >50 0 - 5 RBC/hpf   WBC, UA 0-5 0 - 5 WBC/hpf   Bacteria, UA NONE SEEN NONE SEEN   Squamous Epithelial / HPF 0-5 0 - 5 /HPF   Mucus PRESENT     Comment: Performed at [facility], [address] [place], [place] [address]     Assessment and Plan  1. Hyperemesis gravidarum before end of [redacted] week gestation with dehydration (Primary) - Information provided on HG and severe morning sickness   2. [redacted] weeks gestation of pregnancy   - Discharge  patient - Keep scheduled appt with FT on 10/25/2023 - Patient verbalized an understanding of the plan of care and agrees.   Ala Cart, CNM 10/20/2023, 8:24 AM

## 2023-10-20 NOTE — MAU Note (Addendum)
.  Rachel Sellers is a 33 y.o. at [redacted]w[redacted]d here in MAU reporting: nausea and vomiting that has been on-going throughout her pregnancy - unable to keep down medications. Reports 1 episode of vomiting in the last 24 hours. Not tolerating fluids or food. Also reporting right, lateral side pain that is constant aching. Denies vaginal bleeding or LOF.  Patient actively vomiting during triage.  LMP: N/A Onset of complaint: On-going Pain score: 9/10 Vitals:   10/20/23 0743  BP: 138/79  Pulse: 87  Resp: 20  Temp: 98.2 F (36.8 C)  SpO2: 100%     FHT: 151  Lab orders placed from triage: UA

## 2023-10-22 LAB — INTEGRATED 1
Crown Rump Length: 73 mm
Gest. Age on Collection Date: 13.3 wk
Maternal Age at EDD: 34.3 a
Nuchal Translucency (NT): 1.5 mm
Number of Fetuses: 1
PAPP-A Value: 775.9 ng/mL
Sonographer ID#: 309760

## 2023-10-22 LAB — CBC/D/PLT+RPR+RH+ABO+RUBIGG...
Antibody Screen: NEGATIVE
Basophils Absolute: 0 10*3/uL (ref 0.0–0.2)
Basos: 0 %
EOS (ABSOLUTE): 0.1 10*3/uL (ref 0.0–0.4)
Eos: 1 %
HCV Ab: NONREACTIVE
HIV Screen 4th Generation wRfx: NONREACTIVE
Hematocrit: 40.4 % (ref 34.0–46.6)
Hemoglobin: 13.6 g/dL (ref 11.1–15.9)
Hepatitis B Surface Ag: NEGATIVE
Immature Grans (Abs): 0.1 10*3/uL (ref 0.0–0.1)
Immature Granulocytes: 1 %
Lymphocytes Absolute: 3.6 10*3/uL — ABNORMAL HIGH (ref 0.7–3.1)
Lymphs: 27 %
MCH: 30.8 pg (ref 26.6–33.0)
MCHC: 33.7 g/dL (ref 31.5–35.7)
MCV: 91 fL (ref 79–97)
Monocytes Absolute: 0.8 10*3/uL (ref 0.1–0.9)
Monocytes: 6 %
Neutrophils Absolute: 8.7 10*3/uL — ABNORMAL HIGH (ref 1.4–7.0)
Neutrophils: 65 %
Platelets: 332 10*3/uL (ref 150–450)
RBC: 4.42 x10E6/uL (ref 3.77–5.28)
RDW: 13.9 % (ref 11.7–15.4)
RPR Ser Ql: NONREACTIVE
Rh Factor: POSITIVE
Rubella Antibodies, IGG: 2.95 {index} (ref >0.99)
WBC: 13.4 10*3/uL — ABNORMAL HIGH (ref 3.4–10.8)

## 2023-10-22 LAB — HEMOGLOBIN A1C
Est. average glucose Bld gHb Est-mCnc: 117 mg/dL
Hgb A1c MFr Bld: 5.7 % — ABNORMAL HIGH (ref 4.8–5.6)

## 2023-10-22 LAB — HCV INTERPRETATION

## 2023-10-25 ENCOUNTER — Ambulatory Visit (INDEPENDENT_AMBULATORY_CARE_PROVIDER_SITE_OTHER): Payer: BC Managed Care – PPO | Admitting: Obstetrics & Gynecology

## 2023-10-25 ENCOUNTER — Other Ambulatory Visit: Payer: BC Managed Care – PPO

## 2023-10-25 ENCOUNTER — Encounter: Payer: Self-pay | Admitting: Women's Health

## 2023-10-25 ENCOUNTER — Telehealth: Payer: Self-pay | Admitting: Clinical

## 2023-10-25 VITALS — BP 136/87 | HR 110 | Wt 174.0 lb

## 2023-10-25 DIAGNOSIS — F17200 Nicotine dependence, unspecified, uncomplicated: Secondary | ICD-10-CM | POA: Diagnosis not present

## 2023-10-25 DIAGNOSIS — O285 Abnormal chromosomal and genetic finding on antenatal screening of mother: Secondary | ICD-10-CM

## 2023-10-25 DIAGNOSIS — Z131 Encounter for screening for diabetes mellitus: Secondary | ICD-10-CM

## 2023-10-25 DIAGNOSIS — Z3A14 14 weeks gestation of pregnancy: Secondary | ICD-10-CM

## 2023-10-25 DIAGNOSIS — R1110 Vomiting, unspecified: Secondary | ICD-10-CM | POA: Diagnosis not present

## 2023-10-25 DIAGNOSIS — Z3402 Encounter for supervision of normal first pregnancy, second trimester: Secondary | ICD-10-CM

## 2023-10-25 DIAGNOSIS — R7309 Other abnormal glucose: Secondary | ICD-10-CM

## 2023-10-25 LAB — PANORAMA PRENATAL TEST FULL PANEL:PANORAMA TEST PLUS 5 ADDITIONAL MICRODELETIONS
FETAL FRACTION: 3.9
REPORT SUMMARY: HIGH — AB
TRISOMY 13 RESULT TEXT: HIGH — AB

## 2023-10-25 NOTE — Progress Notes (Signed)
 LOW-RISK PREGNANCY VISIT Patient name: Rachel Sellers MRN [id number]  Date of birth: 1990/10/19 Chief Complaint:   Routine Prenatal Visit  History of Present Illness:   Rachel Sellers is a 33 y.o. G43P0000 female at [redacted]w[redacted]d with an Estimated Date of Delivery: 04/22/24 being seen today for ongoing management of a low-risk pregnancy.   -Hyperemesis:   Doing better with patch, zofran  prn, phenergan  at night.  Just vomiting in am and able to keep some foods down.  She knows what she can and cannot eat.  She is able to stay hydrated.  Overall she feels like things are doing much better and would prefer to avoid hospitalization if possible  Orts no other acute complaints     10/18/2023    2:21 PM 01/04/2023   10:43 AM 11/13/2022    4:17 PM 10/09/2022    1:20 PM 09/18/2022    2:32 PM  Depression screen PHQ 2/9  Decreased Interest 1 1 1 2 1   Down, Depressed, Hopeless 0 0 2 1 1   PHQ - 2 Score 1 1 3 3 2   Altered sleeping 3  2 1 3   Tired, decreased energy 3  2 1 2   Change in appetite 3  3 1 3   Feeling bad or failure about yourself  0  1 1 0  Trouble concentrating 0  3 3 1   Moving slowly or fidgety/restless 0  3 3 3   Suicidal thoughts 0  0 0 0  PHQ-9 Score 10  17 13 14   Difficult doing work/chores   Somewhat difficult Very difficult Extremely dIfficult     Contractions: Not present.  .  Movement: Absent. denies leaking of fluid. Review of Systems:   Pertinent items are noted in HPI Denies abnormal vaginal discharge w/ itching/odor/irritation, headaches, visual changes, shortness of breath, chest pain, abdominal pain or  problems with urination or bowel movements unless otherwise stated above. Pertinent History Reviewed:  Reviewed past medical,surgical, social, obstetrical and family history.  Reviewed problem list, medications and allergies.  Physical Assessment:   Vitals:   10/25/23 1032  BP: 136/87  Pulse: (!) 110  Weight: 174 lb (78.9 kg)  Body mass index is 30.82 kg/m.         Physical Examination:   General appearance: Well appearing, and in no distress  Mental status: Alert, oriented to person, place, and time  Skin: Warm & dry  Respiratory: Normal respiratory effort, no distress  Abdomen: Soft, gravid, nontender  Pelvic: Cervical exam deferred         Extremities:  no edema  Psych:  mood and affect appropriate  Fetal Status: Fetal Heart Rate (bpm): 150   Movement: Absent    Chaperone: n/a    No results found for this or any previous visit (from the past 24 hours).   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  Labs/procedures today: doppler  Plan: MFM for detailed anatomy ultrasound Next visit: prefers in person    Reviewed: Preterm labor symptoms and general  obstetric precautions including but not limited to vaginal bleeding, contractions, leaking of fluid and fetal movement were reviewed in detail with the patient.  All questions were answered.  Follow-up: Return in about 4 weeks (around 11/22/2023) for 4 wk OB visit and MFM scan.  Orders Placed This Encounter  Procedures   US  MFM OB DETAIL +14 WK    Keene Pastures, DO Attending Obstetrician & Gynecologist, [facility] [employer], [facility]

## 2023-10-25 NOTE — Telephone Encounter (Signed)
Attempt call regarding referral; Unable to leave voicemail as mailbox is full.  

## 2023-10-26 LAB — URINE CULTURE

## 2023-10-27 ENCOUNTER — Encounter: Payer: Self-pay | Admitting: Women's Health

## 2023-10-28 ENCOUNTER — Telehealth: Payer: Self-pay

## 2023-10-28 NOTE — Telephone Encounter (Signed)
I called to see about scheduling a sugar test, but was unable to leave voicemail due to voicemail being full. Please advise.

## 2023-11-02 LAB — HORIZON CUSTOM: REPORT SUMMARY: NEGATIVE

## 2023-11-03 ENCOUNTER — Encounter: Payer: Self-pay | Admitting: Women's Health

## 2023-11-03 DIAGNOSIS — O285 Abnormal chromosomal and genetic finding on antenatal screening of mother: Secondary | ICD-10-CM | POA: Insufficient documentation

## 2023-11-10 ENCOUNTER — Telehealth: Payer: Self-pay

## 2023-11-10 NOTE — Telephone Encounter (Signed)
 Tried to call patient. Sent to voicemail & couldn't leave message due to it being full. Sent mychart message about appointment details. Will watch mychart to see if it is read.

## 2023-11-23 ENCOUNTER — Encounter: Payer: BC Managed Care – PPO | Admitting: Obstetrics & Gynecology

## 2023-11-23 ENCOUNTER — Other Ambulatory Visit: Payer: BC Managed Care – PPO

## 2023-12-01 ENCOUNTER — Encounter: Payer: Self-pay | Admitting: Obstetrics & Gynecology

## 2023-12-01 ENCOUNTER — Ambulatory Visit: Payer: BC Managed Care – PPO | Attending: Obstetrics & Gynecology

## 2023-12-01 ENCOUNTER — Ambulatory Visit: Payer: BC Managed Care – PPO | Admitting: *Deleted

## 2023-12-01 ENCOUNTER — Ambulatory Visit: Payer: BC Managed Care – PPO | Admitting: Maternal & Fetal Medicine

## 2023-12-01 ENCOUNTER — Other Ambulatory Visit: Payer: Self-pay | Admitting: *Deleted

## 2023-12-01 ENCOUNTER — Other Ambulatory Visit: Payer: Self-pay

## 2023-12-01 ENCOUNTER — Encounter: Payer: Self-pay | Admitting: *Deleted

## 2023-12-01 ENCOUNTER — Ambulatory Visit: Payer: BC Managed Care – PPO | Admitting: Obstetrics & Gynecology

## 2023-12-01 VITALS — BP 127/65 | HR 99

## 2023-12-01 VITALS — BP 121/70 | HR 95 | Wt 178.0 lb

## 2023-12-01 DIAGNOSIS — Z3A19 19 weeks gestation of pregnancy: Secondary | ICD-10-CM

## 2023-12-01 DIAGNOSIS — O285 Abnormal chromosomal and genetic finding on antenatal screening of mother: Secondary | ICD-10-CM

## 2023-12-01 DIAGNOSIS — O280 Abnormal hematological finding on antenatal screening of mother: Secondary | ICD-10-CM

## 2023-12-01 NOTE — Progress Notes (Signed)
 Patient information  Patient Name: Rachel Sellers  Patient MRN:   [id number]  Referring practice: MFM Referring Provider: [facility])  MFM CONSULT  Rachel Sellers is a 33 y.o. G1P0000 at [redacted]w[redacted]d here for ultrasound and consultation. 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 During today's visit we focused on the following concerns:   RE high risk for trisomy 13: The panorama test was risk for trisomy 23.  I discussed that this could represent a true positive or false positive.  Typically a fetus with trisomy 63 will show a number of abnormal ultrasound findings with the most common being cardiac defects CNS abnormalities, kidney abnormality with restriction.  None of these findings are present on today's ultrasound I discussed the role of amniocentesis as well as associated with that procedure.  The patient verbalized understanding and declined amniocentesis.  She understands and accepts the current risk of trisomy 13 based on her cell free DNA report as well as her ultrasound.  Some of the fetal structures were not well-seen today and she will return in 5 weeks for an ultrasound to assess the structures and fetal biometry.  Genetic counseling was also offered but declined.  Sonographic findings Single intrauterine pregnancy at 19w 4d  Fetal cardiac activity:  Observed  and appears normal. Presentation: Breech. The anatomic structures that were well seen appear normal without evidence of soft markers. Due to poor acoustic windows some structures remain suboptimally visualized. Fetal biometry shows the estimated fetal weight at the 67 percentile.  Amniotic fluid: Within normal limits.  MVP: 4.25 cm. Placenta: Posterior Fundal. Adnexa: No adnexal mass visualized. Cervical length: 3.1 cm.  There are limitations of prenatal ultrasound such as the inability to detect certain abnormalities due to poor visualization. Various factors such as fetal position, gestational age and maternal body habitus may increase the difficulty in visualizing the fetal anatomy.    Recommendations -EDD should be 04/22/2024 based on  Early Ultrasound  (09/29/23). -Follow up ultrasound in 4-6 weeks to attempt visualization of the anatomy not seen and reassess the fetal growth -Amniocentesis and genetic counseling was discussed and offered but declined today -Continue routine prenatal care with referring OB provider  Review of Systems: A review of systems was performed and was negative except per HPI   Vitals and Physical Exam    12/01/2023    1:17 PM 10/25/2023   10:32 AM 10/20/2023    1:27 PM  Vitals with BMI  Weight  174 lbs   BMI  30.83   Systolic 127 136 865  Diastolic 65 87 73  Pulse 99 110 79   Sitting comfortably on the sonogram table Nonlabored breathing Normal rate and rhythm Abdomen is nontender  Past pregnancies OB History  Gravida Para Term Preterm AB Living  1 0 0 0 0 0  SAB IAB Ectopic Multiple Live Births  0 0 0 0 0    #  Outcome Date GA Lbr Len/2nd Weight Sex Type Anes PTL Lv  1 Current             I spent 30 minutes reviewing the patients chart, including labs and images as well as counseling the patient about her medical conditions. Greater than 50% of the time was spent in direct face-to-face patient counseling.  Braxton Feathers, DO Maternal fetal  medicine, [place]   12/01/2023  2:15 PM

## 2023-12-01 NOTE — Progress Notes (Signed)
 HIGH-RISK PREGNANCY VISIT Patient name: Rachel Sellers MRN [id number]  Date of birth: 09-19-1990 Chief Complaint:   Routine Prenatal Visit  History of Present Illness:   Rachel Sellers is a 33 y.o. G47P0000 female at [redacted]w[redacted]d with an Estimated Date of Delivery: 04/22/24 being seen today for ongoing management of a high-risk pregnancy complicated by ^T13 risk with normal sonogram.    Today she reports no complaints. Contractions: Not present. Vag. Bleeding: None.  Movement: Present. denies leaking of fluid.      10/18/2023    2:21 PM 01/04/2023   10:43 AM 11/13/2022    4:17 PM 10/09/2022    1:20 PM 09/18/2022    2:32 PM  Depression screen PHQ 2/9  Decreased Interest 1 1 1 2 1   Down, Depressed, Hopeless 0 0 2 1 1   PHQ - 2 Score 1 1 3 3 2   Altered sleeping 3  2 1 3   Tired, decreased energy 3  2 1 2   Change in appetite 3  3 1 3   Feeling bad or failure about yourself  0  1 1 0  Trouble concentrating 0  3 3 1   Moving slowly or fidgety/restless 0  3 3 3   Suicidal thoughts 0  0 0 0  PHQ-9 Score 10  17 13 14   Difficult doing work/chores   Somewhat difficult Very difficult Extremely dIfficult        10/18/2023    2:22 PM 11/13/2022    4:17 PM 10/09/2022    1:21 PM 09/18/2022    2:33 PM  GAD 7 : Generalized Anxiety Score  Nervous, Anxious, on Edge 1 3 3 3   Control/stop worrying 1 3 3 3   Worry too much - different things 1 3 3 3   Trouble relaxing 1 3 3 3   Restless 1 3 3 3   Easily annoyed or irritable 1 3 3 3   Afraid - awful might happen 1 3 3 3   Total GAD 7 Score 7 21 21 21   Anxiety Difficulty  Extremely difficult Extremely difficult Extremely difficult     Review of Systems:   Pertinent items are noted in HPI Denies abnormal vaginal discharge w/ itching/odor/irritation, headaches, visual changes, shortness of breath, chest pain, abdominal pain, severe nausea/vomiting, or problems with urination or bowel movements unless otherwise stated above. Pertinent History Reviewed:  Reviewed  past medical,surgical, social, obstetrical and family history.  Reviewed problem list, medications and allergies. Physical Assessment:   Vitals:   12/01/23 1615  BP: 121/70  Pulse: 95  Weight: 178 lb (80.7 kg)  Body mass index is 31.53 kg/m.           Physical Examination:   General appearance: alert, well appearing, and in no distress  Mental status: alert, oriented to person, place, and time  Skin: warm & dry   Extremities: Edema: None    Cardiovascular: normal heart rate noted  Respiratory: normal respiratory effort, no distress  Abdomen: gravid, soft, non-tender  Pelvic: Cervical exam deferred         Fetal Status:     Movement: Present    Fetal Surveillance Testing today: see MFM scan from today and note from Dr Bryn Gulling   Chaperone:     No results found for this or any previous visit (from the past 24 hours).  Assessment & Plan:  High-risk pregnancy: G1P0000 at [redacted]w[redacted]d with an Estimated Date of Delivery: 04/22/24      ICD-10-CM   1. [redacted] weeks gestation of pregnancy  Z3A.19 INTEGRATED 2    2. Abnormal genetic test during pregnancy: trisomy but normal sonogram  O28.5         Meds: No orders of the defined types were placed in this encounter.   Orders:  Orders Placed This Encounter  Procedures   INTEGRATED 2     Labs/procedures today:   Treatment Plan:  repeat MFM scan 01/06/24, see here 3 weeks    Follow-up: Return in about 3 weeks (around 12/22/2023) for HROB.   Future Appointments  Date Time Provider Department Center  01/05/2024  2:15 PM [facility] NURSE [facility]  01/05/2024  2:30 PM [facility] US4 [facility]    Orders Placed This Encounter  Procedures   INTEGRATED 2   Lazaro Arms  Attending Physician for the Center for [facility] 12/01/2023 4:41 PM

## 2023-12-03 LAB — INTEGRATED 2
AFP MoM: 1.25
Alpha-Fetoprotein: 63.3 ng/mL
Crown Rump Length: 73 mm
DIA MoM: 3.05
DIA Value: 543.6 pg/mL
Estriol, Unconjugated: 3.48 ng/mL
Gest. Age on Collection Date: 13.3 wk
Gestational Age: 19.6 wk
Maternal Age at EDD: 34.3 a
Nuchal Translucency (NT): 1.5 mm
Nuchal Translucency MoM: 0.73
Number of Fetuses: 1
PAPP-A MoM: 0.68
PAPP-A Value: 775.9 ng/mL
Sonographer ID#: 309760
Test Results:: NEGATIVE
hCG MoM: 1.57
hCG Value: 37.2 [IU]/mL
uE3 MoM: 1.57

## 2023-12-22 ENCOUNTER — Encounter: Admitting: Obstetrics & Gynecology

## 2024-01-05 ENCOUNTER — Ambulatory Visit: Attending: Maternal & Fetal Medicine

## 2024-01-05 ENCOUNTER — Ambulatory Visit: Admitting: *Deleted

## 2024-01-05 VITALS — BP 119/67 | HR 85

## 2024-01-05 DIAGNOSIS — O99212 Obesity complicating pregnancy, second trimester: Secondary | ICD-10-CM | POA: Diagnosis not present

## 2024-01-05 DIAGNOSIS — O285 Abnormal chromosomal and genetic finding on antenatal screening of mother: Secondary | ICD-10-CM

## 2024-01-05 DIAGNOSIS — O280 Abnormal hematological finding on antenatal screening of mother: Secondary | ICD-10-CM | POA: Insufficient documentation

## 2024-01-05 DIAGNOSIS — E669 Obesity, unspecified: Secondary | ICD-10-CM | POA: Diagnosis not present

## 2024-01-05 DIAGNOSIS — Z3A24 24 weeks gestation of pregnancy: Secondary | ICD-10-CM

## 2024-01-05 DIAGNOSIS — Z363 Encounter for antenatal screening for malformations: Secondary | ICD-10-CM | POA: Insufficient documentation

## 2024-01-05 DIAGNOSIS — Z3401 Encounter for supervision of normal first pregnancy, first trimester: Secondary | ICD-10-CM | POA: Diagnosis present

## 2024-01-06 ENCOUNTER — Other Ambulatory Visit: Payer: Self-pay

## 2024-01-06 DIAGNOSIS — O285 Abnormal chromosomal and genetic finding on antenatal screening of mother: Secondary | ICD-10-CM

## 2024-02-03 ENCOUNTER — Ambulatory Visit: Attending: Obstetrics and Gynecology

## 2024-02-03 ENCOUNTER — Ambulatory Visit (HOSPITAL_BASED_OUTPATIENT_CLINIC_OR_DEPARTMENT_OTHER): Admitting: Obstetrics

## 2024-02-03 DIAGNOSIS — Z3A28 28 weeks gestation of pregnancy: Secondary | ICD-10-CM | POA: Diagnosis present

## 2024-02-03 DIAGNOSIS — O99213 Obesity complicating pregnancy, third trimester: Secondary | ICD-10-CM

## 2024-02-03 DIAGNOSIS — E669 Obesity, unspecified: Secondary | ICD-10-CM | POA: Diagnosis not present

## 2024-02-03 DIAGNOSIS — O280 Abnormal hematological finding on antenatal screening of mother: Secondary | ICD-10-CM | POA: Diagnosis present

## 2024-02-03 DIAGNOSIS — O285 Abnormal chromosomal and genetic finding on antenatal screening of mother: Secondary | ICD-10-CM

## 2024-02-04 ENCOUNTER — Other Ambulatory Visit: Payer: Self-pay | Admitting: *Deleted

## 2024-02-04 DIAGNOSIS — O280 Abnormal hematological finding on antenatal screening of mother: Secondary | ICD-10-CM

## 2024-02-04 NOTE — Progress Notes (Signed)
 MFM Consult Note  Rachel Sellers is currently at 28 weeks and 5 days.  She has been followed as her cell free DNA test indicated a high risk for trisomy 12.    She denies any problems since her last exam.  She has not had a prenatal visit in 2 months.  On today's exam, the overall EFW of 3 pounds 1 ounces measures at the 65th percentile.    There was normal amniotic fluid noted with a total AFI of 15.3 cm.    Due to her elevated trisomy 13 risk, the patient was offered and declined an amniocentesis for definitive diagnosis again today.    She was encouraged to call your office to set up an appointment for a prenatal visit as soon as possible.    A follow-up growth scan was scheduled in 6 weeks.    Her baby should be tested after birth to determine if it has trisomy 80.    The patient and her partner stated that all of their questions were answered today.  A total of 20 minutes was spent counseling and coordinating the care for this patient.  Greater than 50% of the time was spent in direct face-to-face contact.

## 2024-02-11 ENCOUNTER — Ambulatory Visit (INDEPENDENT_AMBULATORY_CARE_PROVIDER_SITE_OTHER): Admitting: Obstetrics & Gynecology

## 2024-02-11 ENCOUNTER — Other Ambulatory Visit

## 2024-02-11 VITALS — BP 119/77 | HR 93 | Wt 184.5 lb

## 2024-02-11 DIAGNOSIS — Z131 Encounter for screening for diabetes mellitus: Secondary | ICD-10-CM

## 2024-02-11 DIAGNOSIS — O0993 Supervision of high risk pregnancy, unspecified, third trimester: Secondary | ICD-10-CM

## 2024-02-11 DIAGNOSIS — Z3402 Encounter for supervision of normal first pregnancy, second trimester: Secondary | ICD-10-CM

## 2024-02-11 DIAGNOSIS — O285 Abnormal chromosomal and genetic finding on antenatal screening of mother: Secondary | ICD-10-CM

## 2024-02-11 DIAGNOSIS — Z3A29 29 weeks gestation of pregnancy: Secondary | ICD-10-CM | POA: Diagnosis not present

## 2024-02-11 NOTE — Progress Notes (Signed)
 HIGH-RISK PREGNANCY VISIT Patient name: Rachel Sellers MRN [id number]  Date of birth: 1991-04-22 Chief Complaint:   Routine Prenatal Visit  History of Present Illness:   Rachel Sellers is a 33 y.o. G91P0000 female at [redacted]w[redacted]d with an Estimated Date of Delivery: 04/22/24 being seen today for ongoing management of a high-risk pregnancy complicated by Rachel Sellers T13 by NIPS with normal IT testing.    Today she reports no complaints. Contractions: Not present. Vag. Bleeding: None.  Movement: Present. denies leaking of fluid.      10/18/2023    2:21 PM 01/04/2023   10:43 AM 11/13/2022    4:17 PM 10/09/2022    1:20 PM 09/18/2022    2:32 PM  Depression screen PHQ 2/9  Decreased Interest 1 1 1 2 1   Down, Depressed, Hopeless 0 0 2 1 1   PHQ - 2 Score 1 1 3 3 2   Altered sleeping 3  2 1 3   Tired, decreased energy 3  2 1 2   Change in appetite 3  3 1 3   Feeling bad or failure about yourself  0  1 1 0  Trouble concentrating 0  3 3 1   Moving slowly or fidgety/restless 0  3 3 3   Suicidal thoughts 0  0 0 0  PHQ-9 Score 10  17 13 14   Difficult doing work/chores   Somewhat difficult Very difficult Extremely dIfficult        10/18/2023    2:22 PM 11/13/2022    4:17 PM 10/09/2022    1:21 PM 09/18/2022    2:33 PM  GAD 7 : Generalized Anxiety Score  Nervous, Anxious, on Edge 1 3 3 3   Control/stop worrying 1 3 3 3   Worry too much - different things 1 3 3 3   Trouble relaxing 1 3 3 3   Restless 1 3 3 3   Easily annoyed or irritable 1 3 3 3   Afraid - awful might happen 1 3 3 3   Total GAD 7 Score 7 21 21 21   Anxiety Difficulty  Extremely difficult Extremely difficult Extremely difficult     Review of Systems:   Pertinent items are noted in HPI Denies abnormal vaginal discharge w/ itching/odor/irritation, headaches, visual changes, shortness of breath, chest pain, abdominal pain, severe nausea/vomiting, or problems with urination or bowel movements unless otherwise stated above. Pertinent History Reviewed:   Reviewed past medical,surgical, social, obstetrical and family history.  Reviewed problem list, medications and allergies. Physical Assessment:   Vitals:   02/11/24 0919  BP: 119/77  Pulse: 93  Weight: 184 lb 8 oz (83.7 kg)  Body mass index is 32.68 kg/m.           Physical Examination:   General appearance: alert, well appearing, and in no distress  Mental status: alert, oriented to person, place, and time  Skin: warm & dry   Extremities:      Cardiovascular: normal heart rate noted  Respiratory: normal respiratory effort, no distress  Abdomen: gravid, soft, non-tender  Pelvic: Cervical exam deferred         Fetal Status:     Movement: Present    Fetal Surveillance Testing today:    Chaperone:     No results found for this or any previous visit (from the past 24 hours).  Assessment & Plan:  High-risk pregnancy: G1P0000 at [redacted]w[redacted]d with an Estimated Date of Delivery: 04/22/24      ICD-10-CM   1. Supervision of high risk pregnancy in third trimester  O09.93  2. 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   Orders: No orders of the defined types were placed in this encounter.    Labs/procedures today: PN2  Treatment Plan:  follow up 2 weeks    Follow-up: Return in about 2 weeks (around 02/25/2024) for HROB.   Future Appointments  Date Time Provider Department Center  03/16/2024  3:00 PM [facility] PROVIDER 1 [facility]  03/16/2024  3:30 PM [facility]3 [facility]    No orders of the defined types were placed in this encounter.  Wendelyn Halter  Attending Physician for the Center for [facility] 02/11/2024 10:00 AM

## 2024-02-12 LAB — HIV ANTIBODY (ROUTINE TESTING W REFLEX): HIV Screen 4th Generation wRfx: NONREACTIVE

## 2024-02-12 LAB — ANTIBODY SCREEN: Antibody Screen: NEGATIVE

## 2024-02-12 LAB — CBC
Hematocrit: 28.1 % — ABNORMAL LOW (ref 34.0–46.6)
Hemoglobin: 9.2 g/dL — ABNORMAL LOW (ref 11.1–15.9)
MCH: 31.2 pg (ref 26.6–33.0)
MCHC: 32.7 g/dL (ref 31.5–35.7)
MCV: 95 fL (ref 79–97)
Platelets: 308 10*3/uL (ref 150–450)
RBC: 2.95 x10E6/uL — ABNORMAL LOW (ref 3.77–5.28)
RDW: 13.1 % (ref 11.7–15.4)
WBC: 12.1 10*3/uL — ABNORMAL HIGH (ref 3.4–10.8)

## 2024-02-12 LAB — GLUCOSE TOLERANCE, 2 HOURS W/ 1HR
Glucose, 1 hour: 162 mg/dL (ref 70–179)
Glucose, 2 hour: 149 mg/dL (ref 70–152)
Glucose, Fasting: 88 mg/dL (ref 70–91)

## 2024-02-12 LAB — RPR: RPR Ser Ql: NONREACTIVE

## 2024-02-25 ENCOUNTER — Ambulatory Visit: Admitting: Obstetrics & Gynecology

## 2024-02-25 ENCOUNTER — Encounter: Payer: Self-pay | Admitting: Obstetrics & Gynecology

## 2024-02-25 VITALS — BP 121/74 | HR 109 | Wt 185.2 lb

## 2024-02-25 DIAGNOSIS — O0993 Supervision of high risk pregnancy, unspecified, third trimester: Secondary | ICD-10-CM | POA: Diagnosis not present

## 2024-02-25 DIAGNOSIS — O99019 Anemia complicating pregnancy, unspecified trimester: Secondary | ICD-10-CM | POA: Insufficient documentation

## 2024-02-25 DIAGNOSIS — O99013 Anemia complicating pregnancy, third trimester: Secondary | ICD-10-CM | POA: Diagnosis not present

## 2024-02-25 DIAGNOSIS — Z3403 Encounter for supervision of normal first pregnancy, third trimester: Secondary | ICD-10-CM

## 2024-02-25 DIAGNOSIS — Z3A31 31 weeks gestation of pregnancy: Secondary | ICD-10-CM | POA: Diagnosis not present

## 2024-02-25 DIAGNOSIS — D649 Anemia, unspecified: Secondary | ICD-10-CM

## 2024-02-25 NOTE — Progress Notes (Signed)
 HIGH-RISK PREGNANCY VISIT Patient name: Rachel Sellers MRN [id number]  Date of birth: [birth year] Chief Complaint:   Routine Prenatal Visit  History of Present Illness:   Rachel Sellers is a 33 y.o. G59P0000 female at [redacted]w[redacted]d with an Estimated Date of Delivery: 04/22/24 being seen today for ongoing management of a high-risk pregnancy complicated by:  -increased risk T13 Followed by MFM -Anxiety- stable with zoloft   Today she reports no complaints.   Contractions: Not present. Vag. Bleeding: None.  Movement: Present. denies leaking of fluid.      10/18/2023    2:21 PM 01/04/2023   10:43 AM 11/13/2022    4:17 PM 10/09/2022    1:20 PM 09/18/2022    2:32 PM  Depression screen PHQ 2/9  Decreased Interest 1 1 1 2 1   Down, Depressed, Hopeless 0 0 2 1 1   PHQ - 2 Score 1 1 3 3 2   Altered sleeping 3  2 1 3   Tired, decreased energy 3  2 1 2   Change in appetite 3  3 1 3   Feeling bad or failure about yourself  0  1 1 0  Trouble concentrating 0  3 3 1   Moving slowly or fidgety/restless 0  3 3 3   Suicidal thoughts 0  0 0 0  PHQ-9 Score 10  17 13 14   Difficult doing work/chores   Somewhat difficult Very difficult Extremely dIfficult     Current Outpatient Medications  Medication Instructions   aspirin  EC 81 mg, Oral, Daily, Swallow whole.   clonazePAM  (KLONOPIN ) 0.5 mg, Oral, 2 times daily PRN, for anxiety   ondansetron  (ZOFRAN -ODT) 4 mg, Oral, Every 8 hours PRN   prenatal vitamin w/FE, FA (PRENATAL 1 + 1) 27-1 MG TABS tablet 1 tablet, Daily   sertraline  (ZOLOFT ) 100 mg, Oral, Daily   traZODone  (DESYREL ) 50 mg, Oral, At bedtime PRN, for sleep     Review of Systems:   Pertinent items are noted in HPI Denies abnormal vaginal discharge w/ itching/odor/irritation, headaches, visual changes, shortness of breath, chest pain, abdominal pain, severe nausea/vomiting, or problems with urination or bowel movements unless otherwise stated above. Pertinent History Reviewed:  Reviewed past  medical,surgical, social, obstetrical and family history.  Reviewed problem list, medications and allergies. Physical Assessment:   Vitals:   02/25/24 0901  BP: 121/74  Pulse: (!) 109  Weight: 185 lb 3.2 oz (84 kg)  Body mass index is 32.81 kg/m.           Physical Examination:   General appearance: alert, well appearing, and in no distress  Mental status: normal mood, behavior, speech, dress, motor activity, and thought processes  Skin: warm & dry   Extremities: no edema     Cardiovascular: normal heart rate noted  Respiratory: normal respiratory effort, no distress  Abdomen: gravid, soft, non-tender  Pelvic: Cervical exam deferred         Fetal Status: Fetal Heart Rate (bpm): 150 Fundal Height: 30 cm Movement: Present    Fetal Surveillance Testing today: doppler   Chaperone: N/A    No results found for this or any previous visit (from the past 24 hours).   Assessment & Plan:  High-risk pregnancy: G1P0000 at [redacted]w[redacted]d with an Estimated Date of Delivery: 04/22/24   1) [name] T13 Followed by MFM Recent US  normal, f/u scheduled for July  2) Anemia -start oral iron- reviewed recommendations   Meds: No orders of the defined types were placed in this encounter.   Labs/procedures today:  doppler  Treatment Plan:  routine OB care and as outlined above  Reviewed: Preterm labor symptoms and general obstetric precautions including but not limited to vaginal bleeding, contractions, leaking of fluid and fetal movement were reviewed in detail with the patient.  All questions were answered. Pt has home bp cuff. Check bp weekly, let us  know if >140/90.   Follow-up: Return in about 2 weeks (around 03/10/2024) for HROB visit (ok for CNM).   Future Appointments  Date Time Provider Department Center  03/10/2024  9:10 AM Wendelyn Halter, MD [facility]  03/16/2024  3:00 PM [facility] PROVIDER 1 [facility]  03/16/2024  3:30 PM [facility]3 [facility]    No orders of the defined types were  placed in this encounter.   Emmory Solivan, DO Attending Obstetrician & Gynecologist, [facility] for [employer], [facility]

## 2024-03-10 ENCOUNTER — Encounter: Admitting: Obstetrics & Gynecology

## 2024-03-10 DIAGNOSIS — Z0289 Encounter for other administrative examinations: Secondary | ICD-10-CM

## 2024-03-16 ENCOUNTER — Ambulatory Visit: Admitting: Obstetrics and Gynecology

## 2024-03-16 ENCOUNTER — Ambulatory Visit: Attending: Obstetrics and Gynecology

## 2024-03-16 VITALS — BP 118/67 | HR 91

## 2024-03-16 DIAGNOSIS — O99013 Anemia complicating pregnancy, third trimester: Secondary | ICD-10-CM | POA: Diagnosis not present

## 2024-03-16 DIAGNOSIS — O285 Abnormal chromosomal and genetic finding on antenatal screening of mother: Secondary | ICD-10-CM

## 2024-03-16 DIAGNOSIS — O280 Abnormal hematological finding on antenatal screening of mother: Secondary | ICD-10-CM | POA: Diagnosis not present

## 2024-03-16 DIAGNOSIS — O99213 Obesity complicating pregnancy, third trimester: Secondary | ICD-10-CM | POA: Diagnosis not present

## 2024-03-16 DIAGNOSIS — F17200 Nicotine dependence, unspecified, uncomplicated: Secondary | ICD-10-CM | POA: Diagnosis present

## 2024-03-16 DIAGNOSIS — Z3A34 34 weeks gestation of pregnancy: Secondary | ICD-10-CM | POA: Diagnosis present

## 2024-03-16 DIAGNOSIS — E669 Obesity, unspecified: Secondary | ICD-10-CM | POA: Diagnosis not present

## 2024-03-16 DIAGNOSIS — D649 Anemia, unspecified: Secondary | ICD-10-CM

## 2024-04-01 ENCOUNTER — Other Ambulatory Visit: Payer: Self-pay | Admitting: Family Medicine

## 2024-04-06 ENCOUNTER — Encounter: Payer: Self-pay | Admitting: Family Medicine

## 2024-04-06 NOTE — Progress Notes (Signed)
 After review, MFM consult with provider is not indicated for today  Arna Ranks, MD 04/06/2024 4:53 PM  [facility]

## 2024-04-07 ENCOUNTER — Other Ambulatory Visit: Payer: Self-pay

## 2024-04-10 ENCOUNTER — Ambulatory Visit: Admitting: Family Medicine

## 2024-04-10 VITALS — BP 116/76 | HR 97 | Temp 98.8°F | Ht 63.0 in | Wt 189.8 lb

## 2024-04-10 DIAGNOSIS — F32A Depression, unspecified: Secondary | ICD-10-CM | POA: Diagnosis not present

## 2024-04-10 DIAGNOSIS — M654 Radial styloid tenosynovitis [de Quervain]: Secondary | ICD-10-CM | POA: Diagnosis not present

## 2024-04-10 DIAGNOSIS — F419 Anxiety disorder, unspecified: Secondary | ICD-10-CM

## 2024-04-10 MED ORDER — SERTRALINE HCL 100 MG PO TABS: 100.0000 mg | ORAL_TABLET | Freq: Every day | ORAL | 3 refills | Status: AC

## 2024-04-10 NOTE — Progress Notes (Signed)
 Subjective:  Patient ID: Rachel Sellers, female    DOB: 1991-04-05  Age: 33 y.o. MRN: [id number]  CC: Medication refill, left wrist pain   HPI:  33 year old female who is currently pregnant, 38 weeks and 2 days presents for evaluation of the above.  Anxiety and depression stable on Zoloft .  Needs refill.    Patient also reports a 42-month history of left wrist pain particularly on the radial aspect.  Worse with thumb movement.  She does work from home.  No relieving factors.  No recent fall, trauma, or injury.  Seems to be getting worse.  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    Social Hx   Social History   Socioeconomic History   Marital status: Single    Spouse name: Not on file   Number of children: Not on file   Years of education: Not on file   Highest education level: 12th grade  Occupational History   Not on file  Tobacco Use   Smoking status: Every Day    Current packs/day: 1.00    Average packs/day: 1 pack/day for 13.0 years (13.0 ttl pk-yrs)    Types: Cigarettes   Smokeless tobacco: Never  Vaping Use   Vaping status: Some Days   Substances: THC  Substance and Sexual Activity   Alcohol use: Not Currently    Comment: occassionally   Drug use: Not Currently    Types: Marijuana   Sexual activity: Not Currently    Birth control/protection: None  Other Topics Concern   Not on file  Social History Narrative   Not on file   Social Drivers of Health   Financial Resource Strain: Low Risk  (04/09/2024)   Overall Financial Resource Strain (CARDIA)    Difficulty of Paying Living Expenses: Not very  hard  Food Insecurity: No Food Insecurity (04/09/2024)   Hunger Vital Sign    Worried About Running Out of Food in the Last Year: Never true    Ran Out of Food in the Last Year: Never true  Transportation Needs: No Transportation Needs (04/09/2024)   PRAPARE - Administrator, Civil Service (Medical): No    Lack of Transportation (Non-Medical): No  Physical Activity: Inactive (04/09/2024)   Exercise Vital Sign    Days of Exercise per Week: 0 days    Minutes of Exercise per Session: Not on file  Stress: Stress Concern Present (04/09/2024)   [employer] of Occupational Health - Occupational Stress Questionnaire    Feeling of Stress: Very much  Social Connections: Moderately Isolated (04/09/2024)   Social Connection and Isolation Panel    Frequency of Communication with Friends and Family: More than three times a week    Frequency of Social Gatherings with Friends and Family: Once a week    Attends Religious Services: 1 to 4 times per year    Active Member of [employer] or Organizations: No    Attends Engineer, structural: Not on file    Marital Status: Never married    Review of Systems Per HPI  Objective:  BP 116/76   Pulse 97   Temp 98.8 F (37.1 C)  Ht 5' 3 (1.6 m)   Wt 189 lb 12.8 oz (86.1 kg)   LMP  (LMP Unknown)   SpO2 98%   BMI 33.62 kg/m      04/10/2024    8:40 AM 03/16/2024    3:44 PM 02/25/2024    9:01 AM  BP/Weight  Systolic BP 116 118 121  Diastolic BP 76 67 74  Wt. (Lbs) 189.8  185.2  BMI 33.62 kg/m2  32.81 kg/m2    Physical Exam Vitals and nursing note reviewed.  Constitutional:      General: She is not in acute distress.    Appearance: Normal appearance.  HENT:     Head: Normocephalic and atraumatic.  Cardiovascular:     Rate and Rhythm: Normal rate and regular rhythm.  Pulmonary:     Effort: Pulmonary effort is normal.     Breath sounds: Normal breath sounds.  Musculoskeletal:     Comments: Left wrist -tenderness over the  radial aspect.  Positive Finkelstein test.  Neurological:     Mental Status: She is alert.     Lab Results  Component Value Date   WBC 12.1 (H) 02/11/2024   HGB 9.2 (L) 02/11/2024   HCT 28.1 (L) 02/11/2024   PLT 308 02/11/2024   GLUCOSE 88 10/18/2023   ALT 50 (H) 10/18/2023   AST 29 10/18/2023   NA 137 10/18/2023   K 3.7 10/18/2023   CL 101 10/18/2023   CREATININE 0.57 10/18/2023   BUN 7 10/18/2023   CO2 19 (L) 10/18/2023   TSH 0.724 10/16/2023   HGBA1C 5.7 (H) 10/18/2023     Assessment & Plan:  Anxiety and depression Assessment & Plan: Stable.  Zoloft  refilled.  Orders: -     Sertraline  HCl; Take 1 tablet (100 mg total) by mouth daily.  Dispense: 90 tablet; Refill: 3  De Quervain's tenosynovitis Assessment & Plan: Advised rest, ice, Tylenol .  Rx given for thumb spica splint.     Follow-up:  Return in about 6 months (around 10/11/2024).  Jacqulyn Ahle DO [facility]

## 2024-04-10 NOTE — Assessment & Plan Note (Signed)
 Advised rest, ice, Tylenol .  Rx given for thumb spica splint.

## 2024-04-10 NOTE — Patient Instructions (Signed)
 Rest. Splint. Ice. Tylenol .  If continues to persist, will refer to Ortho for injection.  Take care  Dr. Bluford

## 2024-04-10 NOTE — Assessment & Plan Note (Signed)
 Stable.  Zoloft  refilled.

## 2024-04-13 ENCOUNTER — Encounter (HOSPITAL_COMMUNITY): Payer: Self-pay | Admitting: Obstetrics & Gynecology

## 2024-04-13 ENCOUNTER — Inpatient Hospital Stay (HOSPITAL_COMMUNITY)
Admission: AD | Admit: 2024-04-13 | Discharge: 2024-04-14 | Disposition: A | Discharge status: Home / Self Care | Attending: Obstetrics & Gynecology | Admitting: Obstetrics & Gynecology

## 2024-04-13 DIAGNOSIS — N1330 Unspecified hydronephrosis: Secondary | ICD-10-CM | POA: Diagnosis not present

## 2024-04-13 DIAGNOSIS — O99891 Other specified diseases and conditions complicating pregnancy: Secondary | ICD-10-CM | POA: Insufficient documentation

## 2024-04-13 DIAGNOSIS — O26893 Other specified pregnancy related conditions, third trimester: Secondary | ICD-10-CM | POA: Diagnosis not present

## 2024-04-13 DIAGNOSIS — R319 Hematuria, unspecified: Secondary | ICD-10-CM | POA: Insufficient documentation

## 2024-04-13 DIAGNOSIS — R109 Unspecified abdominal pain: Secondary | ICD-10-CM

## 2024-04-13 DIAGNOSIS — Z3A38 38 weeks gestation of pregnancy: Secondary | ICD-10-CM

## 2024-04-13 DIAGNOSIS — O471 False labor at or after 37 completed weeks of gestation: Secondary | ICD-10-CM | POA: Diagnosis present

## 2024-04-13 LAB — URINALYSIS, ROUTINE W REFLEX MICROSCOPIC
Bilirubin Urine: NEGATIVE
Glucose, UA: NEGATIVE mg/dL
Ketones, ur: NEGATIVE mg/dL
Nitrite: NEGATIVE
Protein, ur: NEGATIVE mg/dL
RBC / HPF: >50 RBC/hpf (ref 0–5)
Specific Gravity, Urine: 1.011 (ref 1.005–1.030)
pH: 7 (ref 5.0–8.0)

## 2024-04-13 MED ORDER — ONDANSETRON 4 MG PO TBDP
4.0000 mg | ORAL_TABLET | Freq: Once | ORAL | Status: AC
  Administered 2024-04-14: 4 mg via ORAL
  Filled 2024-04-13: qty 1

## 2024-04-13 NOTE — MAU Provider Note (Signed)
 MAU Provider Note  Chief Complaint: Contractions  SUBJECTIVE HPI: Rachel Sellers is a 33 y.o. G1P0000 at [redacted]w[redacted]d by early ultrasound who presents to maternity admissions reporting left flank pain that radiates to the left anterior side. Hx of similar with prior kidney stones. Last bowel movement this morning, struggles with constipation. Irregular contractions since start of pain. Nausea and emessis after start of pain. Similar thing happen on Wednesday but only lasted a few hours then stopped. Today the pain and emesis started at 1600 and has gotten worse. + FM. Denies fever, chills, leakage of fluid, vaginal discharge or bleeding, dysuria.  Pregnancy c/b transsphincteric anal fistula, anxiety & depression, .   Receives [facility] with [facility].   HPI  Past Medical History:  Diagnosis Date   Anemia    Anxiety    Bipolar 1 disorder (HCC)    Depression    History of kidney stones    Hyperemesis 10/16/2023   Admit 2/1-2/2     Hypokalemia 10/16/2023   10/16/23 on admit K+ 2.5, was 3.0 on d/c, rx'd K+ po 40meq daily (didn't start) K+ 3.7 (2/3)     OCD (obsessive compulsive disorder)    PONV (postoperative nausea and vomiting)    Renal disorder    kidney stones   Vaginal Pap smear, abnormal    Past Surgical History:  Procedure Laterality Date   ANAL FISTULOTOMY N/A 07/31/2022   Procedure: EXAM UNDER ANESTHESIA WITH SETON PLACEMENT;  Surgeon: Kallie Manuelita BROCKS, MD;  Location: [facility];  Service: General;  Laterality: N/A;   BIOPSY  12/22/2022   Procedure: BIOPSY;  Surgeon: Cindie Carlin POUR, DO;  Location: [facility];  Service: Endoscopy;;   CERVICAL CONIZATION W/BX Left 08/23/2019   Procedure: LASER ABLATION OF CERVIX;  Surgeon: Jayne Vonn DEL, MD;  Location: [facility];  Service: Gynecology;  Laterality: Left;   COLONOSCOPY WITH PROPOFOL  N/A 12/22/2022   Procedure: COLONOSCOPY WITH PROPOFOL ;  Surgeon: Cindie Carlin POUR, DO;  Location: [facility];  Service: Endoscopy;  Laterality: N/A;   2:00 pm   INCISION AND DRAINAGE ABSCESS Left 10/30/2015   Procedure: INCISION AND DRAINAGE PERI-RECTAL ABSCESS;  Surgeon: Oneil Budge, MD;  Location: [facility];  Service: General;  Laterality: Left;   INCISION AND DRAINAGE ABSCESS N/A 11/09/2022   Procedure: INCISION AND DRAINAGE PERIRECTAL ABSCESS;  Surgeon: Evonnie Dorothyann LABOR, DO;  Location: [facility];  Service: General;  Laterality: N/A;   INCISION AND DRAINAGE PERIRECTAL ABSCESS N/A 06/05/2022   Procedure: IRRIGATION AND DEBRIDEMENT PERIRECTAL ABSCESS; PENROSE DRAIN PLACED;  Surgeon: Kallie Manuelita BROCKS, MD;  Location: [facility];  Service: General;  Laterality: N/A;   LIGATION OF INTERNAL FISTULA TRACT N/A 03/05/2023   Procedure: LIGATION OF INTERNAL FISTULA TRACT;  Surgeon: Debby Hila, MD;  Location: [facility];  Service: General;  Laterality: N/A;   MULTIPLE TOOTH EXTRACTIONS  05/22/2022   Social History   Socioeconomic History   Marital status: Single    Spouse name: Not on file   Number of children: Not on file   Years of education: Not on file   Highest education level: 12th grade  Occupational History   Not on file  Tobacco Use   Smoking status: Every Day    Current packs/day: 1.00    Average packs/day: 1 pack/day for 13.0 years (13.0 ttl pk-yrs)    Types: Cigarettes   Smokeless tobacco: Never  Vaping Use   Vaping status: Some Days   Substances: THC  Substance and Sexual  Activity   Alcohol use: Not Currently    Comment: occassionally   Drug use: Not Currently    Types: Marijuana   Sexual activity: Not Currently    Birth control/protection: None  Other Topics Concern   Not on file  Social History Narrative   Not on file   Social Drivers of Health   Financial Resource Strain: Low Risk  (04/09/2024)   Overall Financial Resource Strain (CARDIA)    Difficulty of Paying Living Expenses: Not very hard  Food Insecurity: No Food Insecurity (04/09/2024)   Hunger Vital Sign    Worried About Running Out of  Food in the Last Year: Never true    Ran Out of Food in the Last Year: Never true  Transportation Needs: No Transportation Needs (04/09/2024)   PRAPARE - Administrator, Civil Service (Medical): No    Lack of Transportation (Non-Medical): No  Physical Activity: Inactive (04/09/2024)   Exercise Vital Sign    Days of Exercise per Week: 0 days    Minutes of Exercise per Session: Not on file  Stress: Stress Concern Present (04/09/2024)   [employer] of Occupational Health - Occupational Stress Questionnaire    Feeling of Stress: Very much  Social Connections: Moderately Isolated (04/09/2024)   Social Connection and Isolation Panel    Frequency of Communication with Friends and Family: More than three times a week    Frequency of Social Gatherings with Friends and Family: Once a week    Attends Religious Services: 1 to 4 times per year    Active Member of [employer] or Organizations: No    Attends Engineer, structural: Not on file    Marital Status: Never married  Intimate Partner Violence: Not At Risk (10/18/2023)   Humiliation, Afraid, Rape, and Kick questionnaire    Fear of Current or Ex-Partner: No    Emotionally Abused: No    Physically Abused: No    Sexually Abused: No   No current facility-administered medications on file prior to encounter.   Current Outpatient Medications on File Prior to Encounter  Medication Sig Dispense Refill   Ferrous Sulfate (IRON PO) Take by mouth.     sertraline  (ZOLOFT ) 100 MG tablet Take 1 tablet (100 mg total) by mouth daily. 90 tablet 3   aspirin  EC 81 MG tablet Take 1 tablet (81 mg total) by mouth daily. Swallow whole. 90 tablet 3   ondansetron  (ZOFRAN -ODT) 4 MG disintegrating tablet Take 1 tablet (4 mg total) by mouth every 8 (eight) hours as needed. 20 tablet 0   prenatal vitamin w/FE, FA (PRENATAL 1 + 1) 27-1 MG TABS tablet Take 1 tablet by mouth daily at 12 noon.     Allergies  Allergen Reactions   Gentamicin Other (See  Comments)    Renal issues.   Vancomycin  Other (See Comments)    Kidney failure    ROS:  Pertinent positives/negatives listed above.  I have reviewed patient's Past Medical Hx, Surgical Hx, Family Hx, Social Hx, medications and allergies.   Physical Exam  Patient Vitals for the past 24 hrs:  BP Temp Pulse Resp SpO2 Height Weight  04/14/24 0229 129/61 -- 80 -- -- -- --  04/14/24 0115 -- -- -- -- 100 % -- --  04/14/24 0100 -- -- -- -- 100 % -- --  04/14/24 0045 -- -- -- -- 100 % -- --  04/14/24 0030 -- -- -- -- 99 % -- --  04/14/24 0015 -- -- -- --  100 % -- --  04/13/24 2214 135/83 -- -- -- -- -- --  04/13/24 2208 -- (!) 97.3 F (36.3 C) 91 20 99 % 5' 3 (1.6 m) 87.1 kg   Constitutional: Well-developed, well-nourished female in moderate painful distress  Cardiovascular: normal rate Respiratory: normal effort GI: Abd soft, non-tender, no CVA tenderness, no suprapubic tenderness. Gravid.  MS: Extremities nontender, no edema, normal ROM Neurologic: Alert and oriented x 4  GU: Neg CVAT.  FHT:  Baseline 145, moderate variability, accelerations present, no decelerations Contractions: q 2-4.5 mins  Dilation: 3 Effacement (%): 30 Cervical Position: Posterior Station: -3 Exam by:: Loyola, MD  LAB RESULTS Results for orders placed or performed during the hospital encounter of 04/13/24 (from the past 24 hours)  Urinalysis, Routine w reflex microscopic -Urine, Clean Catch     Status: Abnormal   Collection Time: 04/13/24 10:20 PM  Result Value Ref Range   Color, Urine YELLOW YELLOW   APPearance CLOUDY (A) CLEAR   Specific Gravity, Urine 1.011 1.005 - 1.030   pH 7.0 5.0 - 8.0   Glucose, UA NEGATIVE NEGATIVE mg/dL   Hgb urine dipstick MODERATE (A) NEGATIVE   Bilirubin Urine NEGATIVE NEGATIVE   Ketones, ur NEGATIVE NEGATIVE mg/dL   Protein, ur NEGATIVE NEGATIVE mg/dL   Nitrite NEGATIVE NEGATIVE   Leukocytes,Ua TRACE (A) NEGATIVE   RBC / HPF >50 0 - 5 RBC/hpf   WBC, UA 6-10 0 -  5 WBC/hpf   Bacteria, UA RARE (A) NONE SEEN   Squamous Epithelial / HPF 11-20 0 - 5 /HPF   Mucus PRESENT   CBC     Status: Abnormal   Collection Time: 04/14/24 12:36 AM  Result Value Ref Range   WBC 16.3 (H) 4.0 - 10.5 K/uL   RBC 3.57 (L) 3.87 - 5.11 MIL/uL   Hemoglobin 10.9 (L) 12.0 - 15.0 g/dL   HCT 67.8 (L) 63.9 - 53.9 %   MCV 89.9 80.0 - 100.0 fL   MCH 30.5 26.0 - 34.0 pg   MCHC 34.0 30.0 - 36.0 g/dL   RDW 86.4 88.4 - 84.4 %   Platelets 254 150 - 400 K/uL   nRBC 0.0 0.0 - 0.2 %  Comprehensive metabolic panel     Status: Abnormal   Collection Time: 04/14/24 12:36 AM  Result Value Ref Range   Sodium 138 135 - 145 mmol/L   Potassium 3.8 3.5 - 5.1 mmol/L   Chloride 110 98 - 111 mmol/L   CO2 16 (L) 22 - 32 mmol/L   Glucose, Bld 119 (H) 70 - 99 mg/dL   BUN 13 6 - 20 mg/dL   Creatinine, Ser 8.76 (H) 0.44 - 1.00 mg/dL   Calcium  9.7 8.9 - 10.3 mg/dL   Total Protein 5.9 (L) 6.5 - 8.1 g/dL   Albumin 2.6 (L) 3.5 - 5.0 g/dL   AST 17 15 - 41 U/L   ALT 10 0 - 44 U/L   Alkaline Phosphatase 178 (H) 38 - 126 U/L   Total Bilirubin 0.4 0.0 - 1.2 mg/dL   GFR, Estimated 59 (L) >60 mL/min   Anion gap 12 5 - 15  Lipase, blood     Status: None   Collection Time: 04/14/24 12:36 AM  Result Value Ref Range   Lipase 31 11 - 51 U/L    O/Positive/-- (02/03 1512)  IMAGING US  RENAL Result Date: 04/14/2024 CLINICAL DATA:  Left flank pain EXAM: RENAL / URINARY TRACT ULTRASOUND COMPLETE COMPARISON:  None Available. FINDINGS: Right  Kidney: Renal measurements: 12.4 x 6.0 x 6.2 cm = volume: 243 mL. Echogenicity within normal limits. No mass or hydronephrosis visualized. Left Kidney: Renal measurements: 13.9 x 7.6 x 6.1 cm = volume: 334 mL. Normal echotexture. Mild left hydronephrosis. No mass. Bladder: Appears normal for degree of bladder distention. Other: None. IMPRESSION: Mild left hydronephrosis. Electronically Signed   By: Franky Crease M.D.   On: 04/14/2024 01:17   US  MFM OB FOLLOW UP Result Date:  03/16/2024 ----------------------------------------------------------------------  OBSTETRICS REPORT                       (Signed Final 03/16/2024 04:05 pm) ---------------------------------------------------------------------- Patient Info  ID #:       [id number]                          D.O.B.:  08/23/1991 (33 yrs)(F)  Name:       LAYMON LOISE AMOR               Visit Date: 03/16/2024 03:42 pm ---------------------------------------------------------------------- Performed By  Attending:        Fredia Fresh MD        Ref. Address:     [facility]                                                             [facility]  Performed By:     Joyann Allen RDMS      [facility]  Referred By:      DELON CHRISTELLA PRUDE DO ---------------------------------------------------------------------- Orders  #  Description                           Code        Ordered By  1  US  MFM OB FOLLOW UP                   23183.98    YU FANG ----------------------------------------------------------------------  #  Order #                     Accession #                Episode #  1  [id number]                   [id number]                 [id number] ---------------------------------------------------------------------- Indications  Abnormal biochemical screen (Increased         O28.9  Risk T13)  Obesity complicating pregnancy, third          O99.213  trimester  Encounter for other antenatal screening        Z36.2  follow-up  [redacted] weeks gestation of pregnancy                Z3A.34  Neg Horizon  Anemia during pregnancy in third trimester     O99.013 ---------------------------------------------------------------------- Vital Signs  BP:          118/67  ---------------------------------------------------------------------- Fetal Evaluation  Num Of Fetuses:         1  Fetal Heart Rate(bpm):  151  Cardiac Activity:       Observed  Presentation:           Cephalic  Placenta:               Anterior  P. Cord Insertion:      Previously seen  Amniotic Fluid  AFI FV:      Within normal limits  AFI Sum(cm)     %Tile       Largest Pocket(cm)  12.92           42          4.53  RUQ(cm)       RLQ(cm)       LUQ(cm)        LLQ(cm)  2.09          4.53          3.08           3.22 ---------------------------------------------------------------------- Biometry  BPD:      91.6  mm     G. Age:  37w 1d         97  %    CI:        79.99   %    70 - 86                                                          FL/HC:      19.6   %    20.1 - 22.3  HC:      323.6  mm     G. Age:  36w 4d         64  %    HC/AC:      1.03        0.93 - 1.11  AC:      313.8  mm     G. Age:  35w 2d         73  %    FL/BPD:     69.1   %    71 - 87  FL:  63.3  mm     G. Age:  32w 5d          5  %    FL/AC:      20.2   %    20 - 24  LV:          7  mm  Est. FW:    2554  gm    5 lb 10 oz      53  % ---------------------------------------------------------------------- OB History  Gravidity:    1  Living:       0 ---------------------------------------------------------------------- Gestational Age  U/S Today:     35w 3d                                        EDD:   04/17/24  Best:          34w 5d     Det. By:  Early Ultrasound         EDD:   04/22/24                                      (09/29/23) ---------------------------------------------------------------------- Anatomy  Cranium:               Previously seen        Aortic Arch:            Previously seen  Cavum:                 Previously seen        Ductal Arch:            Previously seen  Ventricles:            Appears normal         Diaphragm:              Appears normal  Choroid Plexus:        Previously seen        Stomach:                Appears normal,  left                                                                        sided  Cerebellum:            Previously seen        Abdomen:                Previously seen  Posterior Fossa:       Previously seen        Abdominal Wall:         Previously seen  Face:                  Orbits and profile     Cord Vessels:           Previously seen                         previously seen  Lips:  Previously seen        Kidneys:                Appear normal  Thoracic:              Previously seen        Bladder:                Appears normal  Heart:                 Appears normal         Spine:                  Previously seen                         (4CH, axis, and                         situs)  RVOT:                  Previously seen        Upper Extremities:      Previously seen  LVOT:                  Previously seen        Lower Extremities:      Previously seen ---------------------------------------------------------------------- Cervix Uterus Adnexa  Cervix  Not visualized (advanced GA >24wks)  Uterus  No abnormality visualized.  Right Ovary  Not visualized.  Left Ovary  Not visualized.  Cul De Sac  No free fluid seen.  Adnexa  No abnormality visualized ---------------------------------------------------------------------- Impression  Fetal growth is appropriate for gestational age.  Amniotic fluid  normal good fetal activity seen.  No obvious fetal structural  defects are seen.  We reassured the patient of the findings. ---------------------------------------------------------------------- Recommendations  -No follow-up appointments were made . ----------------------------------------------------------------------                 Fredia Fresh, MD Electronically Signed Final Report   03/16/2024 04:05 pm ----------------------------------------------------------------------    MAU Management/MDM: Orders Placed This Encounter  Procedures   Culture, OB Urine   US  RENAL   Urinalysis, Routine w reflex  microscopic -Urine, Clean Catch   CBC   Comprehensive metabolic panel   Lipase, blood   Discharge patient    Meds ordered this encounter  Medications   ondansetron  (ZOFRAN -ODT) disintegrating tablet 4 mg   lactated ringers  bolus 1,000 mL   fentaNYL  (SUBLIMAZE ) injection 50-100 mcg   ondansetron  (ZOFRAN ) injection 4 mg    Available prenatal records reviewed.  ASSESSMENT 1. [redacted] weeks gestation of pregnancy   2. Left flank pain   3. Hematuria, unspecified type   Renal US : mild hydronephrosis. Pain similar to hx of renal stones - mild AKI and leukocytosis. Afebrile. No CVA tenderness. UA with blood, leukocytes, few bacteria supportive of stones.  - trial zofran , 1L LR bolus, fentanyl  --> complete resolution of symptoms.   NST reactive. SVE 3/30/-3, posterior. Ctx resolved after fluids and fentanyl  without return after an hour.    PLAN Discharge home with strict return precautions. Continue prenatal care as scheduled.  Allergies as of 04/14/2024       Reactions   Gentamicin Other (See Comments)   Renal issues.   Vancomycin  Other (See Comments)   Kidney failure        Medication List     TAKE these medications  aspirin  EC 81 MG tablet Take 1 tablet (81 mg total) by mouth daily. Swallow whole.   IRON PO Take by mouth.   ondansetron  4 MG disintegrating tablet Commonly known as: ZOFRAN -ODT Take 1 tablet (4 mg total) by mouth every 8 (eight) hours as needed.   prenatal vitamin w/FE, FA 27-1 MG Tabs tablet Take 1 tablet by mouth daily at 12 noon.   sertraline  100 MG tablet Commonly known as: ZOLOFT  Take 1 tablet (100 mg total) by mouth daily.        Follow-up Information     [facility] Follow up.   Contact information: [address] [place]  [address] [phone]                Mardy Shropshire, MD FMOB Fellow, Faculty practice [facility], [facility]  04/14/2024  2:53 AM

## 2024-04-13 NOTE — MAU Note (Signed)
 Rachel Sellers is a 33 y.o. at [redacted]w[redacted]d here in MAU reporting L flank pain that radiates to L side. Abdomen gets tight off and on. Pain started Weds but stopped and returned today 1600. Having n/v. Hx of kidney stones and reports pain feels like kidney stone pain  LMP: na  Onset of complaint: this afternoon Pain score: 10 Vitals:   04/13/24 2208  Pulse: 91  Resp: 20  Temp: (!) 97.3 F (36.3 C)  SpO2: 99%     FHT: 174  Lab orders placed from triage: u/a

## 2024-04-14 ENCOUNTER — Inpatient Hospital Stay (HOSPITAL_COMMUNITY)

## 2024-04-14 DIAGNOSIS — O99891 Other specified diseases and conditions complicating pregnancy: Secondary | ICD-10-CM | POA: Diagnosis not present

## 2024-04-14 LAB — COMPREHENSIVE METABOLIC PANEL WITH GFR
ALT: 10 U/L (ref 0–44)
AST: 17 U/L (ref 15–41)
Albumin: 2.6 g/dL — ABNORMAL LOW (ref 3.5–5.0)
Alkaline Phosphatase: 178 U/L — ABNORMAL HIGH (ref 38–126)
Anion gap: 12 (ref 5–15)
BUN: 13 mg/dL (ref 6–20)
CO2: 16 mmol/L — ABNORMAL LOW (ref 22–32)
Calcium: 9.7 mg/dL (ref 8.9–10.3)
Chloride: 110 mmol/L (ref 98–111)
Creatinine, Ser: 1.23 mg/dL — ABNORMAL HIGH (ref 0.44–1.00)
GFR, Estimated: 59 mL/min — ABNORMAL LOW (ref >60)
Glucose, Bld: 119 mg/dL — ABNORMAL HIGH (ref 70–99)
Potassium: 3.8 mmol/L (ref 3.5–5.1)
Sodium: 138 mmol/L (ref 135–145)
Total Bilirubin: 0.4 mg/dL (ref 0.0–1.2)
Total Protein: 5.9 g/dL — ABNORMAL LOW (ref 6.5–8.1)

## 2024-04-14 LAB — CBC
HCT: 32.1 % — ABNORMAL LOW (ref 36.0–46.0)
Hemoglobin: 10.9 g/dL — ABNORMAL LOW (ref 12.0–15.0)
MCH: 30.5 pg (ref 26.0–34.0)
MCHC: 34 g/dL (ref 30.0–36.0)
MCV: 89.9 fL (ref 80.0–100.0)
Platelets: 254 K/uL (ref 150–400)
RBC: 3.57 MIL/uL — ABNORMAL LOW (ref 3.87–5.11)
RDW: 13.5 % (ref 11.5–15.5)
WBC: 16.3 K/uL — ABNORMAL HIGH (ref 4.0–10.5)
nRBC: 0 % (ref 0.0–0.2)

## 2024-04-14 LAB — LIPASE, BLOOD: Lipase: 31 U/L (ref 11–51)

## 2024-04-14 MED ORDER — ONDANSETRON HCL 4 MG/2ML IJ SOLN
4.0000 mg | Freq: Once | INTRAMUSCULAR | Status: AC
  Administered 2024-04-14: 4 mg via INTRAVENOUS
  Filled 2024-04-14: qty 2

## 2024-04-14 MED ORDER — FENTANYL CITRATE (PF) 100 MCG/2ML IJ SOLN
50.0000 ug | INTRAMUSCULAR | Status: DC | PRN
  Administered 2024-04-14: 100 ug via INTRAVENOUS
  Filled 2024-04-14: qty 2

## 2024-04-14 MED ORDER — LACTATED RINGERS IV BOLUS
1000.0000 mL | Freq: Once | INTRAVENOUS | Status: AC
  Administered 2024-04-14: 1000 mL via INTRAVENOUS

## 2024-04-15 LAB — CULTURE, OB URINE

## 2024-04-27 ENCOUNTER — Ambulatory Visit (INDEPENDENT_AMBULATORY_CARE_PROVIDER_SITE_OTHER): Admitting: Obstetrics & Gynecology

## 2024-04-27 ENCOUNTER — Other Ambulatory Visit (HOSPITAL_COMMUNITY)
Admission: RE | Admit: 2024-04-27 | Discharge: 2024-04-27 | Disposition: A | Discharge status: Home / Self Care | Source: Ambulatory Visit | Attending: Obstetrics & Gynecology | Admitting: Obstetrics & Gynecology

## 2024-04-27 ENCOUNTER — Encounter: Payer: Self-pay | Admitting: Obstetrics & Gynecology

## 2024-04-27 VITALS — BP 129/81 | HR 101 | Wt 189.0 lb

## 2024-04-27 DIAGNOSIS — Z3A40 40 weeks gestation of pregnancy: Secondary | ICD-10-CM | POA: Insufficient documentation

## 2024-04-27 DIAGNOSIS — O0993 Supervision of high risk pregnancy, unspecified, third trimester: Secondary | ICD-10-CM | POA: Insufficient documentation

## 2024-04-27 DIAGNOSIS — O480 Post-term pregnancy: Secondary | ICD-10-CM | POA: Diagnosis not present

## 2024-04-27 NOTE — Progress Notes (Signed)
   LOW-RISK PREGNANCY VISIT Patient name: Rachel Sellers MRN [id number]  Date of birth: 01-17-1991 Chief Complaint:   Routine Prenatal Visit  History of Present Illness:   Rachel Sellers is a 33 y.o. G31P0000 female at [redacted]w[redacted]d with an Estimated Date of Delivery: 04/22/24 being seen today for ongoing management of a low-risk pregnancy.     04/10/2024    8:47 AM 10/18/2023    2:21 PM 01/04/2023   10:43 AM 11/13/2022    4:17 PM 10/09/2022    1:20 PM  Depression screen PHQ 2/9  Decreased Interest 1 1 1 1 2   Down, Depressed, Hopeless 1 0 0 2 1  PHQ - 2 Score 2 1 1 3 3   Altered sleeping 3 3  2 1   Tired, decreased energy 3 3  2 1   Change in appetite 1 3  3 1   Feeling bad or failure about yourself  0 0  1 1  Trouble concentrating 3 0  3 3  Moving slowly or fidgety/restless 2 0  3 3  Suicidal thoughts 0 0  0 0  PHQ-9 Score 14 10  17 13   Difficult doing work/chores Very difficult   Somewhat difficult Very difficult    Today she reports no complaints. Contractions: Irritability. Vag. Bleeding: None.  Movement: Present. denies leaking of fluid. Review of Systems:   Pertinent items are noted in HPI Denies abnormal vaginal discharge w/ itching/odor/irritation, headaches, visual changes, shortness of breath, chest pain, abdominal pain, severe nausea/vomiting, or problems with urination or bowel movements unless otherwise stated above. Pertinent History Reviewed:  Reviewed past medical,surgical, social, obstetrical and family history.  Reviewed problem list, medications and allergies. Physical Assessment:   Vitals:   04/27/24 1403  BP: 129/81  Pulse: (!) 101  Weight: 189 lb (85.7 kg)  Body mass index is 33.48 kg/m.        Physical Examination:   General appearance: Well appearing, and in no distress  Mental status: Alert, oriented to person, place, and time  Skin: Warm & dry  Cardiovascular: Normal heart rate noted  Respiratory: Normal respiratory effort, no distress  Abdomen: Soft,  gravid, nontender  Pelvic: Cervical exam performed  3/50/-2/vertex/midplane/soft        Extremities:    Fetal Status:     Movement: Present    Chaperone: Rachel Fischer LPN    No results found for this or any previous visit (from the past 24 hours).  Assessment & Plan:  1) Low-risk pregnancy G1P0000 at [redacted]w[redacted]d with an Estimated Date of Delivery: 04/22/24   2) ^T13 risk with normal scan,   No Prenatal visits since 30 weeks  Rachel Sellers is at [redacted]w[redacted]d Estimated Date of Delivery: 04/22/24  NST being performed due to pending post dates  Today the NST is Reactive  Fetal Monitoring:  Baseline: 140 bpm, Variability: Good {> 6 bpm), Accelerations: Reactive, and Decelerations: Absent   reactive  The accelerations are >15 bpm and more than 2 in 20 minutes  Final diagnosis:  Reactive NST  Rachel VEAR Inch, MD     Meds: No orders of the defined types were placed in this encounter.  Labs/procedures today: NST  Plan:  IOL 2 days am slot Next visit: prefers na     Follow-up: Return in about 6 weeks (around 06/08/2024) for post partum visit.  Orders Placed This Encounter  Procedures   Culture, beta strep (group b only)    Rachel VEAR Inch, MD 04/27/2024 2:39 PM

## 2024-04-29 ENCOUNTER — Inpatient Hospital Stay (HOSPITAL_COMMUNITY)

## 2024-04-30 LAB — CULTURE, BETA STREP (GROUP B ONLY): Strep Gp B Culture: POSITIVE — AB

## 2024-05-01 ENCOUNTER — Inpatient Hospital Stay (HOSPITAL_COMMUNITY): Admitting: Anesthesiology

## 2024-05-01 ENCOUNTER — Encounter (HOSPITAL_COMMUNITY): Payer: Self-pay | Admitting: Obstetrics and Gynecology

## 2024-05-01 ENCOUNTER — Other Ambulatory Visit: Payer: Self-pay

## 2024-05-01 ENCOUNTER — Inpatient Hospital Stay (HOSPITAL_COMMUNITY)
Admission: AD | Admit: 2024-05-01 | Discharge: 2024-05-05 | DRG: 787 | Disposition: A | Discharge status: Home / Self Care | Payer: Self-pay | Attending: Obstetrics and Gynecology | Admitting: Obstetrics and Gynecology

## 2024-05-01 DIAGNOSIS — D62 Acute posthemorrhagic anemia: Secondary | ICD-10-CM | POA: Diagnosis not present

## 2024-05-01 DIAGNOSIS — O99334 Smoking (tobacco) complicating childbirth: Secondary | ICD-10-CM | POA: Diagnosis present

## 2024-05-01 DIAGNOSIS — O99820 Streptococcus B carrier state complicating pregnancy: Secondary | ICD-10-CM | POA: Diagnosis not present

## 2024-05-01 DIAGNOSIS — O480 Post-term pregnancy: Principal | ICD-10-CM | POA: Diagnosis present

## 2024-05-01 DIAGNOSIS — Z3A41 41 weeks gestation of pregnancy: Secondary | ICD-10-CM

## 2024-05-01 DIAGNOSIS — O9081 Anemia of the puerperium: Secondary | ICD-10-CM | POA: Diagnosis not present

## 2024-05-01 DIAGNOSIS — Z79899 Other long term (current) drug therapy: Secondary | ICD-10-CM

## 2024-05-01 DIAGNOSIS — Z8249 Family history of ischemic heart disease and other diseases of the circulatory system: Secondary | ICD-10-CM | POA: Diagnosis not present

## 2024-05-01 DIAGNOSIS — Z3A00 Weeks of gestation of pregnancy not specified: Secondary | ICD-10-CM | POA: Diagnosis not present

## 2024-05-01 DIAGNOSIS — F419 Anxiety disorder, unspecified: Secondary | ICD-10-CM | POA: Diagnosis present

## 2024-05-01 DIAGNOSIS — Z98891 History of uterine scar from previous surgery: Secondary | ICD-10-CM

## 2024-05-01 DIAGNOSIS — O99824 Streptococcus B carrier state complicating childbirth: Secondary | ICD-10-CM | POA: Diagnosis present

## 2024-05-01 DIAGNOSIS — Z7982 Long term (current) use of aspirin: Secondary | ICD-10-CM | POA: Diagnosis not present

## 2024-05-01 DIAGNOSIS — Z3490 Encounter for supervision of normal pregnancy, unspecified, unspecified trimester: Principal | ICD-10-CM | POA: Diagnosis present

## 2024-05-01 DIAGNOSIS — F17210 Nicotine dependence, cigarettes, uncomplicated: Secondary | ICD-10-CM | POA: Diagnosis present

## 2024-05-01 DIAGNOSIS — F32A Depression, unspecified: Secondary | ICD-10-CM | POA: Diagnosis present

## 2024-05-01 DIAGNOSIS — O99344 Other mental disorders complicating childbirth: Secondary | ICD-10-CM | POA: Diagnosis not present

## 2024-05-01 LAB — CBC
HCT: 31.5 % — ABNORMAL LOW (ref 36.0–46.0)
Hemoglobin: 10.6 g/dL — ABNORMAL LOW (ref 12.0–15.0)
MCH: 30.5 pg (ref 26.0–34.0)
MCHC: 33.7 g/dL (ref 30.0–36.0)
MCV: 90.8 fL (ref 80.0–100.0)
Platelets: 233 K/uL (ref 150–400)
RBC: 3.47 MIL/uL — ABNORMAL LOW (ref 3.87–5.11)
RDW: 13.9 % (ref 11.5–15.5)
WBC: 10.2 K/uL (ref 4.0–10.5)
nRBC: 0 % (ref 0.0–0.2)

## 2024-05-01 LAB — HEMOGLOBIN A1C
Hgb A1c MFr Bld: 5.5 % (ref 4.8–5.6)
Mean Plasma Glucose: 111.15 mg/dL

## 2024-05-01 LAB — TYPE AND SCREEN
ABO/RH(D): O POS
Antibody Screen: NEGATIVE

## 2024-05-01 LAB — CERVICOVAGINAL ANCILLARY ONLY
Chlamydia: NEGATIVE
Comment: NEGATIVE
Comment: NORMAL
Neisseria Gonorrhea: NEGATIVE

## 2024-05-01 LAB — GLUCOSE, CAPILLARY: Glucose-Capillary: 84 mg/dL (ref 70–99)

## 2024-05-01 LAB — CREATININE, SERUM
Creatinine, Ser: 0.82 mg/dL (ref 0.44–1.00)
GFR, Estimated: >60 mL/min (ref >60)

## 2024-05-01 LAB — RPR: RPR Ser Ql: NONREACTIVE

## 2024-05-01 MED ORDER — LIDOCAINE HCL (PF) 1 % IJ SOLN: 30.0000 mL | INTRAMUSCULAR | Status: DC | PRN

## 2024-05-01 MED ORDER — ACETAMINOPHEN 325 MG PO TABS: 650.0000 mg | ORAL_TABLET | ORAL | Status: DC | PRN

## 2024-05-01 MED ORDER — FENTANYL CITRATE (PF) 100 MCG/2ML IJ SOLN
INTRAMUSCULAR | Status: AC
  Filled 2024-05-01: qty 2

## 2024-05-01 MED ORDER — TERBUTALINE SULFATE 1 MG/ML IJ SOLN: 0.2500 mg | Freq: Once | INTRAMUSCULAR | Status: DC | PRN

## 2024-05-01 MED ORDER — BUPIVACAINE HCL (PF) 0.25 % IJ SOLN
INTRAMUSCULAR | Status: DC | PRN
  Administered 2024-05-01: 8 mL via EPIDURAL

## 2024-05-01 MED ORDER — HYDROXYZINE HCL 50 MG PO TABS: 50.0000 mg | ORAL_TABLET | Freq: Four times a day (QID) | ORAL | Status: DC | PRN

## 2024-05-01 MED ORDER — PHENYLEPHRINE 80 MCG/ML (10ML) SYRINGE FOR IV PUSH (FOR BLOOD PRESSURE SUPPORT): 80.0000 ug | PREFILLED_SYRINGE | INTRAVENOUS | Status: DC | PRN

## 2024-05-01 MED ORDER — PHENYLEPHRINE 80 MCG/ML (10ML) SYRINGE FOR IV PUSH (FOR BLOOD PRESSURE SUPPORT)
80.0000 ug | PREFILLED_SYRINGE | INTRAVENOUS | Status: DC | PRN
  Filled 2024-05-01: qty 10

## 2024-05-01 MED ORDER — OXYTOCIN-SODIUM CHLORIDE 30-0.9 UT/500ML-% IV SOLN
1.0000 m[IU]/min | INTRAVENOUS | Status: DC
  Administered 2024-05-01: 2 m[IU]/min via INTRAVENOUS
  Filled 2024-05-01: qty 500

## 2024-05-01 MED ORDER — SERTRALINE HCL 100 MG PO TABS
100.0000 mg | ORAL_TABLET | Freq: Every day | ORAL | Status: DC
  Filled 2024-05-01: qty 1

## 2024-05-01 MED ORDER — NICOTINE 21 MG/24HR TD PT24
21.0000 mg | MEDICATED_PATCH | Freq: Every day | TRANSDERMAL | Status: DC
  Administered 2024-05-01: 21 mg via TRANSDERMAL
  Filled 2024-05-01 (×2): qty 1

## 2024-05-01 MED ORDER — METOCLOPRAMIDE HCL 5 MG/ML IJ SOLN
10.0000 mg | Freq: Once | INTRAMUSCULAR | Status: AC
  Administered 2024-05-01: 10 mg via INTRAVENOUS
  Filled 2024-05-01: qty 2

## 2024-05-01 MED ORDER — MISOPROSTOL 50MCG HALF TABLET: 50.0000 ug | ORAL_TABLET | Freq: Once | ORAL | Status: DC

## 2024-05-01 MED ORDER — MISOPROSTOL 25 MCG QUARTER TABLET: 25.0000 ug | ORAL_TABLET | Freq: Once | ORAL | Status: DC

## 2024-05-01 MED ORDER — EPHEDRINE 5 MG/ML INJ: 10.0000 mg | INTRAVENOUS | Status: DC | PRN

## 2024-05-01 MED ORDER — SODIUM CHLORIDE 0.9% FLUSH: 3.0000 mL | INTRAVENOUS | Status: DC | PRN

## 2024-05-01 MED ORDER — OXYTOCIN-SODIUM CHLORIDE 30-0.9 UT/500ML-% IV SOLN: 2.5000 [IU]/h | INTRAVENOUS | Status: DC

## 2024-05-01 MED ORDER — DIPHENHYDRAMINE HCL 50 MG/ML IJ SOLN: 12.5000 mg | INTRAMUSCULAR | Status: DC | PRN

## 2024-05-01 MED ORDER — SODIUM CHLORIDE 0.9 % IV SOLN
5.0000 10*6.[IU] | Freq: Once | INTRAVENOUS | Status: AC
  Administered 2024-05-01: 5 10*6.[IU] via INTRAVENOUS
  Filled 2024-05-01: qty 5

## 2024-05-01 MED ORDER — LACTATED RINGERS IV SOLN: INTRAVENOUS | Status: DC

## 2024-05-01 MED ORDER — OXYTOCIN BOLUS FROM INFUSION: 333.0000 mL | Freq: Once | INTRAVENOUS | Status: DC

## 2024-05-01 MED ORDER — LACTATED RINGERS IV SOLN: 500.0000 mL | Freq: Once | INTRAVENOUS | Status: DC

## 2024-05-01 MED ORDER — FENTANYL CITRATE (PF) 100 MCG/2ML IJ SOLN
INTRAMUSCULAR | Status: DC | PRN
  Administered 2024-05-01 – 2024-05-02 (×2): 100 ug via EPIDURAL

## 2024-05-01 MED ORDER — OXYCODONE-ACETAMINOPHEN 5-325 MG PO TABS: 2.0000 | ORAL_TABLET | ORAL | Status: DC | PRN

## 2024-05-01 MED ORDER — SODIUM CHLORIDE 0.9 % IV SOLN: 250.0000 mL | INTRAVENOUS | Status: DC | PRN

## 2024-05-01 MED ORDER — NICOTINE 21 MG/24HR TD PT24: 21.0000 mg | MEDICATED_PATCH | Freq: Every day | TRANSDERMAL | Status: DC

## 2024-05-01 MED ORDER — SODIUM CHLORIDE 0.9% FLUSH: 3.0000 mL | Freq: Two times a day (BID) | INTRAVENOUS | Status: DC

## 2024-05-01 MED ORDER — FENTANYL CITRATE (PF) 100 MCG/2ML IJ SOLN: 50.0000 ug | INTRAMUSCULAR | Status: DC | PRN

## 2024-05-01 MED ORDER — LACTATED RINGERS IV SOLN: 500.0000 mL | INTRAVENOUS | Status: DC | PRN

## 2024-05-01 MED ORDER — ONDANSETRON HCL 4 MG/2ML IJ SOLN
4.0000 mg | Freq: Four times a day (QID) | INTRAMUSCULAR | Status: DC | PRN
  Administered 2024-05-01 (×3): 4 mg via INTRAVENOUS
  Filled 2024-05-01 (×3): qty 2

## 2024-05-01 MED ORDER — PENICILLIN G POT IN DEXTROSE 60000 UNIT/ML IV SOLN
3.0000 10*6.[IU] | INTRAVENOUS | Status: DC
  Administered 2024-05-01 (×4): 3 10*6.[IU] via INTRAVENOUS
  Filled 2024-05-01 (×4): qty 50

## 2024-05-01 MED ORDER — FENTANYL-BUPIVACAINE-NACL 0.5-0.125-0.9 MG/250ML-% EP SOLN
12.0000 mL/h | EPIDURAL | Status: DC | PRN
  Administered 2024-05-01 (×2): 12 mL/h via EPIDURAL
  Filled 2024-05-01 (×2): qty 250

## 2024-05-01 MED ORDER — OXYCODONE-ACETAMINOPHEN 5-325 MG PO TABS: 1.0000 | ORAL_TABLET | ORAL | Status: DC | PRN

## 2024-05-01 MED ORDER — SOD CITRATE-CITRIC ACID 500-334 MG/5ML PO SOLN
30.0000 mL | ORAL | Status: DC | PRN
  Administered 2024-05-02: 30 mL via ORAL
  Filled 2024-05-01: qty 30

## 2024-05-01 MED ORDER — LIDOCAINE HCL (PF) 1 % IJ SOLN
INTRAMUSCULAR | Status: DC | PRN
  Administered 2024-05-01 (×2): 4 mL via EPIDURAL

## 2024-05-01 NOTE — Progress Notes (Signed)
 Labor Progress Note Rachel Sellers is a 33 y.o. G1P0000 at [redacted]w[redacted]d presented for 125  S: Comfortable w epidural, no questions.  O:  BP 115/65   Pulse 71   Temp 98.3 F (36.8 C) (Oral)   Resp 18   Wt 87.2 kg   LMP  (LMP Unknown)   SpO2 100%   BMI 34.06 kg/m  EFM: 125/mod/+a/-d  CVE: Dilation: 4 Effacement (%): 80 Station: -3 Presentation: Vertex Exam by:: [name], RN   A&P: 33 y.o. G1P0000 [redacted]w[redacted]d   #Labor: Discussed plan for AROM once adequately tx on PCN. Cont Pitocin . #Pain: Epidural  #FWB: Cat I #GBS positive, on PCN  #Tobacco use: 1ppd use  cont Nicotine  patch  [name], MD 9:42 AM

## 2024-05-01 NOTE — H&P (Signed)
 OBSTETRIC ADMISSION HISTORY AND PHYSICAL  Rachel Sellers is a 33 y.o. female G1P0000 with IUP at [redacted]w[redacted]d by U/S at 10 weeks presenting for IOL for PD . She reports +FMs, No LOF, no VB, no blurry vision, headaches or peripheral edema, and RUQ pain.  She plans on formula feeding. She request vasectomy for birth control. She received her prenatal care at [facility]   Dating: By US  at 10 weeks --->  Estimated Date of Delivery: 04/22/24  Sono:    @[redacted]w[redacted]d , CWD, normal anatomy, cephalic presentation, anterior lie, 2554g, 53% EFW   Prenatal History/Complications:  [facility] for PN care - Lapse in care - T13 elevated on NIPS, normal IT - Anxiety/depression - Zoloft  - 1 PPD smoker - Elevated early GTT  Past Medical History: Past Medical History:  Diagnosis Date   Anemia    Anxiety    Bipolar 1 disorder (HCC)    Depression    History of kidney stones    Hyperemesis 10/16/2023   Admit 2/1-2/2     Hypokalemia 10/16/2023   10/16/23 on admit K+ 2.5, was 3.0 on d/c, rx'd K+ po 40meq daily (didn't start) K+ 3.7 (2/3)     OCD (obsessive compulsive disorder)    PONV (postoperative nausea and vomiting)    Renal disorder    kidney stones   Vaginal Pap smear, abnormal     Past Surgical History: Past Surgical History:  Procedure Laterality Date   ANAL FISTULOTOMY N/A 07/31/2022   Procedure: EXAM UNDER ANESTHESIA WITH SETON PLACEMENT;  Surgeon: Kallie Manuelita BROCKS, MD;  Location: [facility];  Service: General;  Laterality: N/A;   BIOPSY  12/22/2022   Procedure: BIOPSY;  Surgeon: Cindie Carlin POUR, DO;  Location: [facility];  Service: Endoscopy;;   CERVICAL CONIZATION W/BX Left 08/23/2019   Procedure: LASER ABLATION OF CERVIX;  Surgeon: Jayne Vonn DEL, MD;  Location: [facility];  Service: Gynecology;  Laterality: Left;   COLONOSCOPY WITH PROPOFOL  N/A 12/22/2022   Procedure: COLONOSCOPY WITH PROPOFOL ;  Surgeon: Cindie Carlin POUR, DO;  Location: [facility];  Service: Endoscopy;  Laterality: N/A;   2:00 pm   INCISION AND DRAINAGE ABSCESS Left 10/30/2015   Procedure: INCISION AND DRAINAGE PERI-RECTAL ABSCESS;  Surgeon: Oneil Budge, MD;  Location: [facility];  Service: General;  Laterality: Left;   INCISION AND DRAINAGE ABSCESS N/A 11/09/2022   Procedure: INCISION AND DRAINAGE PERIRECTAL ABSCESS;  Surgeon: Evonnie Dorothyann LABOR, DO;  Location: [facility];  Service: General;  Laterality: N/A;   INCISION AND DRAINAGE PERIRECTAL ABSCESS N/A 06/05/2022   Procedure: IRRIGATION AND DEBRIDEMENT PERIRECTAL ABSCESS; PENROSE DRAIN PLACED;  Surgeon: Kallie Manuelita BROCKS, MD;  Location: [facility];  Service: General;  Laterality: N/A;   LIGATION OF INTERNAL FISTULA TRACT N/A 03/05/2023   Procedure: LIGATION OF INTERNAL FISTULA TRACT;  Surgeon: Debby Hila, MD;  Location: [facility];  Service: General;  Laterality: N/A;   MULTIPLE TOOTH EXTRACTIONS  05/22/2022    Obstetrical History: OB History     Gravida  1   Para  0   Term  0   Preterm  0   AB  0   Living  0      SAB  0   IAB  0   Ectopic  0   Multiple  0   Live Births  0           Social History Social History   Socioeconomic History   Marital status: Media planner  Spouse name: [name]   Number of children: Not on file   Years of education: 12   Highest education level: 12th grade  Occupational History   Not on file  Tobacco Use   Smoking status: Every Day    Current packs/day: 1.00    Average packs/day: 1 pack/day for 14.0 years (13.5 ttl pk-yrs)    Types: Cigarettes   Smokeless tobacco: Never   Tobacco comments:    Considering quitting, open to getting information re: quitting  Vaping Use   Vaping status: Former   Substances: THC, CBD   Devices: tried once or twice  Substance and Sexual Activity   Alcohol use: Not Currently    Comment: occassionally   Drug use: Not Currently    Types: Marijuana, Fentanyl     Comment: Fentanyl  for early contractions previous admission ~ 2 weeks ago    Sexual activity: Not Currently    Partners: Male    Birth control/protection: None    Comment: Partner planning vasectomy  Other Topics Concern   Not on file  Social History Narrative   Not on file   Social Drivers of Health   Financial Resource Strain: Low Risk  (04/09/2024)   Overall Financial Resource Strain (CARDIA)    Difficulty of Paying Living Expenses: Not very hard  Food Insecurity: No Food Insecurity (05/01/2024)   Hunger Vital Sign    Worried About Running Out of Food in the Last Year: Never true    Ran Out of Food in the Last Year: Never true  Transportation Needs: No Transportation Needs (05/01/2024)   PRAPARE - Administrator, Civil Service (Medical): No    Lack of Transportation (Non-Medical): No  Physical Activity: Inactive (04/09/2024)   Exercise Vital Sign    Days of Exercise per Week: 0 days    Minutes of Exercise per Session: Not on file  Stress: Stress Concern Present (04/09/2024)   [employer] of Occupational Health - Occupational Stress Questionnaire    Feeling of Stress: Very much  Social Connections: Moderately Isolated (04/09/2024)   Social Connection and Isolation Panel    Frequency of Communication with Friends and Family: More than three times a week    Frequency of Social Gatherings with Friends and Family: Once a week    Attends Religious Services: 1 to 4 times per year    Active Member of [employer] or Organizations: No    Attends Engineer, structural: Not on file    Marital Status: Never married    Family History: Family History  Problem Relation Age of Onset   Depression Mother    Anxiety disorder Mother    Depression Sister    Hypertension Maternal Grandmother    Heart attack Maternal Great-grandfather     Allergies: Allergies  Allergen Reactions   Gentamicin Other (See Comments)    Renal issues.   Vancomycin  Other (See Comments)    Kidney failure    Medications Prior to Admission  Medication Sig Dispense  Refill Last Dose/Taking   aspirin  EC 81 MG tablet Take 1 tablet (81 mg total) by mouth daily. Swallow whole. 90 tablet 3 04/30/2024   Ferrous Sulfate  (IRON  PO) Take by mouth.   Past Week   ondansetron  (ZOFRAN -ODT) 4 MG disintegrating tablet Take 1 tablet (4 mg total) by mouth every 8 (eight) hours as needed. 20 tablet 0 Past Month   prenatal vitamin w/FE, FA (PRENATAL 1 + 1) 27-1 MG TABS tablet Take 1 tablet by mouth daily  at 12 noon.   Past Week   sertraline  (ZOLOFT ) 100 MG tablet Take 1 tablet (100 mg total) by mouth daily. 90 tablet 3 Past Week     Review of Systems   All systems reviewed and negative except as stated in HPI  Blood pressure 113/72, pulse 89, temperature 98.6 F (37 C), temperature source Oral, resp. rate 18, weight 87.2 kg, SpO2 99%, unknown if currently breastfeeding. General appearance: alert, cooperative, and appears stated age Lungs: clear to auscultation bilaterally Heart: regular rate and rhythm Abdomen: soft, non-tender; bowel sounds normal Pelvic: adequate Extremities: Homans sign is negative, no sign of DVT DTR's +2 Presentation: cephalic Fetal monitoringBaseline: 135 bpm, Variability: Good {> 6 bpm), Accelerations: Reactive, and Decelerations: Variable: mild Uterine activityFrequency: Every 5-7 minutes, Duration: 60-120 seconds, and Intensity: mild Dilation: 3 Effacement (%): 80 Station: -3 Exam by:: Camie Lowers- HIll, CNM   Prenatal labs: ABO, Rh: --/--/O POS (08/18 0114) Antibody: NEG (08/18 0114) Rubella: 2.95 (02/03 1512) RPR: Non Reactive (05/30 0907)  HBsAg: Negative (02/03 1512)  HIV: Non Reactive (05/30 0907)  GBS: Positive/-- (08/14 1613)    Lab Results  Component Value Date   GBS Positive (A) 04/27/2024   GTT early GTT elevated at 5.7, lapse in care Genetic screening  pos T13 NIPT, declined further testing, MFM US  WNL Anatomy US  WNL  Immunization History  Administered Date(s) Administered   Tdap 10/09/2022    Prenatal Transfer  Tool  Maternal Diabetes: Elevated early HbgA1c, missed further screening Genetic Screening: Abnormal:  Results: Other: elevated T13 Maternal Ultrasounds/Referrals: Other: MFM for T13 Fetal Ultrasounds or other Referrals:  Referred to Materal Fetal Medicine  Maternal Substance Abuse:  No Significant Maternal Medications:  Meds include: Zoloft  Significant Maternal Lab Results: Group B Strep positive Number of Prenatal Visits:greater than 3 verified prenatal visits Maternal Vaccinations:none Other Comments:  Lapse in care   Results for orders placed or performed during the hospital encounter of 05/01/24 (from the past 24 hours)  Type and screen [facility]   Collection Time: 05/01/24  1:14 AM  Result Value Ref Range   ABO/RH(D) O POS    Antibody Screen NEG    Sample Expiration      05/04/2024,2359 Performed at [facility], [address], [place], [place] [address]   CBC   Collection Time: 05/01/24  1:39 AM  Result Value Ref Range   WBC 10.2 4.0 - 10.5 K/uL   RBC 3.47 (L) 3.87 - 5.11 MIL/uL   Hemoglobin 10.6 (L) 12.0 - 15.0 g/dL   HCT 68.4 (L) 63.9 - 53.9 %   MCV 90.8 80.0 - 100.0 fL   MCH 30.5 26.0 - 34.0 pg   MCHC 33.7 30.0 - 36.0 g/dL   RDW 86.0 88.4 - 84.4 %   Platelets 233 150 - 400 K/uL   nRBC 0.0 0.0 - 0.2 %    Patient Active Problem List   Diagnosis Date Noted   Encounter for induction of labor 05/01/2024   De Quervain's tenosynovitis 04/10/2024   Anemia in pregnancy 02/25/2024   Abnormal chromosomal and genetic finding on antenatal screening mother 11/03/2023   Abnormal Pap smear of cervix 10/18/2023   Smoker 10/18/2023   Supervision of normal first pregnancy 10/14/2023   Anxiety and depression 09/20/2022   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 G1P0000 at [redacted]w[redacted]d here for IOL for postdates  #Labor:IOL in latent phase. 3/80/-3 plan pitocin  until  adequate GBS prophylaxis achieved,  then AROM.  #Pain: Coping well, medication or epidural on request.  #FWB: Cat 2, overall reassuring #GBS status:  positive #Feeding: Formula #Reproductive Life planning: Vasectomy #Circ:  yes # T13: positive on NIPT, US  WNL, will need neonatal testing # Early elevated HgbA1c: Plan repeat HgbA1c here with CBG POCT.  # 1 PPD smoker: 21mg  nicotine  patch while admitted #Dep/anxiety: Zoloft  100mg  daily continedu  Camie DELENA Rote, CNM  05/01/2024, 3:03 AM

## 2024-05-01 NOTE — Anesthesia Procedure Notes (Signed)
 Epidural Patient location during procedure: OB Start time: 05/01/2024 4:24 AM End time: 05/01/2024 4:29 AM  Staffing Anesthesiologist: Peggye Delon Brunswick, MD Performed: anesthesiologist   Preanesthetic Checklist Completed: patient identified, IV checked, site marked, risks and benefits discussed, surgical consent, monitors and equipment checked, pre-op evaluation and timeout performed  Epidural Patient position: sitting Prep: DuraPrep and site prepped and draped Patient monitoring: continuous pulse ox and blood pressure Approach: midline Location: L3-L4 Injection technique: LOR saline  Needle:  Needle type: Tuohy  Needle gauge: 17 G Needle length: 9 cm and 9 Needle insertion depth: 5 cm Catheter type: closed end flexible Catheter size: 19 Gauge Catheter at skin depth: 9 cm Test dose: negative  Assessment Events: blood not aspirated, no cerebrospinal fluid, injection not painful, no injection resistance, no paresthesia and negative IV test  Additional Notes The patient has requested an epidural for labor pain management. Risks and benefits including, but not limited to, infection, bleeding, local anesthetic toxicity, headache, hypotension, back pain, block failure, etc. were discussed with the patient. The patient expressed understanding and consented to the procedure. I confirmed that the patient has no bleeding disorders and is not taking blood thinners. I confirmed the patient's last platelet count with the nurse. A time-out was performed immediately prior to the procedure. Please see nursing documentation for vital signs. Sterile technique was used throughout the whole procedure. Once LOR achieved, the epidural catheter threaded easily without resistance. Aspiration of the catheter was negative for blood and CSF. The epidural was dosed slowly and an infusion was started.  1 attempt(s)Reason for block:procedure for pain

## 2024-05-01 NOTE — Anesthesia Preprocedure Evaluation (Signed)
 Anesthesia Evaluation  Patient identified by MRN, date of birth, ID band Patient awake    Reviewed: Allergy & Precautions, NPO status , Patient's Chart, lab work & pertinent test results  History of Anesthesia Complications (+) PONV and history of anesthetic complications  Airway Mallampati: III  TM Distance: >3 FB Neck ROM: Full    Dental   Pulmonary Current Smoker   Pulmonary exam normal breath sounds clear to auscultation       Cardiovascular negative cardio ROS  Rhythm:Regular Rate:Normal     Neuro/Psych  PSYCHIATRIC DISORDERS (OCD) Anxiety Depression Bipolar Disorder   negative neurological ROS     GI/Hepatic Neg liver ROS,neg GERD  ,,H/o fistula   Endo/Other  negative endocrine ROS    Renal/GU negative Renal ROS     Musculoskeletal   Abdominal   Peds  Hematology  (+) Blood dyscrasia, anemia Lab Results      Component                Value               Date                      WBC                      10.2                05/01/2024                HGB                      10.6 (L)            05/01/2024                HCT                      31.5 (L)            05/01/2024                MCV                      90.8                05/01/2024                PLT                      233                 05/01/2024              Anesthesia Other Findings Takes baby aspirin   Reproductive/Obstetrics (+) Pregnancy                              Anesthesia Physical Anesthesia Plan  ASA: 2  Anesthesia Plan: Epidural   Post-op Pain Management:    Induction:   PONV Risk Score and Plan:   Airway Management Planned: Natural Airway  Additional Equipment:   Intra-op Plan:   Post-operative Plan:   Informed Consent: I have reviewed the patients History and Physical, chart, labs and discussed the procedure including the risks, benefits and alternatives for the proposed anesthesia with  the patient or authorized representative who has indicated his/her understanding and acceptance.  Plan Discussed with: Anesthesiologist  Anesthesia Plan Comments: (I have discussed risks of neuraxial anesthesia including but not limited to infection, bleeding, nerve injury, back pain, headache, seizures, and failure of block. Patient denies bleeding disorders and is not currently anticoagulated. Labs have been reviewed. Risks and benefits discussed. All patient's questions answered.  )         Anesthesia Quick Evaluation

## 2024-05-01 NOTE — Progress Notes (Signed)
 Labor Progress Note EDDIE KOC is a 33 y.o. G1P0000 at [redacted]w[redacted]d presented for 125  S: Doing well.   O:  BP 123/75   Pulse 76   Temp 97.6 F (36.4 C) (Axillary)   Resp 18   Wt 87.2 kg   LMP  (LMP Unknown)   SpO2 100%   BMI 34.06 kg/m  EFM: 130/mod/+a/-d  CVE: Dilation: 4.5 Effacement (%): 90 Station: -3 Presentation: Vertex Exam by:: Dr. Von   A&P: 33 y.o. G1P0000 [redacted]w[redacted]d   #Labor: Discussed role of AROM for IOL. Pt provided verbal consent and AROM performed w small amount of clear/bloody fluid. IUPC placed w pt consent to allow for better titration of Pitocin . #Pain: Epidural  #FWB: Cat I #GBS positive, on PCN  #Tobacco use: 1ppd use  cont Nicotine  patch  Alain Von, MD 12:34 PM

## 2024-05-02 ENCOUNTER — Encounter (HOSPITAL_COMMUNITY)
Admission: AD | Disposition: A | Discharge status: Home / Self Care | Payer: Self-pay | Source: Home / Self Care | Attending: Obstetrics and Gynecology

## 2024-05-02 ENCOUNTER — Other Ambulatory Visit: Payer: Self-pay

## 2024-05-02 ENCOUNTER — Encounter (HOSPITAL_COMMUNITY): Payer: Self-pay | Admitting: Obstetrics and Gynecology

## 2024-05-02 DIAGNOSIS — Z3A41 41 weeks gestation of pregnancy: Secondary | ICD-10-CM

## 2024-05-02 DIAGNOSIS — O480 Post-term pregnancy: Secondary | ICD-10-CM | POA: Diagnosis not present

## 2024-05-02 DIAGNOSIS — O99344 Other mental disorders complicating childbirth: Secondary | ICD-10-CM | POA: Diagnosis not present

## 2024-05-02 DIAGNOSIS — O99820 Streptococcus B carrier state complicating pregnancy: Secondary | ICD-10-CM | POA: Diagnosis not present

## 2024-05-02 LAB — CBC
HCT: 29.9 % — ABNORMAL LOW (ref 36.0–46.0)
Hemoglobin: 10.6 g/dL — ABNORMAL LOW (ref 12.0–15.0)
MCH: 31.4 pg (ref 26.0–34.0)
MCHC: 35.5 g/dL (ref 30.0–36.0)
MCV: 88.5 fL (ref 80.0–100.0)
Platelets: 192 K/uL (ref 150–400)
RBC: 3.38 MIL/uL — ABNORMAL LOW (ref 3.87–5.11)
RDW: 13.4 % (ref 11.5–15.5)
WBC: 23.9 K/uL — ABNORMAL HIGH (ref 4.0–10.5)
nRBC: 0 % (ref 0.0–0.2)

## 2024-05-02 LAB — CREATININE, SERUM
Creatinine, Ser: 1.09 mg/dL — ABNORMAL HIGH (ref 0.44–1.00)
GFR, Estimated: >60 mL/min (ref >60)

## 2024-05-02 SURGERY — Surgical Case
Anesthesia: Epidural

## 2024-05-02 MED ORDER — SOD CITRATE-CITRIC ACID 500-334 MG/5ML PO SOLN: 30.0000 mL | ORAL | Status: DC

## 2024-05-02 MED ORDER — SIMETHICONE 80 MG PO CHEW: 80.0000 mg | CHEWABLE_TABLET | ORAL | Status: DC | PRN

## 2024-05-02 MED ORDER — ONDANSETRON HCL 4 MG/2ML IJ SOLN
4.0000 mg | Freq: Three times a day (TID) | INTRAMUSCULAR | Status: DC | PRN
  Administered 2024-05-02: 4 mg via INTRAVENOUS
  Filled 2024-05-02: qty 2

## 2024-05-02 MED ORDER — DROPERIDOL 2.5 MG/ML IJ SOLN: 0.6250 mg | Freq: Once | INTRAMUSCULAR | Status: DC | PRN

## 2024-05-02 MED ORDER — ACETAMINOPHEN 500 MG PO TABS
1000.0000 mg | ORAL_TABLET | Freq: Four times a day (QID) | ORAL | Status: DC
  Administered 2024-05-02 – 2024-05-05 (×12): 1000 mg via ORAL
  Filled 2024-05-02 (×13): qty 2

## 2024-05-02 MED ORDER — OXYCODONE HCL 5 MG PO TABS
5.0000 mg | ORAL_TABLET | ORAL | Status: DC | PRN
  Administered 2024-05-03: 5 mg via ORAL
  Administered 2024-05-03 – 2024-05-04 (×3): 10 mg via ORAL
  Filled 2024-05-02 (×2): qty 2
  Filled 2024-05-02: qty 1
  Filled 2024-05-02: qty 2

## 2024-05-02 MED ORDER — SODIUM CHLORIDE 0.9 % IR SOLN
Status: DC | PRN
  Administered 2024-05-02: 1

## 2024-05-02 MED ORDER — SIMETHICONE 80 MG PO CHEW
80.0000 mg | CHEWABLE_TABLET | Freq: Three times a day (TID) | ORAL | Status: DC
  Administered 2024-05-02 – 2024-05-05 (×8): 80 mg via ORAL
  Filled 2024-05-02 (×8): qty 1

## 2024-05-02 MED ORDER — TETANUS-DIPHTH-ACELL PERTUSSIS 5-2.5-18.5 LF-MCG/0.5 IM SUSY: 0.5000 mL | PREFILLED_SYRINGE | Freq: Once | INTRAMUSCULAR | Status: DC

## 2024-05-02 MED ORDER — NALOXONE HCL 0.4 MG/ML IJ SOLN: 0.4000 mg | INTRAMUSCULAR | Status: DC | PRN

## 2024-05-02 MED ORDER — ACETAMINOPHEN 10 MG/ML IV SOLN
INTRAVENOUS | Status: DC | PRN
  Administered 2024-05-02: 1000 mg via INTRAVENOUS

## 2024-05-02 MED ORDER — KETOROLAC TROMETHAMINE 30 MG/ML IJ SOLN: 30.0000 mg | Freq: Four times a day (QID) | INTRAMUSCULAR | Status: DC | PRN

## 2024-05-02 MED ORDER — MORPHINE SULFATE (PF) 0.5 MG/ML IJ SOLN
INTRAMUSCULAR | Status: DC | PRN
  Administered 2024-05-02: 3 mg via EPIDURAL

## 2024-05-02 MED ORDER — LIDOCAINE-EPINEPHRINE (PF) 2 %-1:200000 IJ SOLN
INTRAMUSCULAR | Status: DC | PRN
  Administered 2024-05-02: 5 mL via EPIDURAL
  Administered 2024-05-02: 6 mL via EPIDURAL
  Administered 2024-05-02: 5 mL via EPIDURAL

## 2024-05-02 MED ORDER — LACTATED RINGERS IV BOLUS
1000.0000 mL | Freq: Once | INTRAVENOUS | Status: AC
  Administered 2024-05-02: 1000 mL via INTRAVENOUS

## 2024-05-02 MED ORDER — ZOLPIDEM TARTRATE 5 MG PO TABS: 5.0000 mg | ORAL_TABLET | Freq: Every evening | ORAL | Status: DC | PRN

## 2024-05-02 MED ORDER — ACETAMINOPHEN 500 MG PO TABS: 1000.0000 mg | ORAL_TABLET | Freq: Four times a day (QID) | ORAL | Status: DC

## 2024-05-02 MED ORDER — OXYTOCIN-SODIUM CHLORIDE 30-0.9 UT/500ML-% IV SOLN: 2.5000 [IU]/h | INTRAVENOUS | Status: AC

## 2024-05-02 MED ORDER — MENTHOL 3 MG MT LOZG: 1.0000 | LOZENGE | OROMUCOSAL | Status: DC | PRN

## 2024-05-02 MED ORDER — DIPHENHYDRAMINE HCL 50 MG/ML IJ SOLN: 12.5000 mg | INTRAMUSCULAR | Status: DC | PRN

## 2024-05-02 MED ORDER — METHYLERGONOVINE MALEATE 0.2 MG/ML IJ SOLN
INTRAMUSCULAR | Status: DC | PRN
  Administered 2024-05-02: .2 mg via INTRAMUSCULAR

## 2024-05-02 MED ORDER — PROMETHAZINE HCL 25 MG/ML IJ SOLN
25.0000 mg | Freq: Four times a day (QID) | INTRAMUSCULAR | Status: DC | PRN
  Filled 2024-05-02: qty 1

## 2024-05-02 MED ORDER — DEXAMETHASONE SODIUM PHOSPHATE 10 MG/ML IJ SOLN
INTRAMUSCULAR | Status: DC | PRN
  Administered 2024-05-02: 10 mg via INTRAVENOUS

## 2024-05-02 MED ORDER — TRANEXAMIC ACID-NACL 1000-0.7 MG/100ML-% IV SOLN
INTRAVENOUS | Status: DC | PRN
  Administered 2024-05-02: 1000 mg via INTRAVENOUS

## 2024-05-02 MED ORDER — HYDROMORPHONE HCL 1 MG/ML IJ SOLN: 0.2000 mg | INTRAMUSCULAR | Status: DC | PRN

## 2024-05-02 MED ORDER — FENTANYL CITRATE (PF) 100 MCG/2ML IJ SOLN
INTRAMUSCULAR | Status: AC
  Filled 2024-05-02: qty 2

## 2024-05-02 MED ORDER — SCOPOLAMINE 1 MG/3DAYS TD PT72
1.0000 | MEDICATED_PATCH | TRANSDERMAL | Status: DC
  Administered 2024-05-02: 1.5 mg via TRANSDERMAL
  Filled 2024-05-02: qty 1

## 2024-05-02 MED ORDER — BUPIVACAINE HCL (PF) 0.5 % IJ SOLN: 30.0000 mL | Freq: Once | INTRAMUSCULAR | Status: DC

## 2024-05-02 MED ORDER — SERTRALINE HCL 100 MG PO TABS: 100.0000 mg | ORAL_TABLET | Freq: Every day | ORAL | Status: DC

## 2024-05-02 MED ORDER — PRENATAL MULTIVITAMIN CH
1.0000 | ORAL_TABLET | Freq: Every day | ORAL | Status: DC
  Administered 2024-05-03 – 2024-05-05 (×3): 1 via ORAL
  Filled 2024-05-02 (×3): qty 1

## 2024-05-02 MED ORDER — IBUPROFEN 600 MG PO TABS
600.0000 mg | ORAL_TABLET | Freq: Four times a day (QID) | ORAL | Status: DC
  Administered 2024-05-03 – 2024-05-05 (×10): 600 mg via ORAL
  Filled 2024-05-02 (×10): qty 1

## 2024-05-02 MED ORDER — SODIUM CHLORIDE 0.9 % IV SOLN
500.0000 mg | INTRAVENOUS | Status: AC
  Administered 2024-05-02: 500 mg via INTRAVENOUS

## 2024-05-02 MED ORDER — OXYTOCIN-SODIUM CHLORIDE 30-0.9 UT/500ML-% IV SOLN
INTRAVENOUS | Status: DC | PRN
  Administered 2024-05-02: 30 [IU] via INTRAVENOUS

## 2024-05-02 MED ORDER — FENTANYL CITRATE (PF) 100 MCG/2ML IJ SOLN: 25.0000 ug | INTRAMUSCULAR | Status: DC | PRN

## 2024-05-02 MED ORDER — KETOROLAC TROMETHAMINE 30 MG/ML IJ SOLN
30.0000 mg | Freq: Four times a day (QID) | INTRAMUSCULAR | Status: AC
  Administered 2024-05-02 (×4): 30 mg via INTRAVENOUS
  Filled 2024-05-02 (×3): qty 1

## 2024-05-02 MED ORDER — NALOXONE HCL 4 MG/10ML IJ SOLN: 1.0000 ug/kg/h | INTRAVENOUS | Status: DC | PRN

## 2024-05-02 MED ORDER — DIPHENHYDRAMINE HCL 25 MG PO CAPS: 25.0000 mg | ORAL_CAPSULE | ORAL | Status: DC | PRN

## 2024-05-02 MED ORDER — SODIUM CHLORIDE 0.9% FLUSH: 3.0000 mL | INTRAVENOUS | Status: DC | PRN

## 2024-05-02 MED ORDER — BUPIVACAINE HCL (PF) 0.5 % IJ SOLN
INTRAMUSCULAR | Status: AC
  Filled 2024-05-02: qty 30

## 2024-05-02 MED ORDER — MORPHINE SULFATE (PF) 0.5 MG/ML IJ SOLN
INTRAMUSCULAR | Status: AC
  Filled 2024-05-02: qty 10

## 2024-05-02 MED ORDER — WITCH HAZEL-GLYCERIN EX PADS: 1.0000 | MEDICATED_PAD | CUTANEOUS | Status: DC | PRN

## 2024-05-02 MED ORDER — STERILE WATER FOR IRRIGATION IR SOLN
Status: DC | PRN
  Administered 2024-05-02: 1000 mL

## 2024-05-02 MED ORDER — CEFAZOLIN SODIUM-DEXTROSE 2-4 GM/100ML-% IV SOLN
INTRAVENOUS | Status: AC
  Filled 2024-05-02: qty 100

## 2024-05-02 MED ORDER — SCOPOLAMINE 1 MG/3DAYS TD PT72: 1.0000 | MEDICATED_PATCH | TRANSDERMAL | Status: DC

## 2024-05-02 MED ORDER — LACTATED RINGERS IV SOLN: INTRAVENOUS | Status: AC

## 2024-05-02 MED ORDER — GABAPENTIN 100 MG PO CAPS
100.0000 mg | ORAL_CAPSULE | Freq: Two times a day (BID) | ORAL | Status: DC
  Administered 2024-05-02 – 2024-05-05 (×6): 100 mg via ORAL
  Filled 2024-05-02 (×7): qty 1

## 2024-05-02 MED ORDER — SENNOSIDES-DOCUSATE SODIUM 8.6-50 MG PO TABS
2.0000 | ORAL_TABLET | Freq: Every day | ORAL | Status: DC
  Administered 2024-05-03 – 2024-05-05 (×2): 2 via ORAL
  Filled 2024-05-02 (×2): qty 2

## 2024-05-02 MED ORDER — CEFAZOLIN SODIUM-DEXTROSE 2-4 GM/100ML-% IV SOLN
2.0000 g | INTRAVENOUS | Status: AC
  Administered 2024-05-02: 2 g via INTRAVENOUS

## 2024-05-02 MED ORDER — METOCLOPRAMIDE HCL 5 MG/5ML PO SOLN
10.0000 mg | Freq: Four times a day (QID) | ORAL | Status: DC | PRN
  Administered 2024-05-02: 10 mg via ORAL
  Filled 2024-05-02 (×2): qty 10

## 2024-05-02 MED ORDER — DIBUCAINE (PERIANAL) 1 % EX OINT
1.0000 | TOPICAL_OINTMENT | CUTANEOUS | Status: DC | PRN
Start: 2024-05-02 — End: 2024-05-05

## 2024-05-02 MED ORDER — COCONUT OIL OIL: 1.0000 | TOPICAL_OIL | Status: DC | PRN

## 2024-05-02 MED ORDER — NICOTINE 21 MG/24HR TD PT24
21.0000 mg | MEDICATED_PATCH | Freq: Every day | TRANSDERMAL | Status: DC
  Administered 2024-05-02 – 2024-05-05 (×2): 21 mg via TRANSDERMAL
  Filled 2024-05-02 (×4): qty 1

## 2024-05-02 MED ORDER — ENOXAPARIN SODIUM 40 MG/0.4ML IJ SOSY
40.0000 mg | PREFILLED_SYRINGE | INTRAMUSCULAR | Status: DC
  Administered 2024-05-03 – 2024-05-05 (×3): 40 mg via SUBCUTANEOUS
  Filled 2024-05-02 (×3): qty 0.4

## 2024-05-02 MED ORDER — PHENYLEPHRINE 80 MCG/ML (10ML) SYRINGE FOR IV PUSH (FOR BLOOD PRESSURE SUPPORT)
PREFILLED_SYRINGE | INTRAVENOUS | Status: DC | PRN
  Administered 2024-05-02 (×6): 80 ug via INTRAVENOUS

## 2024-05-02 MED ORDER — BUPIVACAINE HCL 0.25 % IJ SOLN: 30.0000 mL | Freq: Once | INTRAMUSCULAR | Status: DC

## 2024-05-02 MED ORDER — DIPHENHYDRAMINE HCL 25 MG PO CAPS: 25.0000 mg | ORAL_CAPSULE | Freq: Four times a day (QID) | ORAL | Status: DC | PRN

## 2024-05-02 MED ORDER — KETOROLAC TROMETHAMINE 30 MG/ML IJ SOLN
30.0000 mg | Freq: Once | INTRAMUSCULAR | Status: DC
  Filled 2024-05-02: qty 1

## 2024-05-02 MED ORDER — ONDANSETRON HCL 4 MG/2ML IJ SOLN
INTRAMUSCULAR | Status: DC | PRN
  Administered 2024-05-02: 4 mg via INTRAVENOUS

## 2024-05-02 SURGICAL SUPPLY — 30 items
CHLORAPREP W/TINT 26 (MISCELLANEOUS) ×2 IMPLANT
CLAMP UMBILICAL CORD (MISCELLANEOUS) ×1 IMPLANT
CLOTH BEACON ORANGE TIMEOUT ST (SAFETY) ×1 IMPLANT
DERMABOND ADVANCED .7 DNX12 (GAUZE/BANDAGES/DRESSINGS) IMPLANT
DRSG OPSITE POSTOP 4X10 (GAUZE/BANDAGES/DRESSINGS) ×1 IMPLANT
ELECTRODE REM PT RTRN 9FT ADLT (ELECTROSURGICAL) ×1 IMPLANT
EXTRACTOR VACUUM KIWI (MISCELLANEOUS) IMPLANT
GAUZE PAD ABD 8X10 STRL (GAUZE/BANDAGES/DRESSINGS) IMPLANT
GAUZE SPONGE 4X4 12PLY STRL LF (GAUZE/BANDAGES/DRESSINGS) IMPLANT
GLOVE BIO SURGEON STRL SZ 6 (GLOVE) ×1 IMPLANT
GLOVE BIOGEL PI IND STRL 7.0 (GLOVE) ×2 IMPLANT
GOWN STRL REUS W/TWL LRG LVL3 (GOWN DISPOSABLE) ×2 IMPLANT
KIT ABG SYR 3ML LUER SLIP (SYRINGE) IMPLANT
NDL HYPO 25X5/8 SAFETYGLIDE (NEEDLE) IMPLANT
NEEDLE HYPO 22GX1.5 SAFETY (NEEDLE) IMPLANT
NEEDLE HYPO 25X5/8 SAFETYGLIDE (NEEDLE) IMPLANT
NS IRRIG 1000ML POUR BTL (IV SOLUTION) ×1 IMPLANT
PACK C SECTION WH (CUSTOM PROCEDURE TRAY) ×1 IMPLANT
PAD OB MATERNITY 4.3X12.25 (PERSONAL CARE ITEMS) ×1 IMPLANT
RETRACTOR WND ALEXIS 25 LRG (MISCELLANEOUS) IMPLANT
SUT MON AB 4-0 PS1 27 (SUTURE) ×1 IMPLANT
SUT PLAIN 0 NONE (SUTURE) ×1 IMPLANT
SUT VIC AB 0 CT1 27XBRD ANBCTR (SUTURE) IMPLANT
SUT VIC AB 0 CT1 36 (SUTURE) ×1 IMPLANT
SUT VIC AB 0 CTX36XBRD ANBCTRL (SUTURE) ×2 IMPLANT
SUT VIC AB 2-0 CT1 TAPERPNT 27 (SUTURE) ×1 IMPLANT
SYR CONTROL 10ML LL (SYRINGE) IMPLANT
TOWEL OR 17X24 6PK STRL BLUE (TOWEL DISPOSABLE) ×1 IMPLANT
TRAY FOLEY W/BAG SLVR 14FR LF (SET/KITS/TRAYS/PACK) IMPLANT
WATER STERILE IRR 1000ML POUR (IV SOLUTION) ×1 IMPLANT

## 2024-05-02 NOTE — Progress Notes (Signed)
 Attempted to complete orthostatic vitals, patient became nauseated and vomited. Return to bed and made comfortable, encouraged sleep, and getting some food when she wakes up.

## 2024-05-02 NOTE — Anesthesia Postprocedure Evaluation (Signed)
 Anesthesia Post Note  Patient: Rachel Sellers  Procedure(s) Performed: CESAREAN DELIVERY     Patient location during evaluation: PACU Anesthesia Type: Epidural Level of consciousness: awake and alert Pain management: pain level controlled Vital Signs Assessment: post-procedure vital signs reviewed and stable Respiratory status: spontaneous breathing, nonlabored ventilation and respiratory function stable Cardiovascular status: blood pressure returned to baseline Postop Assessment: epidural receding, no apparent nausea or vomiting, no headache and no backache Anesthetic complications: no   No notable events documented.            Vertell Row

## 2024-05-02 NOTE — Progress Notes (Signed)
 Patient has been pushing for 2 hours and has maternal exhaustion. Head is OT. Offered re-dose of epidural and then manual rotation. Patient declines. She only wants a c-section.   The risks of surgery were discussed with the patient including but were not limited to: bleeding which may require transfusion or reoperation; infection which may require antibiotics; injury to bowel, bladder, ureters or other surrounding organs; injury to the fetus; need for additional procedures including hysterectomy in the event of a life-threatening hemorrhage; formation of adhesions; placental abnormalities with subsequent pregnancies; incisional problems; thromboembolic phenomenon and other postoperative/anesthesia complications.    The patient concurred with the proposed plan, giving informed written consent for the procedure.  She will remain NPO for procedure. Anesthesia and OR aware. Preoperative prophylactic antibiotics and SCDs ordered on call to the OR.  To OR when ready.  Vina Solian, MD Attending Obstetrician & Gynecologist, [facility] for [facility], [facility]

## 2024-05-02 NOTE — Progress Notes (Signed)
 Comfortable w/epidural. VSS.  Cx now 7/100/-2.  FHR Cat 1.  Ctx q 2-4 minutes. IUPC placed.  Pitocin  at 10 mu/min.  Continue present mgt.

## 2024-05-02 NOTE — Op Note (Signed)
 Rachel Sellers PROCEDURE DATE: 05/02/2024  PREOPERATIVE DIAGNOSES: Intrauterine pregnancy at [redacted]w[redacted]d weeks gestation; failure to progress: arrest of descent  POSTOPERATIVE DIAGNOSES: The same  PROCEDURE: Primary Low Transverse Cesarean Section  SURGEON:  Dr. Vina Solian  ASSISTANT:  Dr. Mardy Shropshire. An experienced assistant was required given the standard of surgical care given the complexity of the case.  This assistant was needed for exposure, dissection, suctioning, retraction, instrument exchange, and for overall help during the procedure.   ANESTHESIOLOGY TEAM: Anesthesiologist: Peggye Delon Brunswick, MD; Merla Almarie HERO, DO; Paul Lamarr BRAVO, MD; Boone Fess, MD  INDICATIONS: Rachel Sellers is a 33 y.o. G1P0000 at [redacted]w[redacted]d here for cesarean section secondary to the indications listed under preoperative diagnoses; please see preoperative note for further details.  The risks of surgery were discussed with the patient including but were not limited to: bleeding which may require transfusion or reoperation; infection which may require antibiotics; injury to bowel, bladder, ureters or other surrounding organs; injury to the fetus; need for additional procedures including hysterectomy in the event of a life-threatening hemorrhage; formation of adhesions; placental abnormalities wth subsequent pregnancies; incisional problems; thromboembolic phenomenon and other postoperative/anesthesia complications.  The patient concurred with the proposed plan, giving informed written consent for the procedure.    FINDINGS:  Viable female infant in cephalic presentation.  Apgars not yet reported. PPV given in room, only able to collect venous cord gases but this was sent.  Amniotic fluid: thick meconium.  Intact placenta, three vessel cord.  Normal uterus, fallopian tubes and ovaries bilaterally.  ANESTHESIA: epidural, 30 cc of local 0.5% bupivacaine  without epinephrine  INTRAVENOUS FLUIDS: 1800 ml    ESTIMATED BLOOD LOSS: 1120 ml URINE OUTPUT:  300 ml SPECIMENS: Placenta sent to pathology  COMPLICATIONS: None immediate  PROCEDURE IN DETAIL:  The patient preoperatively received intravenous antibiotics and had sequential compression devices applied to her lower extremities.  She was then taken to the operating room where the epidural was dosed up to a surgical level and found to be adequate. She was then placed in a dorsal supine position with a leftward tilt, and prepped and draped in a sterile manner.  A foley catheter was  placed into her bladder and attached to constant gravity.  After an adequate timeout was performed, a Pfannenstiel skin incision was made with scalpel and carried through to the underlying layer of fascia. The fascia was incised in the midline, and this incision was extended bluntly. The rectus muscles were separated in the midline and the peritoneum was entered bluntly.   The Alexis self-retaining retractor was introduced into the abdominal cavity.  Attention was turned to the lower uterine segment where a low transverse hysterotomy was made with a scalpel and extended bilaterally bluntly.  The infant was successfully delivered, the cord was clamped and cut within one minute due to minimal cry effort and the copious meconium, and the infant was handed over to the awaiting nurse - NICU was notified as well and came promptly to the OR. Uterine massage was then administered, and the placenta delivered intact with a three-vessel cord. The uterus was exteriorized. The uterus was then cleared of clots and debris.  Tone was poor so TXA and Methergine  were given. The hysterotomy was closed with 0 Vicryl in a running fashion. There was an extension on the right lateral side of the uterus that was repaired with 0 Vicryl up to the main hysterotomy.  Tone was noted to be improved after returning the uterus to  the abdomen.   The pelvis was cleared of all clot and debris. Hemostasis was  confirmed on all surfaces. The pelvis was irrigated due to the meconium.   The retractor was removed.  The peritoneum was closed with a 2-0 Vicryl running stitch. The fascia was then closed using 0 Vicryl in a running fashion.  The subcutaneous layer was irrigated, was reapproximated with 2-0 plain gut in a running fashion. She noted some stinging in her incision so 0.5% bupivacaine  without epinephrine  was injected into the incision to provide local anesthesia. The skin was closed with a 4-0 monocryl subcuticular stitch. The patient tolerated the procedure well. Sponge, instrument and needle counts were correct x 3.  She was taken to the recovery room in stable condition.    Vina Solian, MD Attending Obstetrician & Gynecologist, [facility] for [facility], [facility]

## 2024-05-02 NOTE — Transfer of Care (Signed)
 Immediate Anesthesia Transfer of Care Note  Patient: Rachel Sellers  Procedure(s) Performed: CESAREAN DELIVERY  Patient Location: PACU  Anesthesia Type:Epidural  Level of Consciousness: awake, alert , and oriented  Airway & Oxygen Therapy: Patient Spontanous Breathing  Post-op Assessment: Report given to RN and Post -op Vital signs reviewed and stable  Post vital signs: Reviewed and stable  Last Vitals:  Vitals Value Taken Time  BP 129/94 05/02/24 04:46  Temp    Pulse 86 05/02/24 04:53  Resp 20 05/02/24 04:53  SpO2 99 % 05/02/24 04:53  Vitals shown include unfiled device data.  Last Pain:  Vitals:   05/02/24 0200  TempSrc: Axillary  PainSc:          Complications: No notable events documented.

## 2024-05-02 NOTE — Discharge Summary (Addendum)
 Postpartum Discharge Summary  Date of Service updated***     Patient Name: Rachel Sellers DOB: 1991-06-27 MRN: [id number]  Date of admission: 05/01/2024 Delivery date:05/02/2024 Delivering provider: CLEATUS MOCCASIN Date of discharge: 05/02/2024  Admitting diagnosis: Encounter for induction of labor [Z34.90] Intrauterine pregnancy: [redacted]w[redacted]d     Secondary diagnosis:  Principal Problem:   Encounter for induction of labor  Additional problems: ***    Discharge diagnosis: {DX.:23714}                                              Post partum procedures:{Postpartum procedures:23558} Augmentation: AROM and Pitocin  Complications: {OB Labor/Delivery Complications:20784}  Hospital course: Induction of Labor With Cesarean Section   33 y.o. yo G1P0000 at [redacted]w[redacted]d was admitted to the hospital 05/01/2024 for induction of labor. Patient had a labor course significant for arrest of descent. She pushed for two hours and was exhausted and reported she could not continue. She requested a c-section. The patient went for cesarean section due to Arrest of Descent. Delivery details are as follows: Membrane Rupture Time/Date: 12:08 PM,05/01/2024  Delivery Method:C-Section, Low Transverse Operative Delivery:N/A Details of operation can be found in separate operative Note.  Patient had a postpartum course complicated by***. She is ambulating, tolerating a regular diet, passing flatus, and urinating well.  Patient is discharged home in stable condition on 05/02/24.      Newborn Data: Birth date:05/02/2024 Birth time:3:39 AM Gender:Female Living status:Living Apgars:3 ,8  Weight:3240 g                               Magnesium  Sulfate received: No BMZ received: No Rhophylac:N/A MMR:N/A T-DaP:{Tdap:23962} Flu: N/A RSV Vaccine received: No Transfusion:{Transfusion received:30440034}  Immunizations received: Immunization History  Administered Date(s) Administered   Tdap 10/09/2022    Physical exam   Vitals:   05/02/24 0103 05/02/24 0130 05/02/24 0200 05/02/24 0230  BP: (!) 145/78 137/69 136/80 138/82  Pulse: 85 80 92 81  Resp:      Temp:   98.3 F (36.8 C)   TempSrc:   Axillary   SpO2: 100% 99% 100% 98%  Weight:       General: {Exam; general:21111117} Lochia: {Desc; appropriate/inappropriate:30686::appropriate} Uterine Fundus: {Desc; firm/soft:30687} Incision: {Exam; incision:21111123} DVT Evaluation: {Exam; dvt:2111122} Labs: Lab Results  Component Value Date   WBC 10.2 05/01/2024   HGB 10.6 (L) 05/01/2024   HCT 31.5 (L) 05/01/2024   MCV 90.8 05/01/2024   PLT 233 05/01/2024      Latest Ref Rng & Units 05/01/2024    1:39 AM  CMP  Creatinine 0.44 - 1.00 mg/dL 9.17    Edinburgh Score:     No data to display         No data recorded  After visit meds:  Allergies as of 05/02/2024       Reactions   Gentamicin Other (See Comments)   Renal issues.   Vancomycin  Other (See Comments)   Kidney failure     Med Rec must be completed prior to using this [facility]***        Discharge home in stable condition Infant Feeding: Bottle Infant Disposition:{CHL IP OB HOME WITH FNUYZM:76418} Discharge instruction: per After Visit Summary and Postpartum booklet. Activity: Advance as tolerated. Pelvic rest for 6 weeks.  Diet: routine diet Future Appointments:  Future Appointments  Date Time Provider Department Center  06/08/2024  9:50 AM Ozan, Jennifer, DO [facility]  10/11/2024  1:10 PM Cook, Jayce G, DO [facility]   Follow up Visit:  Message sent to FT on 05/02/24 by Dr. Cleatus.    Please schedule this patient for a In person postpartum visit in 4- 6 weeks with the following provider: Any provider. Additional Postpartum F/U:Incision check 1 week  Low risk pregnancy complicated by: NA Delivery mode:  C-Section, Low Transverse Anticipated Birth Control:  Vasectomy   05/02/2024 Vina Cleatus, MD

## 2024-05-03 LAB — CBC
HCT: 24.2 % — ABNORMAL LOW (ref 36.0–46.0)
Hemoglobin: 8.3 g/dL — ABNORMAL LOW (ref 12.0–15.0)
MCH: 30.6 pg (ref 26.0–34.0)
MCHC: 34.3 g/dL (ref 30.0–36.0)
MCV: 89.3 fL (ref 80.0–100.0)
Platelets: 211 K/uL (ref 150–400)
RBC: 2.71 MIL/uL — ABNORMAL LOW (ref 3.87–5.11)
RDW: 14.2 % (ref 11.5–15.5)
WBC: 15.7 K/uL — ABNORMAL HIGH (ref 4.0–10.5)
nRBC: 0 % (ref 0.0–0.2)

## 2024-05-03 LAB — SURGICAL PATHOLOGY

## 2024-05-03 NOTE — Patient Instructions (Signed)
 If interested in an outpatient lactation consult in office or virtually please reach out to us  at [facility] (First Floor) [address], [place]  Please call [phone] and press 4 for lactation.    Lactation support groups:  [facility], Tuesdays 10:00 am -12:00 pm at [address] on the second floor in the conference room, lactating parents and lap babies welcome.  [email or web address]  [email or web address]   Geraldina Louder, [facility] for [facility]

## 2024-05-03 NOTE — Progress Notes (Signed)
 POSTPARTUM PROGRESS NOTE  POD #1  Subjective:  Rachel Sellers is a 33 y.o. G1P1001 s/p pLTCS at [redacted]w[redacted]d. No acute events overnight. She reports she is doing well. She denies any problems with ambulating, voiding or po intake. Denies nausea or vomiting. She has  passed flatus. Pain is well controlled.  Lochia is appropriate.  Objective: Blood pressure 117/68, pulse 64, temperature 98.4 F (36.9 C), temperature source Oral, resp. rate 16, weight 87.2 kg, SpO2 100%, unknown if currently breastfeeding.  Physical Exam:  General: alert, cooperative and no distress Chest: no respiratory distress Heart: regular rate Uterine Fundus: firm, appropriately tender Extremities: No edema Skin: warm, dry; incision clean/dry/intact w/ pressure dressing in place  Recent Labs    05/01/24 0139 05/02/24 0740  HGB 10.6* 10.6*  HCT 31.5* 29.9*    Assessment/Plan: Rachel Sellers is a 33 y.o. G1P1001 s/p pLTCS at [redacted]w[redacted]d for AoDes.  POD#1 - Doing welll; pain adequately controlled  Routine postpartum care  OOB, ambulated  Lovenox  for VTE prophylaxis PPH Anemia Hgb 10.6 on admit, EBL 1120 - continue po iron  - repeat CBC ordered  Tob use - 1ppd - monitor wound healing  Contraception: vasectomy Feeding: formula  Dispo: Plan for discharge POD2.   LOS: 2 days   Barabara Maier, DO OB Fellow  05/03/2024, 10:55 AM

## 2024-05-04 ENCOUNTER — Telehealth: Payer: Self-pay

## 2024-05-04 ENCOUNTER — Other Ambulatory Visit: Payer: Self-pay

## 2024-05-04 ENCOUNTER — Other Ambulatory Visit (HOSPITAL_COMMUNITY): Payer: Self-pay

## 2024-05-04 DIAGNOSIS — Z98891 History of uterine scar from previous surgery: Secondary | ICD-10-CM

## 2024-05-04 DIAGNOSIS — O9081 Anemia of the puerperium: Secondary | ICD-10-CM | POA: Insufficient documentation

## 2024-05-04 DIAGNOSIS — D509 Iron deficiency anemia, unspecified: Secondary | ICD-10-CM | POA: Insufficient documentation

## 2024-05-04 LAB — CBC WITH DIFFERENTIAL/PLATELET
Abs Immature Granulocytes: 0.04 K/uL (ref 0.00–0.07)
Basophils Absolute: 0 K/uL (ref 0.0–0.1)
Basophils Relative: 0 %
Eosinophils Absolute: 0.1 K/uL (ref 0.0–0.5)
Eosinophils Relative: 1 %
HCT: 22.9 % — ABNORMAL LOW (ref 36.0–46.0)
Hemoglobin: 7.8 g/dL — ABNORMAL LOW (ref 12.0–15.0)
Immature Granulocytes: 0 %
Lymphocytes Relative: 22 %
Lymphs Abs: 2.7 K/uL (ref 0.7–4.0)
MCH: 30.7 pg (ref 26.0–34.0)
MCHC: 34.1 g/dL (ref 30.0–36.0)
MCV: 90.2 fL (ref 80.0–100.0)
Monocytes Absolute: 0.7 K/uL (ref 0.1–1.0)
Monocytes Relative: 6 %
Neutro Abs: 9 K/uL — ABNORMAL HIGH (ref 1.7–7.7)
Neutrophils Relative %: 71 %
Platelets: 221 K/uL (ref 150–400)
RBC: 2.54 MIL/uL — ABNORMAL LOW (ref 3.87–5.11)
RDW: 14.2 % (ref 11.5–15.5)
WBC: 12.6 K/uL — ABNORMAL HIGH (ref 4.0–10.5)
nRBC: 0 % (ref 0.0–0.2)

## 2024-05-04 MED ORDER — FERROUS SULFATE 325 (65 FE) MG PO TABS
325.0000 mg | ORAL_TABLET | Freq: Every day | ORAL | 0 refills | Status: AC
  Filled 2024-05-04: qty 30, 30d supply, fill #0

## 2024-05-04 MED ORDER — OXYCODONE HCL 5 MG PO TABS
5.0000 mg | ORAL_TABLET | ORAL | 0 refills | Status: DC | PRN
  Filled 2024-05-04: qty 20, 4d supply, fill #0

## 2024-05-04 MED ORDER — ACETAMINOPHEN 500 MG PO TABS
1000.0000 mg | ORAL_TABLET | Freq: Four times a day (QID) | ORAL | 0 refills | Status: AC
  Filled 2024-05-04: qty 60, 8d supply, fill #0

## 2024-05-04 MED ORDER — FERROUS SULFATE 325 (65 FE) MG PO TABS
325.0000 mg | ORAL_TABLET | Freq: Every day | ORAL | Status: DC
  Administered 2024-05-04 – 2024-05-05 (×2): 325 mg via ORAL
  Filled 2024-05-04 (×2): qty 1

## 2024-05-04 MED ORDER — IBUPROFEN 600 MG PO TABS
600.0000 mg | ORAL_TABLET | Freq: Four times a day (QID) | ORAL | 0 refills | Status: AC
  Filled 2024-05-04: qty 30, 8d supply, fill #0

## 2024-05-04 NOTE — Telephone Encounter (Signed)
 Hello,  Patient will be scheduled as soon as possible.  Auth Submission: NO AUTH NEEDED Site of care: Site of care: [facility] Payer: bcbs ppo Medication & CPT/J Code(s) submitted: Venofer  (Iron  Sucrose) J1756 Diagnosis Code:  Route of submission (phone, fax, portal): phone Phone # Fax # Auth type: Buy/Bill PB Units/visits requested: 200mg  x 5doses Reference number: [id number] Approval from: 05/04/24 to 09/03/24

## 2024-05-04 NOTE — Clinical Social Work Maternal (Signed)
 CLINICAL SOCIAL WORK MATERNAL/CHILD NOTE   Patient Details  Name: Rachel Sellers MRN: [id number] Date of Birth: 11/14/1990   Date:  2024-01-08   Clinical Social Worker Initiating Note:  Eliazar Teresea Donley      Date/Time: Initiated:  04-29-2024/1546      Child's Name:  Rachel Sellers October 13, 2024    Biological Parents:  Mother, Father Rachel Sellers 08/10/1990, Rachel Sellers 10/23/1987)    Need for Interpreter:  None    Reason for Referral:  Behavioral Health Concerns, Current Substance Use/Substance Use During Pregnancy      Address:  [address] [place] [address]    Phone number:  [phone] (home)      Additional phone number:    Household Members/Support Persons (HM/SP):   Household Member/Support Person 1     HM/SP Name Relationship DOB or Age  HM/SP -1 Rachel Sellers FOB 10/23/1987  HM/SP -2     HM/SP -3     HM/SP -4     HM/SP -5     HM/SP -6     HM/SP -7     HM/SP -8         Natural Supports (not living in the home):  Parent    Professional Supports: None    Employment: Full-time    Type of Work:      Education:  Some Economist arranged:     Architect:  Media planner     Other Resources:  [facility]    Cultural/Religious Considerations Which May Impact Care:     Strengths:  Ability to meet basic needs  , Home prepared for child  , Pediatrician chosen    Psychotropic Medications:          Pediatrician:    [place]   Pediatrician List:    [place]   [facility]  [facility]       Pediatrician Fax Number:     Risk Factors/Current Problems:  Substance Use      Cognitive State:  Able to Concentrate  , Alert      Mood/Affect:  Calm  , Comfortable      CSW Assessment: CSW received a consult for MOB hx of anxiety, depression bipolar and THC use during pregnancy. CSW met with MOB to complete assessment and offer support. CSW entered the room  and observed MOB walking around the room. CSW introduced self, CSW role and reason for visit, MOB was agreeable to visit. CSW inquired about how MOB was feeling, MOB reported good. CSW confirmed MOB demographic information, MOB verified the information on file was correct. CSW inquired about MOB MH hx, MOB reported she was she was diagnosed with Bipolar at age 4 and reported she deals with anxiety and depression, MOB feels Bipolar was a misdiagnosis as she feels her symptoms do not meet the criteria for Bipolar. MOB reported she has talked to other providers told her she does not meet criteria to Bipolar. MOB reported she does take Zoloft for Anxiety and Depression and reported she was involved in therapy in the past but not currently. CSW inquired about MOB's mood during pregnancy, MOB reported a stable mood. CSW assessed for safety, MOB denied any SI or HI. CSW provided education regarding the baby blues period vs. perinatal mood disorders, discussed treatment and gave resources for mental health follow up if concerns arise.  CSW recommends self-evaluation during the postpartum  time period using the New Mom Checklist from Postpartum Progress and encouraged MOB to contact a medical professional if symptoms are noted at any time.  MOB identified FOB, her mom and his mom as her supports.   CSW inquired about MOB noted THC use, MOB reported she used THC help with nausea and to have an appetite. MBO reported her last use was about a week ago and she only used as needed. CSW informed MOB of hospital drug screen policy, MOB verbalized understanding. CSW informed MOB infant UDS was positive for [facility] and CDS was pending, MOB verbalized understanding. CSW denies any other substance use. CSW made CPS report to [facility] due to infant positive UDS results.    CSW provided review of Sudden Infant Death Syndrome (SIDS) precautions. MOB identified [facility] for infants follow up care. MOB reported she has all  essential items for the infant including a bassinet and car seat.  CSW identifies no further need for intervention and no barriers to discharge at this time.   CSW Plan/Description:  No Further Intervention Required/No Barriers to Discharge, Sudden Infant Death Syndrome (SIDS) Education, Perinatal Mood and Anxiety Disorder (PMADs) Education, CSW Will Continue to Monitor Umbilical Cord Tissue Drug Screen Results and Make Report if Warranted, Child Protective Service Report  , Hospital Drug Screen Policy Information      Eliazar CHRISTELLA Gave, [place] 07/09/2024, 11:59 AM

## 2024-05-05 ENCOUNTER — Encounter (HOSPITAL_COMMUNITY): Payer: Self-pay | Admitting: Obstetrics and Gynecology

## 2024-05-05 ENCOUNTER — Other Ambulatory Visit (HOSPITAL_COMMUNITY): Payer: Self-pay

## 2024-05-05 MED ORDER — SODIUM CHLORIDE 0.9 % IV SOLN
500.0000 mg | Freq: Once | INTRAVENOUS | Status: DC
  Filled 2024-05-05: qty 25

## 2024-05-05 MED ORDER — EPINEPHRINE 0.3 MG/0.3ML IJ SOAJ: 0.3000 mg | Freq: Once | INTRAMUSCULAR | Status: DC | PRN

## 2024-05-05 MED ORDER — METHYLPREDNISOLONE SODIUM SUCC 125 MG IJ SOLR: 125.0000 mg | Freq: Once | INTRAMUSCULAR | Status: DC | PRN

## 2024-05-05 MED ORDER — ALBUTEROL SULFATE (2.5 MG/3ML) 0.083% IN NEBU: 2.5000 mg | INHALATION_SOLUTION | Freq: Once | RESPIRATORY_TRACT | Status: DC | PRN

## 2024-05-05 MED ORDER — DIPHENHYDRAMINE HCL 50 MG/ML IJ SOLN: 25.0000 mg | Freq: Once | INTRAMUSCULAR | Status: DC | PRN

## 2024-05-05 MED ORDER — SODIUM CHLORIDE 0.9 % IV BOLUS: 500.0000 mL | Freq: Once | INTRAVENOUS | Status: DC | PRN

## 2024-05-05 MED ORDER — SODIUM CHLORIDE 0.9 % IV SOLN: INTRAVENOUS | Status: DC | PRN

## 2024-05-05 NOTE — Progress Notes (Addendum)
 LABOR PROGRESS NOTE  Patient Name: Rachel Sellers, female   DOB: 1991-07-03, 33 y.o.  MRN: [id number]  Prepared for discharge 8/21 but stayed due to inability to receive meds to beds to facilitate rooming in with newborn.   Plan for discharge today.  Mardy Shropshire, MD

## 2024-05-05 NOTE — Discharge Summary (Signed)
 Postpartum Discharge Summary  Date of Service updated 05/05/2024     Patient Name: Rachel Sellers DOB: May 08, 1991 MRN: [id number]  Date of admission: 05/01/2024 Delivery date:05/02/2024 Delivering provider: CLEATUS MOCCASIN Date of discharge: 05/05/2024  Admitting diagnosis: Encounter for induction of labor [Z34.90] Intrauterine pregnancy: [redacted]w[redacted]d     Secondary diagnosis:  Active Problems:   Status post primary low transverse cesarean section  Additional problems: anemia in pregnancy, anxiety/depression, NIPT T13+                     Discharge diagnosis: Term Pregnancy Delivered , s/p primary low transverse cesarean                                             Post partum procedures:none Augmentation: AROM and Pitocin  Complications:    Hospital course: Induction of Labor With Cesarean Section   33 y.o. yo G1P1001 at [redacted]w[redacted]d was admitted to the hospital 05/01/2024 for induction of labor. Patient had a labor course significant for arrest of descent. She pushed for two hours and was exhausted and reported she could not continue. She requested a c-section. The patient went for cesarean section due to Arrest of Descent. Delivery details are as follows: Membrane Rupture Time/Date: 12:08 PM,05/01/2024  Delivery Method:C-Section, Low Transverse Operative Delivery:N/A Details of operation can be found in separate operative Note.  Patient had a postpartum course complicated by nausea/vomiting. She is ambulating, tolerating a regular diet, passing flatus, and urinating well.  Patient is discharged home in stable condition on 05/04/24.       Newborn Data: Birth date:05/02/2024 Birth time:3:39 AM Gender:Female Living status:Living Apgars:3 ,8  Weight:3240 g                                 Magnesium  Sulfate received: No BMZ received: No Rhophylac:N/A MMR:N/A T-DaP:Given prenatally Flu: N/A RSV Vaccine received: No Transfusion:No  Immunizations received: Immunization History  Administered  Date(s) Administered   Tdap 10/09/2022    Physical exam  Vitals:   05/04/24 0518 05/04/24 1617 05/04/24 1930 05/05/24 0517  BP: 134/76 132/68 127/79 128/77  Pulse: 75 83 81 68  Resp: 16 17 16 18   Temp: 97.9 F (36.6 C) 98.7 F (37.1 C) 98 F (36.7 C) 97.7 F (36.5 C)  TempSrc:  Oral Oral Oral  SpO2:  100% 99% 100%  Weight:       General: alert, cooperative, and no distress Lochia: appropriate Uterine Fundus: firm Incision: Healing well with no significant drainage, No significant erythema, Dressing is clean, dry, and intact DVT Evaluation: No evidence of DVT seen on physical exam. Negative Homan's sign. No cords or calf tenderness. Labs: Lab Results  Component Value Date   WBC 12.6 (H) 05/04/2024   HGB 7.8 (L) 05/04/2024   HCT 22.9 (L) 05/04/2024   MCV 90.2 05/04/2024   PLT 221 05/04/2024      Latest Ref Rng & Units 05/02/2024    7:40 AM  CMP  Creatinine 0.44 - 1.00 mg/dL 8.90    Edinburgh Score:    05/03/2024    1:01 PM  Edinburgh Postnatal Depression Scale Screening Tool  I have been able to laugh and see the funny side of things. 0  I have looked forward with enjoyment to things. 0  I have  blamed myself unnecessarily when things went wrong. 1  I have been anxious or worried for no good reason. 2  I have felt scared or panicky for no good reason. 2  Things have been getting on top of me. 1  I have been so unhappy that I have had difficulty sleeping. 0  I have felt sad or miserable. 0  I have been so unhappy that I have been crying. 0  The thought of harming myself has occurred to me. 0  Edinburgh Postnatal Depression Scale Total 6   No data recorded  After visit meds:  Allergies as of 05/05/2024       Reactions   Gentamicin Other (See Comments)   Renal issues.   Vancomycin  Other (See Comments)   Kidney failure        Medication List     STOP taking these medications    aspirin  EC 81 MG tablet   ondansetron  4 MG disintegrating  tablet Commonly known as: ZOFRAN -ODT       TAKE these medications    Acetaminophen  Extra Strength 500 MG Tabs Take 2 tablets (1,000 mg total) by mouth every 6 (six) hours.   FeroSul 325 (65 Fe) MG tablet Generic drug: ferrous sulfate  Take 1 tablet (325 mg total) by mouth daily with breakfast. What changed:  medication strength how much to take when to take this   ibuprofen  600 MG tablet Commonly known as: ADVIL  Take 1 tablet (600 mg total) by mouth every 6 (six) hours.   oxyCODONE  5 MG immediate release tablet Commonly known as: Oxy IR/ROXICODONE  Take 1 tablet (5 mg total) by mouth every 4 (four) hours as needed for moderate pain (pain score 4-6).   prenatal vitamin w/FE, FA 27-1 MG Tabs tablet Take 1 tablet by mouth daily at 12 noon.   sertraline  100 MG tablet Commonly known as: ZOLOFT  Take 1 tablet (100 mg total) by mouth daily.         Discharge home in stable condition Infant Feeding: Bottle Infant Disposition:rooming in Discharge instruction: per After Visit Summary and Postpartum booklet. Activity: Advance as tolerated. Pelvic rest for 6 weeks.  Diet: routine diet Future Appointments: Future Appointments  Date Time Provider Department Center  05/09/2024 10:10 AM Delores Nidia CROME, FNP [facility]  06/08/2024  9:50 AM Ozan, Jennifer, DO [facility]  10/11/2024  1:10 PM Cook, Jayce G, DO [facility]   Follow up Visit:  Follow-up Information     [facility] for [facility] at [facility]. Schedule an appointment as soon as possible for a visit in 1 week(s).   Specialty: Obstetrics and Gynecology Why: Postpartum incision check in 1 week Contact information: [address] [place]  [address] [phone]        [facility] for [facility] at [facility]. Schedule an appointment as soon as possible for a visit in 4 week(s).   Specialty: Obstetrics and Gynecology Why: Routine postpartum visit in  4-6 weeks Contact information: [address] [place]  [address] [phone]               Message to FT 8/22  Please schedule this patient for a In person postpartum visit in 4 weeks with the following provider: Any provider. Additional Postpartum F/U:Incision check 1 week  Low risk pregnancy complicated by: GAD and MDD Delivery mode:  C-Section, Low Transverse Anticipated Birth Control:  Vasectomy   05/05/2024 Mardy Shropshire, MD

## 2024-05-09 ENCOUNTER — Encounter: Payer: Self-pay | Admitting: Obstetrics and Gynecology

## 2024-05-09 ENCOUNTER — Ambulatory Visit (INDEPENDENT_AMBULATORY_CARE_PROVIDER_SITE_OTHER): Admitting: Obstetrics and Gynecology

## 2024-05-09 DIAGNOSIS — F419 Anxiety disorder, unspecified: Secondary | ICD-10-CM | POA: Diagnosis not present

## 2024-05-09 DIAGNOSIS — Z48816 Encounter for surgical aftercare following surgery on the genitourinary system: Secondary | ICD-10-CM | POA: Diagnosis not present

## 2024-05-09 DIAGNOSIS — F32A Depression, unspecified: Secondary | ICD-10-CM | POA: Diagnosis not present

## 2024-05-09 DIAGNOSIS — D509 Iron deficiency anemia, unspecified: Secondary | ICD-10-CM

## 2024-05-09 DIAGNOSIS — Z5189 Encounter for other specified aftercare: Secondary | ICD-10-CM

## 2024-05-09 MED ORDER — BUSPIRONE HCL 7.5 MG PO TABS: 7.5000 mg | ORAL_TABLET | Freq: Two times a day (BID) | ORAL | 2 refills | Status: DC

## 2024-05-09 NOTE — Progress Notes (Signed)
 GYN VISIT Patient name: Rachel Sellers MRN [id number]  Date of birth: [birth year] Chief Complaint:   Wound Check  History of Present Illness:   MODELL FENDRICK is a 33 y.o. G45P1001  female being seen today for wound check following primary c-section on 05/02/24. Reports healing well since c-section but thinks she might possibly be doing too much activity/housework. Also states that she isn't sure what to expect pain wise following her c-section. She has noticed an increase in anxiety since having the baby.     No LMP recorded (lmp unknown). The current method of family planning is vasectomy.  Last pap Jan 2024. Results were: NILM w/ HRHPV negative     04/10/2024    8:47 AM 10/18/2023    2:21 PM 01/04/2023   10:43 AM 11/13/2022    4:17 PM 10/09/2022    1:20 PM  Depression screen PHQ 2/9  Decreased Interest 1 1 1 1 2   Down, Depressed, Hopeless 1 0 0 2 1  PHQ - 2 Score 2 1 1 3 3   Altered sleeping 3 3  2 1   Tired, decreased energy 3 3  2 1   Change in appetite 1 3  3 1   Feeling bad or failure about yourself  0 0  1 1  Trouble concentrating 3 0  3 3  Moving slowly or fidgety/restless 2 0  3 3  Suicidal thoughts 0 0  0 0  PHQ-9 Score 14 10  17 13   Difficult doing work/chores Very difficult   Somewhat difficult Very difficult        04/10/2024    8:47 AM 10/18/2023    2:22 PM 11/13/2022    4:17 PM 10/09/2022    1:21 PM  GAD 7 : Generalized Anxiety Score  Nervous, Anxious, on Edge 3 1 3 3   Control/stop worrying 3 1 3 3   Worry too much - different things 3 1 3 3   Trouble relaxing 3 1 3 3   Restless 2 1 3 3   Easily annoyed or irritable 2 1 3 3   Afraid - awful might happen 2 1 3 3   Total GAD 7 Score 18 7 21 21   Anxiety Difficulty Very difficult  Extremely difficult Extremely difficult     Review of Systems:   Pertinent items are noted in HPI Denies fever/chills, dizziness, headaches, visual disturbances, fatigue, shortness of breath, chest pain, abdominal pain, vomiting, abnormal  vaginal discharge/itching/odor/irritation, problems with periods, bowel movements, urination, or intercourse unless otherwise stated above.  Pertinent History Reviewed:  Reviewed past medical,surgical, social, obstetrical and family history.  Reviewed problem list, medications and allergies. Physical Assessment:   Vitals:   05/09/24 1018  BP: 133/81  Pulse: 92  Weight: 77.7 kg  Height: 5' 3 (1.6 m)  Body mass index is 30.33 kg/m.       Physical Examination:   General appearance: alert, well appearing, and in no distress  Mental status: alert, oriented to person, place, and time  Skin: warm & dry   Cardiovascular: normal heart rate noted  Respiratory: normal respiratory effort, no distress  Abdomen: soft, non-tender   Wound: edges well approximated, healing well, no signs of infection  Pelvic: examination not indicated  Extremities: no edema    Assessment & Plan:  1. Postpartum care following cesarean delivery (Primary) Primary C-Section 8/19. Discussed expected pain post op & management.  2. Anxiety and depression History of anxiety & depression. Currently taking Zoloft  100mg . Prior to pregnancy her PCP prescribed prn klonopin   for anxiety. Reports feeling fine depression wise but is having significant anxiety since discharge home. Discussed klonopin  isn't recommended for anxiety management long term but more for prn occasions such as flying in an airplane. Discussed addition of Buspar  for support and referral to psychiatry for further management.   - busPIRone  (BUSPAR ) 7.5 MG tablet; Take 1 tablet (7.5 mg total) by mouth 2 (two) times daily.  Dispense: 60 tablet; Refill: 2  3. Visit for wound check Incision healing well. Reviewed wound care and S&S of infection   Meds:  Meds ordered this encounter  Medications   busPIRone  (BUSPAR ) 7.5 MG tablet    Sig: Take 1 tablet (7.5 mg total) by mouth 2 (two) times daily.    Dispense:  60 tablet    Refill:  2    Orders Placed  This Encounter  Procedures   Ambulatory referral to Psychiatry   Will follow up on 9/25 for pp visit.   Elenor Mole, [facility] 05/09/24

## 2024-06-08 ENCOUNTER — Other Ambulatory Visit (HOSPITAL_COMMUNITY)
Admission: RE | Admit: 2024-06-08 | Discharge: 2024-06-08 | Disposition: A | Discharge status: Home / Self Care | Source: Ambulatory Visit | Attending: Obstetrics & Gynecology | Admitting: Obstetrics & Gynecology

## 2024-06-08 ENCOUNTER — Encounter: Payer: Self-pay | Admitting: Women's Health

## 2024-06-08 ENCOUNTER — Ambulatory Visit (INDEPENDENT_AMBULATORY_CARE_PROVIDER_SITE_OTHER): Admitting: Women's Health

## 2024-06-08 DIAGNOSIS — Z124 Encounter for screening for malignant neoplasm of cervix: Secondary | ICD-10-CM

## 2024-06-08 DIAGNOSIS — F418 Other specified anxiety disorders: Secondary | ICD-10-CM

## 2024-06-08 DIAGNOSIS — Z48816 Encounter for surgical aftercare following surgery on the genitourinary system: Secondary | ICD-10-CM | POA: Diagnosis not present

## 2024-06-08 DIAGNOSIS — Z3009 Encounter for other general counseling and advice on contraception: Secondary | ICD-10-CM

## 2024-06-08 MED ORDER — HYDROXYZINE HCL 10 MG PO TABS: 10.0000 mg | ORAL_TABLET | Freq: Three times a day (TID) | ORAL | 0 refills | Status: AC | PRN

## 2024-06-08 NOTE — Progress Notes (Signed)
 POSTPARTUM VISIT Patient name: Rachel Sellers MRN [id number]  Date of birth: Sep 20, 1990 Chief Complaint:   Postpartum Care (Discuss BTL process)  History of Present Illness:   Rachel Sellers is a 33 y.o. G57P1001 Caucasian female being seen today for a postpartum visit. She is 5 weeks postpartum following a primary cesarean section, low transverse incision at 41.3 gestational weeks d/t FTP/AOD. IOL: yes, for postdates. Anesthesia: epidural.  Laceration: n/a.  Complications: none. Inpatient contraception: no.   Pregnancy uncomplicated. Tobacco use: yes. Substance use disorder: no. Last pap smear: 10/09/22 and results were NILM w/ HRHPV positive: type not specified. Next pap smear due: now No LMP recorded (lmp unknown).  Postpartum course has been uncomplicated. Bleeding none. Bowel function is normal. Bladder function is normal. Urinary incontinence? no, fecal incontinence? no Patient is not sexually active. Last sexual activity: prior to birth of baby. Desired contraception: wants BTS. Patient does not want a pregnancy in the future.  Desired family size is 1 children.   Upstream - 06/08/24 0958       Pregnancy Intention Screening   Does the patient want to become pregnant in the next year? No    Does the patient's partner want to become pregnant in the next year? No    Would the patient like to discuss contraceptive options today? Yes      Contraception Wrap Up   Current Method Abstinence    End Method Vasectomy;Female Sterilization    Contraception Counseling Provided Yes         The pregnancy intention screening data noted above was reviewed. Potential methods of contraception were discussed. The patient elected to proceed with Vasectomy; Female Sterilization.  Edinburgh Postpartum Depression Screening: H/O dep/anx, on zoloft  100mg  and buspar  bid. Says depression is good, but still very anxious, buspar  not helping. Interested in IBH. Denies SI/HI/II.   Edinburgh Postnatal  Depression Scale - 06/08/24 0958       Edinburgh Postnatal Depression Scale:  In the Past 7 Days   I have been able to laugh and see the funny side of things. 0    I have looked forward with enjoyment to things. 0    I have blamed myself unnecessarily when things went wrong. 2    I have been anxious or worried for no good reason. 3    I have felt scared or panicky for no good reason. 3    Things have been getting on top of me. 2    I have been so unhappy that I have had difficulty sleeping. 2    I have felt sad or miserable. 1    I have been so unhappy that I have been crying. 1    The thought of harming myself has occurred to me. 0    Edinburgh Postnatal Depression Scale Total 14             04/10/2024    8:47 AM 10/18/2023    2:22 PM 11/13/2022    4:17 PM 10/09/2022    1:21 PM  GAD 7 : Generalized Anxiety Score  Nervous, Anxious, on Edge 3 1 3 3   Control/stop worrying 3 1 3 3   Worry too much - different things 3 1 3 3   Trouble relaxing 3 1 3 3   Restless 2 1 3 3   Easily annoyed or irritable 2 1 3 3   Afraid - awful might happen 2 1 3 3   Total GAD 7 Score 18 7 21 21   Anxiety Difficulty  Very difficult  Extremely difficult Extremely difficult     Baby's course has been uncomplicated. Baby is feeding by bottle. Infant has a pediatrician/family doctor? Yes.  Childcare strategy if returning to work/school: n/a-working from home.  Pt has material needs met for her and baby: Yes.   Review of Systems:   Pertinent items are noted in HPI Denies Abnormal vaginal discharge w/ itching/odor/irritation, headaches, visual changes, shortness of breath, chest pain, abdominal pain, severe nausea/vomiting, or problems with urination or bowel movements. Pertinent History Reviewed:  Reviewed past medical,surgical, obstetrical and family history.  Reviewed problem list, medications and allergies. OB History  Gravida Para Term Preterm AB Living  1 1 1  0 0 1  SAB IAB Ectopic Multiple Live Births  0  0 0 0 1    # Outcome Date GA Lbr Len/2nd Weight Sex Type Anes PTL Lv  1 Term 05/02/24 [redacted]w[redacted]d / 05:07 7 lb 2.3 oz (3.24 kg) M CS-LTranv EPI  LIV   Physical Assessment:   Vitals:   06/08/24 0957  BP: 126/74  Weight: 163 lb (73.9 kg)  Height: 5' 3 (1.6 m)  Body mass index is 28.87 kg/m.       Physical Examination:   General appearance: alert, well appearing, and in no distress  Mental status: alert, oriented to person, place, and time  Skin: warm & dry   Cardiovascular: normal heart rate noted   Respiratory: normal respiratory effort, no distress   Breasts: deferred, no complaints   Abdomen: soft, non-tender, c/s incision healing well  Pelvic: VULVA: normal appearing vulva with no masses, tenderness or lesions, VAGINA: normal appearing vagina with normal color and discharge, no lesions, CERVIX: normal appearing cervix without discharge or lesions. Thin prep pap obtained: Yes  Rectal: not examined  Extremities: Edema: none   Chaperone: Latisha Cresenzo       No results found for this or any previous visit (from the past 24 hours).  Assessment & Plan:  1) Postpartum exam 2) 5 wks s/p primary cesarean section, low transverse incision 3) bottle feeding 4) Depression screening 5) Contraception counseling> wants BTS, reviewed risks/benefits, LARCs just as effective, consent signed today, abstinence until procedure 6) H/O abnormal pap> repeat today 7) Dep/anx> continue zoloft  100mg , stop buspar , rx vistaril , IBH referral  Essential components of care per ACOG recommendations:  1.  Mood and well being:  If positive depression screen, discussed and plan developed.  If using tobacco we discussed reduction/cessation and risk of relapse If current substance abuse, we discussed and referral to local resources was offered.   2. Infant care and feeding:  If breastfeeding, discussed returning to work, pumping, breastfeeding-associated pain, guidance regarding return to fertility while  lactating if not using another method. If needed, patient was provided with a letter to be allowed to pump q 2-3hrs to support lactation in a private location with access to a refrigerator to store breastmilk.   Recommended that all caregivers be immunized for flu, pertussis and other preventable communicable diseases If pt does not have material needs met for her/baby, referred to local resources for help obtaining these.  3. Sexuality, contraception and birth spacing Provided guidance regarding sexuality, management of dyspareunia, and resumption of intercourse Discussed avoiding interpregnancy interval <24mths and recommended birth spacing of 18 months  4. Sleep and fatigue Discussed coping options for fatigue and sleep disruption Encouraged family/partner/community support of 4 hrs of uninterrupted sleep to help with mood and fatigue  5. Physical recovery  If pt had a  C/S, assessed incisional pain and providing guidance on normal vs prolonged recovery If pt had a laceration, perineal healing and pain reviewed.  If urinary or fecal incontinence, discussed management and referred to PT or uro/gyn if indicated  Patient is safe to resume physical activity. Discussed attainment of healthy weight.  6.  Chronic disease management Discussed pregnancy complications if any, and their implications for future childbearing and long-term maternal health. Review recommendations for prevention of recurrent pregnancy complications, such as 17 hydroxyprogesterone caproate to reduce risk for recurrent PTB not applicable, or aspirin  to reduce risk of preeclampsia not applicable. Pt had GDM: no. If yes, 2hr GTT scheduled: not applicable. Reviewed medications and non-pregnant dosing including consideration of whether pt is breastfeeding using a reliable resource such as LactMed: not applicable Referred for f/u w/ PCP or subspecialist providers as indicated: not applicable  7. Health maintenance Mammogram at  34yo or earlier if indicated Pap smears as indicated  Meds:  Meds ordered this encounter  Medications   hydrOXYzine  (ATARAX ) 10 MG tablet    Sig: Take 1 tablet (10 mg total) by mouth 3 (three) times daily as needed.    Dispense:  30 tablet    Refill:  0    Follow-up: Return for 1st availabe appt w/ MD to schedule BTS.   Orders Placed This Encounter  Procedures   Amb ref to [facility]    Suzen JONELLE Fetters CNM, [facility] 06/08/2024 10:22 AM

## 2024-06-08 NOTE — Addendum Note (Signed)
 Addended by: ILEAN RUTHERFORD HERO on: 06/08/2024 12:49 PM   Modules accepted: Orders

## 2024-06-08 NOTE — Patient Instructions (Addendum)
 STOP the buspar , start the hydroxyzine  as needed  Surgery to Take Out One or Both Fallopian Tubes (Salpingectomy): What to Expect  Salpingectomy is surgery to remove one or both of the fallopian tubes. The fallopian tubes connect the ovaries to the uterus. You may need this surgery if: A procedure is being done on your belly or on the area between the hips (pelvis), and the health care provider thinks that removing the tubes will lower your risk for cancer. You have an ectopic pregnancy. This is when a fertilized egg attaches to the fallopian tube instead of the uterus. An ectopic pregnancy can cause the tube to burst or tear. You don't want to have children (sterilization). In this case, both tubes will be removed. You have cancer of the fallopian tube or nearby organs. You're at high risk for cancer of the ovaries. Your tube and ovary have become twisted. There are two methods that can be used to remove the fallopian tubes: An open method. One large cut is made in your belly. A laparoscopic method. Several small cuts are made in your belly. A thin, lighted camera (laparoscope) and other instruments are used to remove the fallopian tubes. The provider may also use a robot to help in surgery. Tell a health care provider about: Any allergies you have. All medicines you take. These include vitamins, herbs, eye drops, and creams. Any problems you or family members have had with anesthesia. Any bleeding problems you have. Any surgeries you've had. Any medical conditions you have. Whether you're pregnant, may be pregnant, or wish to get pregnant. What are the risks? Your provider will talk with you about risks. These may include: Infection. Bleeding. Allergic reactions to medicines. Blood clots in the legs or lungs. Damage to nearby structures or organs. What happens before the surgery? When to stop eating and drinking Eat and drink only as you've been told. You may be told this: 8 hours  before your surgery Stop eating most foods. Do not eat meat, fried foods, or fatty foods. Eat only light foods, such as toast or crackers. All liquids are OK except energy drinks and alcohol. 6 hours before your surgery Stop eating. Drink only clear liquids, such as water , clear fruit juice, black coffee, plain tea, and sports drinks. Do not drink energy drinks or alcohol. 2 hours before your surgery Stop drinking all liquids. You may be allowed to take medicines with small sips of water . If you do not eat and drink as told, your surgery may be delayed or canceled. Medicines Ask about changing or stopping: Any medicines you take. Any vitamins, herbs, or supplements you take. Do not take aspirin  or ibuprofen  unless you're told to. Surgery safety For your safety, you may: Need to wash your skin with a soap that kills germs. Get antibiotics. Have your surgery site marked. Have hair removed at the surgery site. General instructions Ask if you'll be staying overnight in the hospital. If you'll be going home right after the surgery, plan to have a responsible adult: Drive you home from the hospital or clinic. You won't be allowed to drive. Stay with you for the time you're told. What happens during the surgery? An IV will be put into a vein in your hand or arm. You may be given: A sedative to help you relax. Anesthesia to keep you from feeling pain. A small, thin tube (catheter) may be inserted through your urethra and into your bladder. This will drain pee during your surgery. Depending  on the type of surgery you're having, one cut or several small cuts will be made in your belly. Your fallopian tube (or tubes) will be cut from the ovary and uterus and removed from your body. The cut or cuts in your belly will be closed with stitches, staples, skin glue, or tape strips. A bandage may be placed over your cut or cuts. These steps may vary. Ask what you can expect. What happens after  the surgery?  You'll be watched closely until you leave. This includes checking your pain level, blood pressure, heart rate, and breathing rate. You may have to wear compression stockings to reduce swelling and help prevent blood clots in your legs. You'll be given pain medicine as needed. This information is not intended to replace advice given to you by your health care provider. Make sure you discuss any questions you have with your health care provider. Document Revised: 04/12/2023 Document Reviewed: 04/12/2023 Elsevier Patient Education  2024 [employer].

## 2024-06-13 ENCOUNTER — Ambulatory Visit: Payer: Self-pay | Admitting: Women's Health

## 2024-06-13 LAB — CYTOLOGY - PAP
Comment: NEGATIVE
Diagnosis: NEGATIVE
Diagnosis: REACTIVE
High risk HPV: NEGATIVE

## 2024-06-19 ENCOUNTER — Ambulatory Visit: Admitting: Obstetrics & Gynecology

## 2024-06-19 ENCOUNTER — Encounter: Payer: Self-pay | Admitting: Obstetrics & Gynecology

## 2024-06-19 VITALS — BP 104/68 | HR 78 | Ht 63.0 in | Wt 162.4 lb

## 2024-06-19 DIAGNOSIS — Z3009 Encounter for other general counseling and advice on contraception: Secondary | ICD-10-CM

## 2024-06-19 NOTE — Progress Notes (Signed)
 GYN VISIT Patient name: Rachel Sellers MRN [id number]  Date of birth: 1991-06-10 Chief Complaint:   Pre-op Exam  History of Present Illness:   Rachel Sellers is a 33 y.o. G53P1001 female being seen today for contraceptive management  Patient presents today with her partner to discuss contraceptive options specifically sterilization as they do not desire future pregnancy.  Patient notes history of heavy menses prior to pregnancy.  She has not yet had a period since delivery.  She reports no acute GYN concerns  Both patient and partner present-today mostly the partner had questions regarding their options  Patient's last menstrual period was 06/14/2024 (exact date).    Review of Systems:   Pertinent items are noted in HPI Denies fever/chills, dizziness, headaches, visual disturbances, fatigue, shortness of breath, chest pain, abdominal pain, vomiting, no problems with periods, bowel movements, urination, or intercourse unless otherwise stated above.  Pertinent History Reviewed:   Past Surgical History:  Procedure Laterality Date   ANAL FISTULOTOMY N/A 07/31/2022   Procedure: EXAM UNDER ANESTHESIA WITH SETON PLACEMENT;  Surgeon: Kallie Manuelita BROCKS, MD;  Location: [facility];  Service: General;  Laterality: N/A;   BIOPSY  12/22/2022   Procedure: BIOPSY;  Surgeon: Cindie Carlin POUR, DO;  Location: [facility];  Service: Endoscopy;;   CERVICAL CONIZATION W/BX Left 08/23/2019   Procedure: LASER ABLATION OF CERVIX;  Surgeon: Jayne Vonn DEL, MD;  Location: [facility];  Service: Gynecology;  Laterality: Left;   CESAREAN SECTION N/A 05/02/2024   Procedure: CESAREAN DELIVERY;  Surgeon: Cleatus Moccasin, MD;  Location: [facility];  Service: Obstetrics;  Laterality: N/A;   COLONOSCOPY WITH PROPOFOL  N/A 12/22/2022   Procedure: COLONOSCOPY WITH PROPOFOL ;  Surgeon: Cindie Carlin POUR, DO;  Location: [facility];  Service: Endoscopy;  Laterality: N/A;  2:00 pm   INCISION AND DRAINAGE ABSCESS Left 10/30/2015    Procedure: INCISION AND DRAINAGE PERI-RECTAL ABSCESS;  Surgeon: Oneil Budge, MD;  Location: [facility];  Service: General;  Laterality: Left;   INCISION AND DRAINAGE ABSCESS N/A 11/09/2022   Procedure: INCISION AND DRAINAGE PERIRECTAL ABSCESS;  Surgeon: Evonnie Dorothyann LABOR, DO;  Location: [facility];  Service: General;  Laterality: N/A;   INCISION AND DRAINAGE PERIRECTAL ABSCESS N/A 06/05/2022   Procedure: IRRIGATION AND DEBRIDEMENT PERIRECTAL ABSCESS; PENROSE DRAIN PLACED;  Surgeon: Kallie Manuelita BROCKS, MD;  Location: [facility];  Service: General;  Laterality: N/A;   LIGATION OF INTERNAL FISTULA TRACT N/A 03/05/2023   Procedure: LIGATION OF INTERNAL FISTULA TRACT;  Surgeon: Debby Hila, MD;  Location: [facility] ;  Service: General;  Laterality: N/A;   MULTIPLE TOOTH EXTRACTIONS  05/22/2022    Past Medical History:  Diagnosis Date   Anemia    Anxiety    Bipolar 1 disorder (HCC)    Depression    History of kidney stones    Hyperemesis 10/16/2023   Admit 2/1-2/2     Hypokalemia 10/16/2023   10/16/23 on admit K+ 2.5, was 3.0 on d/c, rx'd K+ po 40meq daily (didn't start) K+ 3.7 (2/3)     OCD (obsessive compulsive disorder)    PONV (postoperative nausea and vomiting)    Renal disorder    kidney stones   Vaginal Pap smear, abnormal    Reviewed problem list, medications and allergies. Physical Assessment:   Vitals:   06/19/24 1407  BP: 104/68  Pulse: 78  Weight: 162 lb 6.4 oz (73.7 kg)  Height: 5' 3 (1.6 m)  Body mass index is 28.77 kg/m.  Physical Examination:   General appearance: alert, well appearing, and in no distress  Psych: mood appropriate, normal affect  Skin: warm & dry   Cardiovascular: normal heart rate noted  Respiratory: normal respiratory effort, no distress  Pelvic: examination not indicated  Extremities: no edema   Chaperone: N/A    Assessment & Plan:  1) Contraceptive management -reviewed all contraceptive options including pills, Depot  or LARCs -risk/benefit and potential side effects of each were reviewed -Discussed vasectomy and encouraged consultation with urology - Discussed bilateral salpingectomy.   Risks of procedure discussed with patient including but not limited to: risk of regret, permanence of method, bleeding, infection, and potential injury to surrounding organs.  Also discussed possibility of post-tubal pain syndrome.  - Patient not sure how she wants to proceed, they plan to do some research on their own own - Sterilization paperwork already completed 05/2024 []  Patient to call when she has decided on contraceptive plan  No orders of the defined types were placed in this encounter.   Return for TBD.   Eustolia Drennen, DO Attending Obstetrician & Gynecologist, [facility] for [employer], [facility]

## 2024-06-22 ENCOUNTER — Telehealth: Payer: Self-pay | Admitting: Clinical

## 2024-06-22 NOTE — Telephone Encounter (Signed)
 Attempt call regarding referral; Did not leave message as female answered number provided, says you've got the wrong number.

## 2024-06-30 ENCOUNTER — Other Ambulatory Visit: Payer: Self-pay | Admitting: Family Medicine

## 2024-07-03 ENCOUNTER — Telehealth: Payer: Self-pay | Admitting: Clinical

## 2024-07-03 NOTE — Telephone Encounter (Signed)
Attempt call regarding referral; Left HIPPA-compliant message to call back Mikle Sternberg from [facility] at  [phone] (Ashelyn Mccravy's office).    

## 2024-10-11 ENCOUNTER — Ambulatory Visit: Admitting: Family Medicine
